# Patient Record
Sex: Female | Born: 1935 | Race: White | Hispanic: No | Smoking: Never smoker
Health system: Southern US, Community
[De-identification: ages and names within clinical notes are randomized; demographics above are authoritative.]

## PROBLEM LIST (undated history)

## (undated) DIAGNOSIS — E079 Disorder of thyroid, unspecified: Secondary | ICD-10-CM

## (undated) DIAGNOSIS — S069XAA Unspecified intracranial injury with loss of consciousness status unknown, initial encounter: Secondary | ICD-10-CM

## (undated) DIAGNOSIS — R55 Syncope and collapse: Secondary | ICD-10-CM

## (undated) DIAGNOSIS — R569 Unspecified convulsions: Secondary | ICD-10-CM

## (undated) DIAGNOSIS — W19XXXA Unspecified fall, initial encounter: Secondary | ICD-10-CM

## (undated) DIAGNOSIS — F0390 Unspecified dementia without behavioral disturbance: Secondary | ICD-10-CM

## (undated) DIAGNOSIS — S42309A Unspecified fracture of shaft of humerus, unspecified arm, initial encounter for closed fracture: Secondary | ICD-10-CM

## (undated) DIAGNOSIS — N289 Disorder of kidney and ureter, unspecified: Secondary | ICD-10-CM

## (undated) DIAGNOSIS — B029 Zoster without complications: Secondary | ICD-10-CM

## (undated) DIAGNOSIS — E039 Hypothyroidism, unspecified: Secondary | ICD-10-CM

## (undated) DIAGNOSIS — I10 Essential (primary) hypertension: Secondary | ICD-10-CM

## (undated) DIAGNOSIS — Y92129 Unspecified place in nursing home as the place of occurrence of the external cause: Secondary | ICD-10-CM

## (undated) DIAGNOSIS — S069X9A Unspecified intracranial injury with loss of consciousness of unspecified duration, initial encounter: Secondary | ICD-10-CM

## (undated) HISTORY — PX: CATARACT EXTRACTION: SUR2

## (undated) HISTORY — PX: ABDOMINAL HYSTERECTOMY: SHX81

---

## 2007-07-20 ENCOUNTER — Ambulatory Visit: Payer: Self-pay | Admitting: Gastroenterology

## 2011-10-20 ENCOUNTER — Ambulatory Visit: Payer: Self-pay | Admitting: Ophthalmology

## 2011-10-20 LAB — POTASSIUM: Potassium: 3.9 mmol/L (ref 3.5–5.1)

## 2011-11-16 ENCOUNTER — Ambulatory Visit: Payer: Self-pay | Admitting: Ophthalmology

## 2012-08-24 ENCOUNTER — Emergency Department: Payer: Self-pay | Admitting: Internal Medicine

## 2014-05-20 ENCOUNTER — Inpatient Hospital Stay (HOSPITAL_COMMUNITY): Payer: Medicare Other

## 2014-05-20 ENCOUNTER — Inpatient Hospital Stay (HOSPITAL_COMMUNITY)
Admission: EM | Admit: 2014-05-20 | Discharge: 2014-05-25 | DRG: 083 | Disposition: A | Discharge status: Skilled Nursing Facility | Payer: Medicare Other | Attending: Surgery | Admitting: Surgery

## 2014-05-20 ENCOUNTER — Encounter (HOSPITAL_COMMUNITY): Payer: Self-pay | Admitting: Emergency Medicine

## 2014-05-20 ENCOUNTER — Emergency Department (HOSPITAL_COMMUNITY): Payer: Medicare Other

## 2014-05-20 DIAGNOSIS — E039 Hypothyroidism, unspecified: Secondary | ICD-10-CM | POA: Diagnosis present

## 2014-05-20 DIAGNOSIS — S02109A Fracture of base of skull, unspecified side, initial encounter for closed fracture: Secondary | ICD-10-CM | POA: Diagnosis present

## 2014-05-20 DIAGNOSIS — W19XXXA Unspecified fall, initial encounter: Secondary | ICD-10-CM | POA: Diagnosis present

## 2014-05-20 DIAGNOSIS — S066X9A Traumatic subarachnoid hemorrhage with loss of consciousness of unspecified duration, initial encounter: Secondary | ICD-10-CM | POA: Diagnosis present

## 2014-05-20 DIAGNOSIS — S0511XA Contusion of eyeball and orbital tissues, right eye, initial encounter: Secondary | ICD-10-CM | POA: Diagnosis not present

## 2014-05-20 DIAGNOSIS — S0292XA Unspecified fracture of facial bones, initial encounter for closed fracture: Secondary | ICD-10-CM

## 2014-05-20 DIAGNOSIS — S0003XA Contusion of scalp, initial encounter: Secondary | ICD-10-CM | POA: Diagnosis not present

## 2014-05-20 DIAGNOSIS — S0181XA Laceration without foreign body of other part of head, initial encounter: Secondary | ICD-10-CM | POA: Diagnosis not present

## 2014-05-20 DIAGNOSIS — R40241 Glasgow coma scale score 13-15: Secondary | ICD-10-CM | POA: Diagnosis present

## 2014-05-20 DIAGNOSIS — S069X9A Unspecified intracranial injury with loss of consciousness of unspecified duration, initial encounter: Secondary | ICD-10-CM | POA: Diagnosis present

## 2014-05-20 DIAGNOSIS — S065X1A Traumatic subdural hemorrhage with loss of consciousness of 30 minutes or less, initial encounter: Secondary | ICD-10-CM

## 2014-05-20 DIAGNOSIS — S0101XA Laceration without foreign body of scalp, initial encounter: Secondary | ICD-10-CM | POA: Diagnosis not present

## 2014-05-20 DIAGNOSIS — S069X4D Unspecified intracranial injury with loss of consciousness of 6 hours to 24 hours, subsequent encounter: Secondary | ICD-10-CM | POA: Diagnosis not present

## 2014-05-20 DIAGNOSIS — I10 Essential (primary) hypertension: Secondary | ICD-10-CM | POA: Diagnosis present

## 2014-05-20 DIAGNOSIS — S0210XA Unspecified fracture of base of skull, initial encounter for closed fracture: Secondary | ICD-10-CM | POA: Diagnosis present

## 2014-05-20 DIAGNOSIS — R4701 Aphasia: Secondary | ICD-10-CM | POA: Diagnosis present

## 2014-05-20 DIAGNOSIS — S069XAA Unspecified intracranial injury with loss of consciousness status unknown, initial encounter: Secondary | ICD-10-CM | POA: Diagnosis present

## 2014-05-20 HISTORY — DX: Disorder of thyroid, unspecified: E07.9

## 2014-05-20 HISTORY — DX: Zoster without complications: B02.9

## 2014-05-20 HISTORY — DX: Essential (primary) hypertension: I10

## 2014-05-20 LAB — COMPREHENSIVE METABOLIC PANEL
ALBUMIN: 3.6 g/dL (ref 3.5–5.2)
ALT: 28 U/L (ref 0–35)
ANION GAP: 5 (ref 5–15)
AST: 46 U/L — AB (ref 0–37)
Alkaline Phosphatase: 46 U/L (ref 39–117)
BUN: 18 mg/dL (ref 6–23)
CALCIUM: 9.1 mg/dL (ref 8.4–10.5)
CO2: 30 mmol/L (ref 19–32)
Chloride: 102 mmol/L (ref 96–112)
Creatinine, Ser: 1.25 mg/dL — ABNORMAL HIGH (ref 0.50–1.10)
GFR calc Af Amer: 43 mL/min — ABNORMAL LOW (ref >90)
GFR, EST NON AFRICAN AMERICAN: 37 mL/min — AB (ref >90)
Glucose, Bld: 120 mg/dL — ABNORMAL HIGH (ref 70–99)
Potassium: 4 mmol/L (ref 3.5–5.1)
SODIUM: 137 mmol/L (ref 135–145)
Total Bilirubin: 0.3 mg/dL (ref 0.3–1.2)
Total Protein: 6.3 g/dL (ref 6.0–8.3)

## 2014-05-20 LAB — TYPE AND SCREEN
ABO/RH(D): O POS
ANTIBODY SCREEN: NEGATIVE
Unit division: 0
Unit division: 0

## 2014-05-20 LAB — CBC
HCT: 40 % (ref 36.0–46.0)
Hemoglobin: 13 g/dL (ref 12.0–15.0)
MCH: 29.7 pg (ref 26.0–34.0)
MCHC: 32.5 g/dL (ref 30.0–36.0)
MCV: 91.5 fL (ref 78.0–100.0)
PLATELETS: 264 10*3/uL (ref 150–400)
RBC: 4.37 MIL/uL (ref 3.87–5.11)
RDW: 14 % (ref 11.5–15.5)
WBC: 8.5 10*3/uL (ref 4.0–10.5)

## 2014-05-20 LAB — I-STAT CHEM 8, ED
BUN: 21 mg/dL (ref 6–23)
CALCIUM ION: 1.17 mmol/L (ref 1.13–1.30)
Chloride: 100 mmol/L (ref 96–112)
Creatinine, Ser: 1.2 mg/dL — ABNORMAL HIGH (ref 0.50–1.10)
GLUCOSE: 119 mg/dL — AB (ref 70–99)
HCT: 43 % (ref 36.0–46.0)
Hemoglobin: 14.6 g/dL (ref 12.0–15.0)
Potassium: 4 mmol/L (ref 3.5–5.1)
SODIUM: 139 mmol/L (ref 135–145)
TCO2: 24 mmol/L (ref 0–100)

## 2014-05-20 LAB — MRSA PCR SCREENING: MRSA BY PCR: NEGATIVE

## 2014-05-20 LAB — ABO/RH: ABO/RH(D): O POS

## 2014-05-20 LAB — I-STAT CG4 LACTIC ACID, ED: Lactic Acid, Venous: 1.52 mmol/L (ref 0.5–2.0)

## 2014-05-20 LAB — PROTIME-INR
INR: 1.06 (ref 0.00–1.49)
Prothrombin Time: 13.9 seconds (ref 11.6–15.2)

## 2014-05-20 LAB — ETHANOL

## 2014-05-20 LAB — CDS SEROLOGY

## 2014-05-20 MED ORDER — ONDANSETRON HCL 4 MG/2ML IJ SOLN
4.0000 mg | Freq: Once | INTRAMUSCULAR | Status: AC
  Administered 2014-05-20: 4 mg via INTRAVENOUS

## 2014-05-20 MED ORDER — MORPHINE SULFATE 2 MG/ML IJ SOLN: 2.0000 mg | INTRAMUSCULAR | Status: DC | PRN

## 2014-05-20 MED ORDER — FLUORESCEIN SODIUM 1 MG OP STRP: 1.0000 | ORAL_STRIP | Freq: Once | OPHTHALMIC | Status: DC

## 2014-05-20 MED ORDER — ONDANSETRON HCL 4 MG/2ML IJ SOLN
4.0000 mg | Freq: Four times a day (QID) | INTRAMUSCULAR | Status: DC | PRN
  Administered 2014-05-20: 4 mg via INTRAVENOUS
  Filled 2014-05-20: qty 2

## 2014-05-20 MED ORDER — TETANUS-DIPHTH-ACELL PERTUSSIS 5-2.5-18.5 LF-MCG/0.5 IM SUSP
0.5000 mL | Freq: Once | INTRAMUSCULAR | Status: AC
  Administered 2014-05-20: 0.5 mL via INTRAMUSCULAR
  Filled 2014-05-20: qty 0.5

## 2014-05-20 MED ORDER — PANTOPRAZOLE SODIUM 40 MG PO TBEC
40.0000 mg | DELAYED_RELEASE_TABLET | Freq: Every day | ORAL | Status: DC
  Administered 2014-05-24 – 2014-05-25 (×2): 40 mg via ORAL
  Filled 2014-05-20 (×2): qty 1

## 2014-05-20 MED ORDER — PANTOPRAZOLE SODIUM 40 MG IV SOLR
40.0000 mg | Freq: Every day | INTRAVENOUS | Status: DC
  Administered 2014-05-20 – 2014-05-23 (×4): 40 mg via INTRAVENOUS
  Filled 2014-05-20 (×6): qty 40

## 2014-05-20 MED ORDER — TETRACAINE HCL 0.5 % OP SOLN
2.0000 [drp] | Freq: Once | OPHTHALMIC | Status: AC
  Administered 2014-05-20: 2 [drp] via OPHTHALMIC
  Filled 2014-05-20: qty 2

## 2014-05-20 MED ORDER — MORPHINE SULFATE 4 MG/ML IJ SOLN: 4.0000 mg | INTRAMUSCULAR | Status: DC | PRN

## 2014-05-20 MED ORDER — POTASSIUM CHLORIDE IN NACL 20-0.9 MEQ/L-% IV SOLN
INTRAVENOUS | Status: DC
  Administered 2014-05-20 – 2014-05-23 (×5): via INTRAVENOUS
  Filled 2014-05-20 (×9): qty 1000

## 2014-05-20 MED ORDER — ONDANSETRON HCL 4 MG PO TABS: 4.0000 mg | ORAL_TABLET | Freq: Four times a day (QID) | ORAL | Status: DC | PRN

## 2014-05-20 NOTE — H&P (Addendum)
Tracy Reeves is an 81 y.o. female.   Chief Complaint: fall  HPI: Tracy Reeves was found down at the bottom of 3 steps in her car port. She was unresponsive when found. EMS was called and they transported her as a level one trauma. On arrival, she is arousable and answers questions. She is unable to contribute much to her past medical or past surgical history.  No past medical history on file.  No past surgical history on file.  No family history on file. Social History:  has no tobacco, alcohol, and drug history on file.  Allergies: Allergies not on file   (Not in a hospital admission)  Results for orders placed or performed during the hospital encounter of 05/20/14 (from the past 48 hour(s))  Type and screen     Status: None (Preliminary result)   Collection Time: 05/20/14  3:35 PM  Result Value Ref Range   ABO/RH(D) PENDING    Antibody Screen PENDING    Sample Expiration 05/23/2014    Unit Number [id number]    Blood Component Type RED CELLS,LR    Unit division 00    Status of Unit ISSUED    Unit tag comment VERBAL ORDERS PER DR WOFFORD    Transfusion Status OK TO TRANSFUSE    Crossmatch Result PENDING    Unit Number [id number]    Blood Component Type RED CELLS,LR    Unit division 00    Status of Unit ISSUED    Unit tag comment VERBAL ORDERS PER DR [id number]    Transfusion Status OK TO TRANSFUSE    Crossmatch Result PENDING   Prepare fresh frozen plasma     Status: None (Preliminary result)   Collection Time: 05/20/14  3:35 PM  Result Value Ref Range   Unit Number [id number]    Blood Component Type THW PLS APHR    Unit division 00    Status of Unit ISSUED    Transfusion Status OK TO TRANSFUSE    Unit Number [id number]    Blood Component Type THAWED PLASMA    Unit division 00    Status of Unit ISSUED    Transfusion Status OK TO TRANSFUSE   CBC     Status: None   Collection Time: 05/20/14  4:01 PM  Result Value Ref Range   WBC 8.5 4.0 - 10.5 K/uL   RBC 4.37  3.87 - 5.11 MIL/uL   Hemoglobin 13.0 12.0 - 15.0 g/dL   HCT 40.0 36.0 - 46.0 %   MCV 91.5 78.0 - 100.0 fL   MCH 29.7 26.0 - 34.0 pg   MCHC 32.5 30.0 - 36.0 g/dL   RDW 14.0 11.5 - 15.5 %   Platelets 264 150 - 400 K/uL  I-stat chem 8, ed     Status: Abnormal   Collection Time: 05/20/14  4:11 PM  Result Value Ref Range   Sodium 139 135 - 145 mmol/L   Potassium 4.0 3.5 - 5.1 mmol/L   Chloride 100 96 - 112 mmol/L   BUN 21 6 - 23 mg/dL   Creatinine, Ser 1.20 (H) 0.50 - 1.10 mg/dL   Glucose, Bld 119 (H) 70 - 99 mg/dL   Calcium, Ion 1.17 1.13 - 1.30 mmol/L   TCO2 24 0 - 100 mmol/L   Hemoglobin 14.6 12.0 - 15.0 g/dL   HCT 43.0 36.0 - 46.0 %  I-Stat CG4 Lactic Acid, ED     Status: None   Collection Time: 05/20/14  4:12 PM  Result Value Ref  Range   Lactic Acid, Venous 1.52 0.5 - 2.0 mmol/L   Dg Pelvis Portable  05/20/2014   CLINICAL DATA:  Level 1 trauma  EXAM: PORTABLE PELVIS 1-2 VIEWS  COMPARISON:  None.  FINDINGS: Degenerative changes in the hips bilaterally with joint space narrowing and spurring. Degenerative spurring scratch head degenerative changes in the visualized lower lumbar spine. No fracture, subluxation or dislocation.  IMPRESSION: No acute bony abnormality.   Electronically Signed   By: Tracy Reeves M.D.   On: 05/20/2014 16:44   Dg Chest Portable 1 View  05/20/2014   CLINICAL DATA:  Level 1 trauma.  EXAM: PORTABLE CHEST - 1 VIEW  COMPARISON:  None.  FINDINGS: Elevation of the right hemidiaphragm. Low lung volumes. Heart is borderline in size. No confluent airspace opacities. No effusions. No acute bony abnormality.  IMPRESSION: Elevated right hemidiaphragm.  No acute findings.   Electronically Signed   By: Tracy Reeves M.D.   On: 05/20/2014 16:44    Review of Systems  Unable to perform ROS: mental status change    Blood pressure 151/61, pulse 76, temperature 98.7 F (37.1 C), temperature source Oral, resp. rate 18, height 5\' 2"  (1.575 m), weight 145 lb (65.772 kg), SpO2 100  %. Physical Exam  Constitutional: She appears well-developed and well-nourished.  HENT:  Head: Head is with contusion and with laceration.    Right Ear: Tympanic membrane, external ear and ear canal normal. No hemotympanum.  Left Ear: Tympanic membrane, external ear and ear canal normal. No hemotympanum.  Nose: No sinus tenderness or nasal deformity.  Mouth/Throat: Uvula is midline and oropharynx is clear and moist.  Large right scalp hematoma with abrasion, 1 cm right forehead laceration, significant right periorbital ecchymosis with some proptosis  Eyes: EOM are normal. Pupils are equal, round, and reactive to light. Right eye exhibits no discharge. Left eye exhibits no discharge.  Right periorbital ecchymosis as above, right proptosis, vision seems grossly intact in both eyes but she does not answer questions well, small right subconjunctival hemorrhage  Neck: No tracheal deviation present.  No posterior midline tenderness, no pain on active range of motion  Cardiovascular: Normal rate, regular rhythm and normal heart sounds.   Respiratory: Effort normal and breath sounds normal. No stridor. No respiratory distress. She has no wheezes. She has no rales.  GI: Soft. Bowel sounds are normal. She exhibits no distension. There is no tenderness. There is no rebound and no guarding.  Musculoskeletal: Normal range of motion. She exhibits no edema or tenderness.  Neurological: She displays no atrophy and no tremor. She exhibits normal muscle tone. She displays no seizure activity. GCS eye subscore is 3. GCS verbal subscore is 4. GCS motor subscore is 6.  Opens eyes to voice, speech is confused at times, follows commands and moves all 4 extremities   Skin: Skin is warm.     Assessment/Plan Fall TBI/right frontal ICC/L parietal ICC/L temporal ICC at middle cranial fossa - Dr. Kathyrn Reeves to see in consultation R max sinus and skull base FXs - check maxillofacial CT, Dr. Migdalia Reeves to see in AM Skull  base FX with associated hematoma at R orbital apex - opthalmology consult R forehead lac and scalp hematoma - lac closed with LiquiBand  Admit to ICU. Will try to get PMHx from family. Critical care 87min Tracy Reeves E 05/20/2014, 4:58 PM

## 2014-05-20 NOTE — Progress Notes (Signed)
Patient ID: Tracy Reeves, female   DOB: February 20, 1923, 81 y.o.   MRN: [id number] B eye pressures checked by EDP resident: R15 L14. I D/W Dr. Eddie North from optho and she said nothing needs to be done at this time. [name], MD, MPH, FACS Trauma: [id number] General Surgery: [phone]

## 2014-05-20 NOTE — ED Provider Notes (Signed)
CSN: [id number]     Arrival date & time 05/20/14  1553 History   First MD Initiated Contact with Patient 05/20/14 [phone]     Chief Complaint  Patient presents with  . Fall    level 1 trauma     (Consider location/radiation/quality/duration/timing/severity/associated sxs/prior Treatment) HPI Comments: 81 yo F with unknown PMHx who presents as a Level I trauma code following fall. Per EMS, the patient was found down at the bottom of 3 stairs in her carport. Per EMS, patient was minimally responsive at that time but breathing spontaneously, with GCS 4-6 en route. There was moderate amount of blood loss at the scene. On arrival, patient able to answer her name but unable to provide further history.  Patient is a 81 y.o. female presenting with trauma. The history is provided by the EMS personnel.  Trauma Mechanism of injury: fall Injury location: head/neck Injury location detail: head and scalp Incident location: home   Fall:      Fall occurred: From 3 steps.      Impact surface: hard floor      Point of impact: head  EMS/PTA data:      Bystander interventions: none      Ambulatory at scene: no      Blood loss: moderate      Responsiveness: unresponsive      Loss of consciousness: yes  Current symptoms:      Associated symptoms:            Reports loss of consciousness.    Past Medical History  Diagnosis Date  . Hypertension   . Shingles    History reviewed. No pertinent past surgical history. No family history on file. History  Substance Use Topics  . Smoking status: Never Smoker   . Smokeless tobacco: Not on file  . Alcohol Use: Not on file   OB History    No data available     Review of Systems  Unable to perform ROS: Mental status change  Neurological: Positive for loss of consciousness.      Allergies  Review of patient's allergies indicates no known allergies.  Home Medications   Prior to Admission medications   Medication Sig Start Date End Date  Taking? Authorizing Provider  amLODipine (NORVASC) 5 MG tablet Take 5 mg by mouth daily.   Yes Historical Provider, MD  levothyroxine (SYNTHROID, LEVOTHROID) 25 MCG tablet Take 25 mcg by mouth daily before breakfast.   Yes Historical Provider, MD  LORazepam (ATIVAN) 0.5 MG tablet Take 0.5 mg by mouth 3 (three) times daily as needed for anxiety.   Yes Historical Provider, MD  losartan (COZAAR) 50 MG tablet Take 50 mg by mouth daily.   Yes Historical Provider, MD  nystatin-triamcinolone (MYCOLOG II) cream Apply 1 application topically 3 (three) times daily as needed (vaginal burning).   Yes Historical Provider, MD  PARoxetine (PAXIL) 10 MG tablet Take 10 mg by mouth at bedtime.   Yes Historical Provider, MD  traMADol (ULTRAM) 50 MG tablet Take 50 mg by mouth every 6 (six) hours as needed (pain).   Yes Historical Provider, MD   BP 137/60 mmHg  Pulse 90  Temp(Src) 99.3 F (37.4 C) (Axillary)  Resp 15  Ht 5' (1.524 m)  Wt 119 lb 0.8 oz (54 kg)  BMI 23.25 kg/m2  SpO2 100% Physical Exam  Constitutional: She appears well-developed and well-nourished. Cervical collar and backboard in place.  HENT:  Small laceration noted to the right temporal scalp with  associated bogginess. No active bleeding. No occipital bogginess or step-offs. Midface stable. Bilateral periorbital ecchymoses noted. No periauricular ecchymoses. Dried blood in bilateral nares with no nasal septal hematoma. No hemotympanum bilaterally. No apparent dental trauma. No proptosis. Small amount of blood noted in lateral lid margin. No active bleeding. PERRL.   Eyes: Conjunctivae are normal. Pupils are equal, round, and reactive to light.  Neck: No tracheal deviation present.  Hard cervical collar in place  Cardiovascular: Normal rate and normal heart sounds.  Exam reveals no friction rub.   No murmur heard. Pulmonary/Chest: Effort normal and breath sounds normal. No respiratory distress. She has no wheezes. She has no rales.  No chest  wall tenderness to palpation. No bruising or ecchymosis.  Abdominal: Soft. She exhibits no distension. There is no tenderness.  Musculoskeletal: She exhibits no edema.  Pelvis stable to AP and lateral compression. No apparent trauma or deformity to the upper or lower extremities bilaterally  Neurological: She is alert. She displays no tremor. She exhibits normal muscle tone. She displays no seizure activity. GCS eye subscore is 3. GCS verbal subscore is 4. GCS motor subscore is 6.  Skin: Skin is warm. No rash noted.    ED Course  Procedures (including critical care time) Labs Review Labs Reviewed  COMPREHENSIVE METABOLIC PANEL - Abnormal; Notable for the following:    Glucose, Bld 120 (*)    Creatinine, Ser 1.25 (*)    AST 46 (*)    GFR calc non Af Amer 37 (*)    GFR calc Af Amer 43 (*)    All other components within normal limits  I-STAT CHEM 8, ED - Abnormal; Notable for the following:    Creatinine, Ser 1.20 (*)    Glucose, Bld 119 (*)    All other components within normal limits  MRSA PCR SCREENING  CDS SEROLOGY  CBC  ETHANOL  PROTIME-INR  CBC  PROTIME-INR  BASIC METABOLIC PANEL  I-STAT CG4 LACTIC ACID, ED  TYPE AND SCREEN  PREPARE FRESH FROZEN PLASMA  ABO/RH    Imaging Review Ct Head Wo Contrast  05/20/2014   CLINICAL DATA:  Fall down 3 steps in her garage, landed head 1st on concrete floor, found unresponsive, right head laceration with bruising to right eye  EXAM: CT HEAD WITHOUT CONTRAST  CT CERVICAL SPINE WITHOUT CONTRAST  TECHNIQUE: Multidetector CT imaging of the head and cervical spine was performed following the standard protocol without intravenous contrast. Multiplanar CT image reconstructions of the cervical spine were also generated.  COMPARISON:  None.  FINDINGS: CT HEAD FINDINGS  Extracranial hematoma overlying the right frontal bone (series 3/image 22).  Underlying parenchymal contusion/subarachnoid hemorrhage in the right frontal lobe (series 3/image 20)  with associated parenchymal contusion in the left parietal lobe (series 3/ image 17).  Additional subarachnoid hemorrhage in the left frontal lobe (series 3/ image 19).  Additional layering hemorrhage/parenchymal contusion in the left upper lobe/ left middle cranial fossa (series 3/images 8-9).  Layering fluid/ hemorrhage in the right maxillary sinus (series 3/image 3). Suspected associated nondisplaced fracture involving the anterior wall of the right maxillary sinus (series 4/image 4).  Additional hemorrhage in the right orbital apex (series 3/ image 6) with associated proptosis of the right globe. Associated fracture involving the zygomatic arch. Fracture involving the right frontal bone (series 4/image 41) and extending through the greater wing of the sphenoid (series 4/ images 10-14).  Involvement of the bilateral sphenoid sinuses with associated hemorrhage.  No mass lesion, mass effect,  or midline shift.  No CT evidence of acute infarction.  Basilar cisterns are patent.  Small vessel ischemic changes.  Intracranial atherosclerosis.  Cerebral volume is age appropriate.  No ventriculomegaly.  No evidence of intraventricular hemorrhage.  CT CERVICAL SPINE FINDINGS  Normal cervical lordosis.  No evidence of fracture or dislocation. Vertebral body heights are maintained. Dens appears intact.  No prevertebral soft tissue swelling.  4 mm anterolisthesis of C4 on C5.  3 mm anterolisthesis of C5 on C6.  Moderate multilevel degenerative changes.  IMPRESSION: Hematoma in the right orbital apex with associated proptosis.  Fractures involving the right zygomatic arch, right frontal bone, and extending through the greater wing of the sphenoid. Associated fractures involving the right maxillary sinus and bilateral sphenoid sinuses.  Extracranial hematoma overlying the right frontal bone.  Underlying parenchymal contusion in the right frontal lobe, left parietal lobe, and left temporal lobe/middle cranial fossa. Additional  subarachnoid hemorrhage in the left frontal lobe.  No evidence of acute traumatic injury to the cervical spine.  Critical Value/emergent results were discussed in person at the time of interpretation on 05/20/2014 at 4:35 pm to Dr. [name], who verbally acknowledged these results.   Electronically Signed   By: Julian Hy M.D.   On: 05/20/2014 17:05   Ct Cervical Spine Wo Contrast  05/20/2014   CLINICAL DATA:  Fall down 3 steps in her garage, landed head 1st on concrete floor, found unresponsive, right head laceration with bruising to right eye  EXAM: CT HEAD WITHOUT CONTRAST  CT CERVICAL SPINE WITHOUT CONTRAST  TECHNIQUE: Multidetector CT imaging of the head and cervical spine was performed following the standard protocol without intravenous contrast. Multiplanar CT image reconstructions of the cervical spine were also generated.  COMPARISON:  None.  FINDINGS: CT HEAD FINDINGS  Extracranial hematoma overlying the right frontal bone (series 3/image 22).  Underlying parenchymal contusion/subarachnoid hemorrhage in the right frontal lobe (series 3/image 20) with associated parenchymal contusion in the left parietal lobe (series 3/ image 17).  Additional subarachnoid hemorrhage in the left frontal lobe (series 3/ image 19).  Additional layering hemorrhage/parenchymal contusion in the left upper lobe/ left middle cranial fossa (series 3/images 8-9).  Layering fluid/ hemorrhage in the right maxillary sinus (series 3/image 3). Suspected associated nondisplaced fracture involving the anterior wall of the right maxillary sinus (series 4/image 4).  Additional hemorrhage in the right orbital apex (series 3/ image 6) with associated proptosis of the right globe. Associated fracture involving the zygomatic arch. Fracture involving the right frontal bone (series 4/image 41) and extending through the greater wing of the sphenoid (series 4/ images 10-14).  Involvement of the bilateral sphenoid sinuses with  associated hemorrhage.  No mass lesion, mass effect, or midline shift.  No CT evidence of acute infarction.  Basilar cisterns are patent.  Small vessel ischemic changes.  Intracranial atherosclerosis.  Cerebral volume is age appropriate.  No ventriculomegaly.  No evidence of intraventricular hemorrhage.  CT CERVICAL SPINE FINDINGS  Normal cervical lordosis.  No evidence of fracture or dislocation. Vertebral body heights are maintained. Dens appears intact.  No prevertebral soft tissue swelling.  4 mm anterolisthesis of C4 on C5.  3 mm anterolisthesis of C5 on C6.  Moderate multilevel degenerative changes.  IMPRESSION: Hematoma in the right orbital apex with associated proptosis.  Fractures involving the right zygomatic arch, right frontal bone, and extending through the greater wing of the sphenoid. Associated fractures involving the right maxillary sinus and bilateral sphenoid sinuses.  Extracranial hematoma  overlying the right frontal bone.  Underlying parenchymal contusion in the right frontal lobe, left parietal lobe, and left temporal lobe/middle cranial fossa. Additional subarachnoid hemorrhage in the left frontal lobe.  No evidence of acute traumatic injury to the cervical spine.  Critical Value/emergent results were discussed in person at the time of interpretation on 05/20/2014 at 4:35 pm to Dr. [name], who verbally acknowledged these results.   Electronically Signed   By: Julian Hy M.D.   On: 05/20/2014 17:05   Dg Pelvis Portable  05/20/2014   CLINICAL DATA:  Level 1 trauma  EXAM: PORTABLE PELVIS 1-2 VIEWS  COMPARISON:  None.  FINDINGS: Degenerative changes in the hips bilaterally with joint space narrowing and spurring. Degenerative spurring scratch head degenerative changes in the visualized lower lumbar spine. No fracture, subluxation or dislocation.  IMPRESSION: No acute bony abnormality.   Electronically Signed   By: Rolm Baptise M.D.   On: 05/20/2014 16:44   Dg Chest Portable 1  View  05/20/2014   CLINICAL DATA:  Level 1 trauma.  EXAM: PORTABLE CHEST - 1 VIEW  COMPARISON:  None.  FINDINGS: Elevation of the right hemidiaphragm. Low lung volumes. Heart is borderline in size. No confluent airspace opacities. No effusions. No acute bony abnormality.  IMPRESSION: Elevated right hemidiaphragm.  No acute findings.   Electronically Signed   By: Rolm Baptise M.D.   On: 05/20/2014 16:44   Ct Maxillofacial Wo Cm  05/20/2014   CLINICAL DATA:  Found down and unresponsive. Maxillofacial fractures noted on the CT of the head and cervical spine. Further evaluation requested. Initial encounter.  EXAM: CT MAXILLOFACIAL WITHOUT CONTRAST  TECHNIQUE: Multidetector CT imaging of the maxillofacial structures was performed. Multiplanar CT image reconstructions were also generated. A small metallic BB was placed on the right temple in order to reliably differentiate right from left.  COMPARISON:  None.  FINDINGS: As noted on recent CT of the head and cervical spine, there is a minimally displaced tetrapod fracture involving the right zygomaticomaxillary complex. The superior buttress fracture extends across the suture, while there is a mildly displaced fracture through the right orbital floor and superior aspect of the lateral wall of the right maxillary sinus. The fracture extends across the anterior wall of the right maxillary sinus. There is a minimally displaced fracture extending across the right zygomatic arch, and as described on prior CT, there is extension of a fracture line across the base of the skull, primarily on the right, though also extending to the left, passing through the sphenoid sinus on both sides. This does not appear to involve the more posterior vascular foramina.  There is right-sided proptosis. Underlying intraorbital hematoma and air is seen tracking along the posterior aspect of the orbit, measuring perhaps 1.9 x 0.5 x 0.8 cm, with poor characterization of the adjacent inferior  rectus muscle. The right optic globe appears grossly intact. The left orbit is unremarkable in appearance.  Blood is noted filling the right maxillary sinus and sphenoid sinus, and partially filling the nasal passages. The remaining paranasal sinuses and mastoid air cells are well aerated.  The mandible appears intact. The nasal bone is unremarkable in appearance. The visualized dentition demonstrates no acute abnormality.  Intraparenchymal hemorrhage at the left temporal lobe is better defined than on the prior study, and appears to have increased mildly in size, measuring 1.9 x 1.7 cm. Mild left-sided subdural blood is suggested along the tentorium cerebelli. Parenchymal contusion is again noted along the left parietal lobe.  Mild soft tissue swelling is noted about the right orbit, and overlying the right maxilla, with minimal soft tissue air overlying the right maxilla. The parapharyngeal fat planes are preserved. The nasopharynx, oropharynx and hypopharynx are unremarkable in appearance. The visualized portions of the valleculae and piriform sinuses are grossly unremarkable.  The parotid and visualized portions of the submandibular glands are within normal limits. No cervical lymphadenopathy is seen.  IMPRESSION: 1. Minimally displaced tetrapod fracture of the right zygomaticomaxillary complex, described in further detail above, with blood filling the right maxillary sinus and sphenoid sinus, and partially filling the nasal passages. 2. Fracture line extending across the base of the skull, more prominent on the right, though also extending to the left, passing through the sphenoid sinus on both sides. 3. Right-sided proptosis noted. This reflects underlying intraorbital hematoma and air tracking along the posterior aspect of the orbit, measuring approximately 1.9 x 0.5 x 0.8 cm, with poor characterization of the adjacent posterior aspect of the inferior rectus muscle, though the anterior aspect of the muscle  appears intact. 4. Intraparenchymal hemorrhage at the left temporal lobe is better defined, and appears to have increased mildly in size, measuring 1.9 x 1.7 cm. 5. Mild left-sided subdural blood noted along the tentorium cerebelli. 6. Parenchymal contusion again noted along the left parietal lobe. 7. Mild soft tissue swelling about the right orbit, and overlying the right maxilla, with minimal associated soft tissue air overlying the right maxilla.  These results were called by telephone at the time of interpretation on 05/20/2014 at 5:49 pm to Dr. Roxanne Gates, who verbally acknowledged these results.   Electronically Signed   By: Garald Balding M.D.   On: 05/20/2014 17:50     EKG Interpretation   Date/Time:  Sunday May 20 2014 16:00:12 EST Ventricular Rate:  74 PR Interval:  140 QRS Duration: 96 QT Interval:  387 QTC Calculation: 429 R Axis:   -10 Text Interpretation:  Sinus rhythm Borderline T wave abnormalities No old  tracing to compare Confirmed by [facility]  MD, TREY ([id number]) on 05/20/2014  5:51:11 PM      MDM   81 year old female with unknown past medical history who presents after unwitnessed fall and loss of consciousness from height of 3 feet. See HPI above. On arrival, primary survey intact - airway intact and protected, breath sounds equal BL, PIV x 2 established, normal manual BP, symmetric pulses, and GCS 13. Secondary as above, remarkable for multiple sites of trauma to the face and head with no apparent other trauma. Trauma Surgery at bedside on arrival and throughout evaluation. Will send for stat CT Head, C-Spine. Portable CXR unremarkable with no PTX.  CT C-Spine without acute fx or abnormality. CT Head with severe TBI with SAH, IPH, multiple facial fx,traumatic right orbital hematoma. Lac closed at bedside by Trauma. Ophtho, NSG consulted. IOP checked given retrobulbar hematoma with IOP 15 OD, 14 OS (averaged over 3 measurements). Trauma at bedside while checking IOP. Globe  soft and appears intact. Will admit to Trauma Surgery. Pt remains GCS 13-14, protecting airway, with no indication for intubation at this time. BP controlled. Labs reviewed as above. INR normal, platelets WNL, no known anticoagulant use.  Clinical Impression: 1. Fall, initial encounter   2. Facial fracture due to fall   3. TBI (traumatic brain injury)   4. Subdural hematoma, post-traumatic, with loss of consciousness of 30 minutes or less, initial encounter   5. Traumatic orbital hematoma, right, initial encounter   6. Multiple  facial fractures, closed, initial encounter   7. Scalp laceration, initial encounter   8. Scalp hematoma, initial encounter   9. Traumatic subarachnoid hemorrhage with loss of consciousness, unspecified duration, initial encounter     Disposition: Admit  Condition: Stable  Pt seen in conjunction with Dr. Cleone Slim, MD 05/21/14 0109  Houston Siren III, MD 05/21/14 [phone]

## 2014-05-20 NOTE — ED Notes (Addendum)
Pt returned from CT. Pt vomited coming back to room. Pt cleaned and changed. 4mg  zofran given IV per verbal order by ER resident.

## 2014-05-20 NOTE — ED Notes (Signed)
Pt transported to CT ?

## 2014-05-20 NOTE — ED Notes (Signed)
Family at bedside. sts pt was dx with Shingles 5 weeks ago.

## 2014-05-20 NOTE — Progress Notes (Signed)
Spoke with Dr. Grandville Silos about pt vomiting. No new orders given. Will continue to monitor.

## 2014-05-20 NOTE — ED Notes (Signed)
Chaplin given family contact info.

## 2014-05-20 NOTE — ED Provider Notes (Addendum)
I saw and evaluated the patient, reviewed the resident's note and I agree with the findings and plan.   EKG Interpretation   Date/Time:  Sunday May 20 2014 16:00:12 EST Ventricular Rate:  74 PR Interval:  140 QRS Duration: 96 QT Interval:  387 QTC Calculation: 429 R Axis:   -10 Text Interpretation:  Sinus rhythm Borderline T wave abnormalities No old  tracing to compare Confirmed by [facility]  MD, TREY ([id number]) on 05/20/2014  5:51:11 PM        CRITICAL CARE Performed by: Arbie Cookey   Total critical care time: 35  Critical care time was exclusive of separately billable procedures and treating other patients.  Critical care was necessary to treat or prevent imminent or life-threatening deterioration.  Critical care was time spent personally by me on the following activities: development of treatment plan with patient and/or surrogate as well as nursing, discussions with consultants, evaluation of patient's response to treatment, examination of patient, obtaining history from patient or surrogate, ordering and performing treatments and interventions, ordering and review of laboratory studies, ordering and review of radiographic studies, pulse oximetry and re-evaluation of patient's condition.   81 yo female presenting as level I trauma secondary to unwitnessed fall with decreased GCS.  On exam, ABCs intact, GCS 13 (3,4,6), lungs CTAB, heart sounds normal with RRR, abd soft an nontender, head with contusion and laceration to right parietal region.  Plan CT head/C Spine.    CT showed intracranial hemorrhage, facial and skull fractures.  Trauma evaluated from initial arrival.  NSU and Ophtho also consulted.  Admitted.    Clinical Impression: 1. Facial fracture due to fall   2. Facial fracture due to fall   3. TBI (traumatic brain injury)   Intracranial hemorrhage Retrobulbar hematoma    Houston Siren III, MD 05/20/14 [place] III,  MD 05/21/14 [phone]

## 2014-05-20 NOTE — Consult Note (Signed)
CC:  Chief Complaint  Patient presents with  . Fall    level 1 trauma    HPI: Tracy Reeves is a 81 y.o. female seen in the ICU after suffering a fall at home. History is obtained from the EMR as the patient is confused, and unable to provide meaningful history. She apparently was found down at her home at the bottom of 3 steps. She was unresponsive upon EMS arrival, but was awake and answering some questions in the ED.  PMH: Past Medical History  Diagnosis Date  . Hypertension   . Shingles     PSH: History reviewed. No pertinent past surgical history.  SH: History  Substance Use Topics  . Smoking status: Not on file  . Smokeless tobacco: Not on file  . Alcohol Use: Not on file    MEDS: Prior to Admission medications   Medication Sig Start Date End Date Taking? Authorizing Provider  amLODipine (NORVASC) 5 MG tablet Take 5 mg by mouth daily.   Yes Historical Provider, MD  levothyroxine (SYNTHROID, LEVOTHROID) 25 MCG tablet Take 25 mcg by mouth daily before breakfast.   Yes Historical Provider, MD  LORazepam (ATIVAN) 0.5 MG tablet Take 0.5 mg by mouth 3 (three) times daily as needed for anxiety.   Yes Historical Provider, MD  losartan (COZAAR) 50 MG tablet Take 50 mg by mouth daily.   Yes Historical Provider, MD  nystatin-triamcinolone (MYCOLOG II) cream Apply 1 application topically 3 (three) times daily as needed (vaginal burning).   Yes Historical Provider, MD  PARoxetine (PAXIL) 10 MG tablet Take 10 mg by mouth at bedtime.   Yes Historical Provider, MD  traMADol (ULTRAM) 50 MG tablet Take 50 mg by mouth every 6 (six) hours as needed (pain).   Yes Historical Provider, MD    ALLERGY: No Known Allergies  ROS:  Review of Systems  Unable to perform ROS: mental status change    NEUROLOGIC EXAM: Awake, alert, not oriented to hospital nor year GCS [id number] CN grossly intact Motor exam: Upper Extremities Deltoid Bicep Tricep Grip  Right 5/5 5/5 5/5 5/5  Left 5/5 5/5 5/5  5/5   Lower Extremity IP Quad PF DF EHL  Right 5/5 5/5 5/5 5/5 5/5  Left 5/5 5/5 5/5 5/5 5/5   Sensation grossly intact to LT  IMGAING: CTH demonstrates left inferior temporal contusions, and small amount of bilateral convexity SAH. No HCP. There is a non-displaced right temporal bone fracture.  CT Cspine does not demonstrate any acute fractures / subluxations  IMPRESSION: - 81 y.o. female s/p fall with left temporal contusions  PLAN: - Repeat CTH in the am - Cont observation in ICU

## 2014-05-20 NOTE — ED Notes (Signed)
Pt returned to CT. 

## 2014-05-20 NOTE — Progress Notes (Signed)
Pt on 3L [place]. Denies SOB. SPO2 97%. RT will continue to monitor.

## 2014-05-21 ENCOUNTER — Inpatient Hospital Stay (HOSPITAL_COMMUNITY): Payer: Medicare Other

## 2014-05-21 ENCOUNTER — Encounter (HOSPITAL_COMMUNITY): Payer: Self-pay | Admitting: Physician Assistant

## 2014-05-21 LAB — CBC
HEMATOCRIT: 39.9 % (ref 36.0–46.0)
HEMOGLOBIN: 13.1 g/dL (ref 12.0–15.0)
MCH: 30.2 pg (ref 26.0–34.0)
MCHC: 32.8 g/dL (ref 30.0–36.0)
MCV: 91.9 fL (ref 78.0–100.0)
Platelets: 212 10*3/uL (ref 150–400)
RBC: 4.34 MIL/uL (ref 3.87–5.11)
RDW: 13.9 % (ref 11.5–15.5)
WBC: 14.2 10*3/uL — ABNORMAL HIGH (ref 4.0–10.5)

## 2014-05-21 LAB — BASIC METABOLIC PANEL
ANION GAP: 12 (ref 5–15)
BUN: 13 mg/dL (ref 6–23)
CHLORIDE: 101 mmol/L (ref 96–112)
CO2: 21 mmol/L (ref 19–32)
CREATININE: 0.98 mg/dL (ref 0.50–1.10)
Calcium: 8.5 mg/dL (ref 8.4–10.5)
GFR, EST AFRICAN AMERICAN: 58 mL/min — AB (ref >90)
GFR, EST NON AFRICAN AMERICAN: 50 mL/min — AB (ref >90)
Glucose, Bld: 205 mg/dL — ABNORMAL HIGH (ref 70–99)
Potassium: 4.1 mmol/L (ref 3.5–5.1)
Sodium: 134 mmol/L — ABNORMAL LOW (ref 135–145)

## 2014-05-21 LAB — PREPARE FRESH FROZEN PLASMA
Unit division: 0
Unit division: 0

## 2014-05-21 LAB — PROTIME-INR
INR: 1.1 (ref 0.00–1.49)
Prothrombin Time: 14.4 seconds (ref 11.6–15.2)

## 2014-05-21 MED ORDER — CETYLPYRIDINIUM CHLORIDE 0.05 % MT LIQD
7.0000 mL | Freq: Two times a day (BID) | OROMUCOSAL | Status: DC
  Administered 2014-05-21 (×2): 7 mL via OROMUCOSAL

## 2014-05-21 NOTE — Progress Notes (Signed)
Speech Language Pathology  Patient Details Name: Tracy Reeves MRN: [id number] DOB: June 07, 1922 Today's Date: 05/21/2014 Time:  -       Received order for swallow cognitive assessment. Will initiate 3/1   Houston Siren M.Ed [employer] [phone]

## 2014-05-21 NOTE — Progress Notes (Signed)
Trauma Service Note  Subjective: Patient doing okay.  Agitated and not oriented.  Will not follow commands.  Objective: Vital signs in last 24 hours: Temp:  [98.7 F (37.1 C)-100.4 F (38 C)] 100.4 F (38 C) (02/29 0350) Pulse Rate:  [75-92] 89 (02/29 0800) Resp:  [14-30] 17 (02/29 0800) BP: (122-156)/(50-80) 139/58 mmHg (02/29 0800) SpO2:  [91 %-100 %] 99 % (02/29 0800) Weight:  [54 kg (119 lb 0.8 oz)-65.772 kg (145 lb)] 54 kg (119 lb 0.8 oz) (02/28 2000)    Intake/Output from previous day: 02/28 0701 - 02/29 0700 In: 732.5 [I.V.:732.5] Out: 1700 [Urine:1700] Intake/Output this shift: Total I/O In: 75 [I.V.:75] Out: -   General: No aucte distress but agitated and disoriented.  Lungs: Clear  Abd: Benign and soft  Extremities: No clinical signs or symptoms of DVT  Neuro: Intact  Lab Results: CBC   Recent Labs  05/20/14 1601 05/20/14 1611 05/21/14 0244  WBC 8.5  --  14.2*  HGB 13.0 14.6 13.1  HCT 40.0 43.0 39.9  PLT 264  --  212   BMET  Recent Labs  05/20/14 1601 05/20/14 1611 05/21/14 0244  NA 137 139 134*  K 4.0 4.0 4.1  CL 102 100 101  CO2 30  --  21  GLUCOSE 120* 119* 205*  BUN 18 21 13   CREATININE 1.25* 1.20* 0.98  CALCIUM 9.1  --  8.5   PT/INR  Recent Labs  05/20/14 1601 05/21/14 0244  LABPROT 13.9 14.4  INR 1.06 1.10   ABG No results for input(s): PHART, HCO3 in the last 72 hours.  Invalid input(s): PCO2, PO2  Studies/Results: Ct Head Without Contrast  05/21/2014   CLINICAL DATA:  Followup traumatic brain injury.  EXAM: CT HEAD WITHOUT CONTRAST  TECHNIQUE: Contiguous axial images were obtained from the base of the skull through the vertex without intravenous contrast.  COMPARISON:  Head CT from 1 day prior  FINDINGS: Skull and Sinuses:Right parietal calvarial fracture shows no interval displacement/depression. A central skullbase fracture, traversing the right pterion and sphenoid sinuses is unchanged. No visible pneumocephalus.  Right zygomaticomaxillary complex fracture, right orbital hemorrhage, and right proptosis are stable from previous.  Brain: As expected, blooming of bilateral temporal lobe hemorrhagic contusions, with increased hemorrhage and surrounding edema. The contusion is more extensive on the left, with the largest hemorrhagic area measuring up to 2.5 cm diameter. There is no significant increase in mass effect. No herniation.  Scattered small volume subarachnoid hemorrhage is similar to previous, best visualized around the cerebral convexities. There is no hydrocephalus.  Thin subdural hematoma along the left tentorium and in the left occipital region is stable. The left occipital clot measures up to 8 mm in thickness.  No evidence of herniation, hydrocephalus, or infarct. There is brain atrophy and chronic small vessel disease, mild for age.  IMPRESSION: 1. Left greater than right temporal lobe contusions with progressive hemorrhage and edema. No midline shift or herniation. 2. Similar small volume subarachnoid and subdural hemorrhage. 3. Calvarial, skullbase, and facial fractures as previously described.   Electronically Signed   By: Monte Fantasia M.D.   On: 05/21/2014 06:11   Ct Head Wo Contrast  05/20/2014   CLINICAL DATA:  Fall down 3 steps in her garage, landed head 1st on concrete floor, found unresponsive, right head laceration with bruising to right eye  EXAM: CT HEAD WITHOUT CONTRAST  CT CERVICAL SPINE WITHOUT CONTRAST  TECHNIQUE: Multidetector CT imaging of the head and cervical spine was  performed following the standard protocol without intravenous contrast. Multiplanar CT image reconstructions of the cervical spine were also generated.  COMPARISON:  None.  FINDINGS: CT HEAD FINDINGS  Extracranial hematoma overlying the right frontal bone (series 3/image 22).  Underlying parenchymal contusion/subarachnoid hemorrhage in the right frontal lobe (series 3/image 20) with associated parenchymal contusion in the  left parietal lobe (series 3/ image 17).  Additional subarachnoid hemorrhage in the left frontal lobe (series 3/ image 19).  Additional layering hemorrhage/parenchymal contusion in the left upper lobe/ left middle cranial fossa (series 3/images 8-9).  Layering fluid/ hemorrhage in the right maxillary sinus (series 3/image 3). Suspected associated nondisplaced fracture involving the anterior wall of the right maxillary sinus (series 4/image 4).  Additional hemorrhage in the right orbital apex (series 3/ image 6) with associated proptosis of the right globe. Associated fracture involving the zygomatic arch. Fracture involving the right frontal bone (series 4/image 41) and extending through the greater wing of the sphenoid (series 4/ images 10-14).  Involvement of the bilateral sphenoid sinuses with associated hemorrhage.  No mass lesion, mass effect, or midline shift.  No CT evidence of acute infarction.  Basilar cisterns are patent.  Small vessel ischemic changes.  Intracranial atherosclerosis.  Cerebral volume is age appropriate.  No ventriculomegaly.  No evidence of intraventricular hemorrhage.  CT CERVICAL SPINE FINDINGS  Normal cervical lordosis.  No evidence of fracture or dislocation. Vertebral body heights are maintained. Dens appears intact.  No prevertebral soft tissue swelling.  4 mm anterolisthesis of C4 on C5.  3 mm anterolisthesis of C5 on C6.  Moderate multilevel degenerative changes.  IMPRESSION: Hematoma in the right orbital apex with associated proptosis.  Fractures involving the right zygomatic arch, right frontal bone, and extending through the greater wing of the sphenoid. Associated fractures involving the right maxillary sinus and bilateral sphenoid sinuses.  Extracranial hematoma overlying the right frontal bone.  Underlying parenchymal contusion in the right frontal lobe, left parietal lobe, and left temporal lobe/middle cranial fossa. Additional subarachnoid hemorrhage in the left frontal  lobe.  No evidence of acute traumatic injury to the cervical spine.  Critical Value/emergent results were discussed in person at the time of interpretation on 05/20/2014 at 4:35 pm to Dr. [name], who verbally acknowledged these results.   Electronically Signed   By: Julian Hy M.D.   On: 05/20/2014 17:05   Ct Cervical Spine Wo Contrast  05/20/2014   CLINICAL DATA:  Fall down 3 steps in her garage, landed head 1st on concrete floor, found unresponsive, right head laceration with bruising to right eye  EXAM: CT HEAD WITHOUT CONTRAST  CT CERVICAL SPINE WITHOUT CONTRAST  TECHNIQUE: Multidetector CT imaging of the head and cervical spine was performed following the standard protocol without intravenous contrast. Multiplanar CT image reconstructions of the cervical spine were also generated.  COMPARISON:  None.  FINDINGS: CT HEAD FINDINGS  Extracranial hematoma overlying the right frontal bone (series 3/image 22).  Underlying parenchymal contusion/subarachnoid hemorrhage in the right frontal lobe (series 3/image 20) with associated parenchymal contusion in the left parietal lobe (series 3/ image 17).  Additional subarachnoid hemorrhage in the left frontal lobe (series 3/ image 19).  Additional layering hemorrhage/parenchymal contusion in the left upper lobe/ left middle cranial fossa (series 3/images 8-9).  Layering fluid/ hemorrhage in the right maxillary sinus (series 3/image 3). Suspected associated nondisplaced fracture involving the anterior wall of the right maxillary sinus (series 4/image 4).  Additional hemorrhage in the right orbital apex (series 3/  image 6) with associated proptosis of the right globe. Associated fracture involving the zygomatic arch. Fracture involving the right frontal bone (series 4/image 41) and extending through the greater wing of the sphenoid (series 4/ images 10-14).  Involvement of the bilateral sphenoid sinuses with associated hemorrhage.  No mass lesion, mass effect,  or midline shift.  No CT evidence of acute infarction.  Basilar cisterns are patent.  Small vessel ischemic changes.  Intracranial atherosclerosis.  Cerebral volume is age appropriate.  No ventriculomegaly.  No evidence of intraventricular hemorrhage.  CT CERVICAL SPINE FINDINGS  Normal cervical lordosis.  No evidence of fracture or dislocation. Vertebral body heights are maintained. Dens appears intact.  No prevertebral soft tissue swelling.  4 mm anterolisthesis of C4 on C5.  3 mm anterolisthesis of C5 on C6.  Moderate multilevel degenerative changes.  IMPRESSION: Hematoma in the right orbital apex with associated proptosis.  Fractures involving the right zygomatic arch, right frontal bone, and extending through the greater wing of the sphenoid. Associated fractures involving the right maxillary sinus and bilateral sphenoid sinuses.  Extracranial hematoma overlying the right frontal bone.  Underlying parenchymal contusion in the right frontal lobe, left parietal lobe, and left temporal lobe/middle cranial fossa. Additional subarachnoid hemorrhage in the left frontal lobe.  No evidence of acute traumatic injury to the cervical spine.  Critical Value/emergent results were discussed in person at the time of interpretation on 05/20/2014 at 4:35 pm to Dr. [name], who verbally acknowledged these results.   Electronically Signed   By: Julian Hy M.D.   On: 05/20/2014 17:05   Dg Pelvis Portable  05/20/2014   CLINICAL DATA:  Level 1 trauma  EXAM: PORTABLE PELVIS 1-2 VIEWS  COMPARISON:  None.  FINDINGS: Degenerative changes in the hips bilaterally with joint space narrowing and spurring. Degenerative spurring scratch head degenerative changes in the visualized lower lumbar spine. No fracture, subluxation or dislocation.  IMPRESSION: No acute bony abnormality.   Electronically Signed   By: Rolm Baptise M.D.   On: 05/20/2014 16:44   Dg Chest Portable 1 View  05/20/2014   CLINICAL DATA:  Level 1 trauma.   EXAM: PORTABLE CHEST - 1 VIEW  COMPARISON:  None.  FINDINGS: Elevation of the right hemidiaphragm. Low lung volumes. Heart is borderline in size. No confluent airspace opacities. No effusions. No acute bony abnormality.  IMPRESSION: Elevated right hemidiaphragm.  No acute findings.   Electronically Signed   By: Rolm Baptise M.D.   On: 05/20/2014 16:44   Ct Maxillofacial Wo Cm  05/20/2014   CLINICAL DATA:  Found down and unresponsive. Maxillofacial fractures noted on the CT of the head and cervical spine. Further evaluation requested. Initial encounter.  EXAM: CT MAXILLOFACIAL WITHOUT CONTRAST  TECHNIQUE: Multidetector CT imaging of the maxillofacial structures was performed. Multiplanar CT image reconstructions were also generated. A small metallic BB was placed on the right temple in order to reliably differentiate right from left.  COMPARISON:  None.  FINDINGS: As noted on recent CT of the head and cervical spine, there is a minimally displaced tetrapod fracture involving the right zygomaticomaxillary complex. The superior buttress fracture extends across the suture, while there is a mildly displaced fracture through the right orbital floor and superior aspect of the lateral wall of the right maxillary sinus. The fracture extends across the anterior wall of the right maxillary sinus. There is a minimally displaced fracture extending across the right zygomatic arch, and as described on prior CT, there is extension of  a fracture line across the base of the skull, primarily on the right, though also extending to the left, passing through the sphenoid sinus on both sides. This does not appear to involve the more posterior vascular foramina.  There is right-sided proptosis. Underlying intraorbital hematoma and air is seen tracking along the posterior aspect of the orbit, measuring perhaps 1.9 x 0.5 x 0.8 cm, with poor characterization of the adjacent inferior rectus muscle. The right optic globe appears grossly  intact. The left orbit is unremarkable in appearance.  Blood is noted filling the right maxillary sinus and sphenoid sinus, and partially filling the nasal passages. The remaining paranasal sinuses and mastoid air cells are well aerated.  The mandible appears intact. The nasal bone is unremarkable in appearance. The visualized dentition demonstrates no acute abnormality.  Intraparenchymal hemorrhage at the left temporal lobe is better defined than on the prior study, and appears to have increased mildly in size, measuring 1.9 x 1.7 cm. Mild left-sided subdural blood is suggested along the tentorium cerebelli. Parenchymal contusion is again noted along the left parietal lobe.  Mild soft tissue swelling is noted about the right orbit, and overlying the right maxilla, with minimal soft tissue air overlying the right maxilla. The parapharyngeal fat planes are preserved. The nasopharynx, oropharynx and hypopharynx are unremarkable in appearance. The visualized portions of the valleculae and piriform sinuses are grossly unremarkable.  The parotid and visualized portions of the submandibular glands are within normal limits. No cervical lymphadenopathy is seen.  IMPRESSION: 1. Minimally displaced tetrapod fracture of the right zygomaticomaxillary complex, described in further detail above, with blood filling the right maxillary sinus and sphenoid sinus, and partially filling the nasal passages. 2. Fracture line extending across the base of the skull, more prominent on the right, though also extending to the left, passing through the sphenoid sinus on both sides. 3. Right-sided proptosis noted. This reflects underlying intraorbital hematoma and air tracking along the posterior aspect of the orbit, measuring approximately 1.9 x 0.5 x 0.8 cm, with poor characterization of the adjacent posterior aspect of the inferior rectus muscle, though the anterior aspect of the muscle appears intact. 4. Intraparenchymal hemorrhage at the  left temporal lobe is better defined, and appears to have increased mildly in size, measuring 1.9 x 1.7 cm. 5. Mild left-sided subdural blood noted along the tentorium cerebelli. 6. Parenchymal contusion again noted along the left parietal lobe. 7. Mild soft tissue swelling about the right orbit, and overlying the right maxilla, with minimal associated soft tissue air overlying the right maxilla.  These results were called by telephone at the time of interpretation on 05/20/2014 at 5:49 pm to Dr. Roxanne Gates, who verbally acknowledged these results.   Electronically Signed   By: Garald Balding M.D.   On: 05/20/2014 17:50    Anti-infectives: Anti-infectives    None      Assessment/Plan: s/p  TBI team evaluation.  Disposition right now looking likel SNF or Rehab  LOS: 1 day   Kathryne Eriksson. Dahlia Bailiff, MD, FACS [phone] Trauma Surgeon 05/21/2014

## 2014-05-21 NOTE — Consult Note (Signed)
Reason for Consult: Facial fractures Referring Physician: Dr. [name]  Tracy Reeves is an 81 y.o. female.  HPI: Tracy Reeves is an 81 yo female who was found unresponsive at the bottom of her steps in her carport. She was brought in as a Level one trauma and found to have TBI, multiple facial fractures and basilar skull fractures. We are asked to assess her facial fractures.   Her daughter is present during the exam and reports that the patient was living independently and still driving.  She has a right peri-orbital contusion with edema and ecchymosis. She is opening her eyes, but unable to speak at this time. She is unable to follow commands.  Her CT scan showed:  Minimally displaced tetrapod fracture of the right zygomaticomaxillary complex, with blood filling the right maxillary sinus and sphenoid sinus, and blood partially filling the nasal passages. Basilar skull fracture line extending across the base of the skull, more prominent on the right, though also extending to the left, passing through the sphenoid sinus on both sides. Past Medical History  Diagnosis Date  . Hypertension   . Shingles     History reviewed. No pertinent past surgical history.  History reviewed. No pertinent family history.  Social History:  reports that she has never smoked. She does not have any smokeless tobacco history on file. Her alcohol and drug histories are not on file.  Allergies: No Known Allergies  Medications: I have reviewed the patient's current medications.  Results for orders placed or performed during the hospital encounter of 05/20/14 (from the past 48 hour(s))  Prepare fresh frozen plasma     Status: None   Collection Time: 05/20/14  3:35 PM  Result Value Ref Range   Unit Number [id number]    Blood Component Type THW PLS APHR    Unit division 00    Status of Unit REL FROM [facility]    Transfusion Status OK TO TRANSFUSE    Unit Number [id number]    Blood Component  Type THAWED PLASMA    Unit division 00    Status of Unit REL FROM [facility]    Transfusion Status OK TO TRANSFUSE   CDS serology     Status: None   Collection Time: 05/20/14  4:01 PM  Result Value Ref Range   CDS serology specimen      SPECIMEN WILL BE HELD FOR 14 DAYS IF TESTING IS REQUIRED  Comprehensive metabolic panel     Status: Abnormal   Collection Time: 05/20/14  4:01 PM  Result Value Ref Range   Sodium 137 135 - 145 mmol/L   Potassium 4.0 3.5 - 5.1 mmol/L   Chloride 102 96 - 112 mmol/L   CO2 30 19 - 32 mmol/L   Glucose, Bld 120 (H) 70 - 99 mg/dL   BUN 18 6 - 23 mg/dL   Creatinine, Ser 1.25 (H) 0.50 - 1.10 mg/dL   Calcium 9.1 8.4 - 10.5 mg/dL   Total Protein 6.3 6.0 - 8.3 g/dL   Albumin 3.6 3.5 - 5.2 g/dL   AST 46 (H) 0 - 37 U/L   ALT 28 0 - 35 U/L   Alkaline Phosphatase 46 39 - 117 U/L   Total Bilirubin 0.3 0.3 - 1.2 mg/dL   GFR calc non Af Amer 37 (L) >90 mL/min   GFR calc Af Amer 43 (L) >90 mL/min    Comment: (NOTE) The eGFR has been calculated using the CKD EPI equation. This calculation has not been validated  in all clinical situations. eGFR's persistently <90 mL/min signify possible Chronic Kidney Disease.    Anion gap 5 5 - 15  CBC     Status: None   Collection Time: 05/20/14  4:01 PM  Result Value Ref Range   WBC 8.5 4.0 - 10.5 K/uL   RBC 4.37 3.87 - 5.11 MIL/uL   Hemoglobin 13.0 12.0 - 15.0 g/dL   HCT 40.0 36.0 - 46.0 %   MCV 91.5 78.0 - 100.0 fL   MCH 29.7 26.0 - 34.0 pg   MCHC 32.5 30.0 - 36.0 g/dL   RDW 14.0 11.5 - 15.5 %   Platelets 264 150 - 400 K/uL  Ethanol     Status: None   Collection Time: 05/20/14  4:01 PM  Result Value Ref Range   Alcohol, Ethyl (B) <5 0 - 9 mg/dL    Comment:        LOWEST DETECTABLE LIMIT FOR SERUM ALCOHOL IS 11 mg/dL FOR MEDICAL PURPOSES ONLY   Protime-INR     Status: None   Collection Time: 05/20/14  4:01 PM  Result Value Ref Range   Prothrombin Time 13.9 11.6 - 15.2 seconds   INR 1.06 0.00 - 1.49  Type and  screen     Status: None   Collection Time: 05/20/14  4:04 PM  Result Value Ref Range   ABO/RH(D) O POS    Antibody Screen NEG    Sample Expiration 05/23/2014    Unit Number [id number]    Blood Component Type RED CELLS,LR    Unit division 00    Status of Unit REL FROM [facility]    Unit tag comment VERBAL ORDERS PER DR WOFFORD    Transfusion Status OK TO TRANSFUSE    Crossmatch Result NOT NEEDED    Unit Number [id number]    Blood Component Type RED CELLS,LR    Unit division 00    Status of Unit REL FROM [facility]    Unit tag comment VERBAL ORDERS PER DR WOFFORD    Transfusion Status OK TO TRANSFUSE    Crossmatch Result NOT NEEDED   ABO/Rh     Status: None   Collection Time: 05/20/14  4:04 PM  Result Value Ref Range   ABO/RH(D) O POS   I-stat chem 8, ed     Status: Abnormal   Collection Time: 05/20/14  4:11 PM  Result Value Ref Range   Sodium 139 135 - 145 mmol/L   Potassium 4.0 3.5 - 5.1 mmol/L   Chloride 100 96 - 112 mmol/L   BUN 21 6 - 23 mg/dL   Creatinine, Ser 1.20 (H) 0.50 - 1.10 mg/dL   Glucose, Bld 119 (H) 70 - 99 mg/dL   Calcium, Ion 1.17 1.13 - 1.30 mmol/L   TCO2 24 0 - 100 mmol/L   Hemoglobin 14.6 12.0 - 15.0 g/dL   HCT 43.0 36.0 - 46.0 %  I-Stat CG4 Lactic Acid, ED     Status: None   Collection Time: 05/20/14  4:12 PM  Result Value Ref Range   Lactic Acid, Venous 1.52 0.5 - 2.0 mmol/L  MRSA PCR Screening     Status: None   Collection Time: 05/20/14  7:02 PM  Result Value Ref Range   MRSA by PCR NEGATIVE NEGATIVE    Comment:        The GeneXpert MRSA Assay (FDA approved for NASAL specimens only), is one component of a comprehensive MRSA colonization surveillance program. It is not intended to diagnose MRSA infection  nor to guide or monitor treatment for MRSA infections.   CBC     Status: Abnormal   Collection Time: 05/21/14  2:44 AM  Result Value Ref Range   WBC 14.2 (H) 4.0 - 10.5 K/uL   RBC 4.34 3.87 - 5.11 MIL/uL   Hemoglobin 13.1 12.0 - 15.0 g/dL    HCT 39.9 36.0 - 46.0 %   MCV 91.9 78.0 - 100.0 fL   MCH 30.2 26.0 - 34.0 pg   MCHC 32.8 30.0 - 36.0 g/dL   RDW 13.9 11.5 - 15.5 %   Platelets 212 150 - 400 K/uL  Protime-INR     Status: None   Collection Time: 05/21/14  2:44 AM  Result Value Ref Range   Prothrombin Time 14.4 11.6 - 15.2 seconds   INR 1.10 0.00 - 5.27  Basic metabolic panel     Status: Abnormal   Collection Time: 05/21/14  2:44 AM  Result Value Ref Range   Sodium 134 (L) 135 - 145 mmol/L   Potassium 4.1 3.5 - 5.1 mmol/L   Chloride 101 96 - 112 mmol/L   CO2 21 19 - 32 mmol/L   Glucose, Bld 205 (H) 70 - 99 mg/dL   BUN 13 6 - 23 mg/dL   Creatinine, Ser 0.98 0.50 - 1.10 mg/dL   Calcium 8.5 8.4 - 10.5 mg/dL   GFR calc non Af Amer 50 (L) >90 mL/min   GFR calc Af Amer 58 (L) >90 mL/min    Comment: (NOTE) The eGFR has been calculated using the CKD EPI equation. This calculation has not been validated in all clinical situations. eGFR's persistently <90 mL/min signify possible Chronic Kidney Disease.    Anion gap 12 5 - 15    Ct Head Without Contrast  05/21/2014   CLINICAL DATA:  Followup traumatic brain injury.  EXAM: CT HEAD WITHOUT CONTRAST  TECHNIQUE: Contiguous axial images were obtained from the base of the skull through the vertex without intravenous contrast.  COMPARISON:  Head CT from 1 day prior  FINDINGS: Skull and Sinuses:Right parietal calvarial fracture shows no interval displacement/depression. A central skullbase fracture, traversing the right pterion and sphenoid sinuses is unchanged. No visible pneumocephalus. Right zygomaticomaxillary complex fracture, right orbital hemorrhage, and right proptosis are stable from previous.  Brain: As expected, blooming of bilateral temporal lobe hemorrhagic contusions, with increased hemorrhage and surrounding edema. The contusion is more extensive on the left, with the largest hemorrhagic area measuring up to 2.5 cm diameter. There is no significant increase in mass  effect. No herniation.  Scattered small volume subarachnoid hemorrhage is similar to previous, best visualized around the cerebral convexities. There is no hydrocephalus.  Thin subdural hematoma along the left tentorium and in the left occipital region is stable. The left occipital clot measures up to 8 mm in thickness.  No evidence of herniation, hydrocephalus, or infarct. There is brain atrophy and chronic small vessel disease, mild for age.  IMPRESSION: 1. Left greater than right temporal lobe contusions with progressive hemorrhage and edema. No midline shift or herniation. 2. Similar small volume subarachnoid and subdural hemorrhage. 3. Calvarial, skullbase, and facial fractures as previously described.   Electronically Signed   By: Monte Fantasia M.D.   On: 05/21/2014 06:11   Ct Head Wo Contrast  05/20/2014   CLINICAL DATA:  Fall down 3 steps in her garage, landed head 1st on concrete floor, found unresponsive, right head laceration with bruising to right eye  EXAM: CT HEAD WITHOUT CONTRAST  CT CERVICAL SPINE WITHOUT CONTRAST  TECHNIQUE: Multidetector CT imaging of the head and cervical spine was performed following the standard protocol without intravenous contrast. Multiplanar CT image reconstructions of the cervical spine were also generated.  COMPARISON:  None.  FINDINGS: CT HEAD FINDINGS  Extracranial hematoma overlying the right frontal bone (series 3/image 22).  Underlying parenchymal contusion/subarachnoid hemorrhage in the right frontal lobe (series 3/image 20) with associated parenchymal contusion in the left parietal lobe (series 3/ image 17).  Additional subarachnoid hemorrhage in the left frontal lobe (series 3/ image 19).  Additional layering hemorrhage/parenchymal contusion in the left upper lobe/ left middle cranial fossa (series 3/images 8-9).  Layering fluid/ hemorrhage in the right maxillary sinus (series 3/image 3). Suspected associated nondisplaced fracture involving the anterior wall  of the right maxillary sinus (series 4/image 4).  Additional hemorrhage in the right orbital apex (series 3/ image 6) with associated proptosis of the right globe. Associated fracture involving the zygomatic arch. Fracture involving the right frontal bone (series 4/image 41) and extending through the greater wing of the sphenoid (series 4/ images 10-14).  Involvement of the bilateral sphenoid sinuses with associated hemorrhage.  No mass lesion, mass effect, or midline shift.  No CT evidence of acute infarction.  Basilar cisterns are patent.  Small vessel ischemic changes.  Intracranial atherosclerosis.  Cerebral volume is age appropriate.  No ventriculomegaly.  No evidence of intraventricular hemorrhage.  CT CERVICAL SPINE FINDINGS  Normal cervical lordosis.  No evidence of fracture or dislocation. Vertebral body heights are maintained. Dens appears intact.  No prevertebral soft tissue swelling.  4 mm anterolisthesis of C4 on C5.  3 mm anterolisthesis of C5 on C6.  Moderate multilevel degenerative changes.  IMPRESSION: Hematoma in the right orbital apex with associated proptosis.  Fractures involving the right zygomatic arch, right frontal bone, and extending through the greater wing of the sphenoid. Associated fractures involving the right maxillary sinus and bilateral sphenoid sinuses.  Extracranial hematoma overlying the right frontal bone.  Underlying parenchymal contusion in the right frontal lobe, left parietal lobe, and left temporal lobe/middle cranial fossa. Additional subarachnoid hemorrhage in the left frontal lobe.  No evidence of acute traumatic injury to the cervical spine.  Critical Value/emergent results were discussed in person at the time of interpretation on 05/20/2014 at 4:35 pm to Dr. [name], who verbally acknowledged these results.   Electronically Signed   By: Julian Hy M.D.   On: 05/20/2014 17:05   Ct Cervical Spine Wo Contrast  05/20/2014   CLINICAL DATA:  Fall down 3 steps  in her garage, landed head 1st on concrete floor, found unresponsive, right head laceration with bruising to right eye  EXAM: CT HEAD WITHOUT CONTRAST  CT CERVICAL SPINE WITHOUT CONTRAST  TECHNIQUE: Multidetector CT imaging of the head and cervical spine was performed following the standard protocol without intravenous contrast. Multiplanar CT image reconstructions of the cervical spine were also generated.  COMPARISON:  None.  FINDINGS: CT HEAD FINDINGS  Extracranial hematoma overlying the right frontal bone (series 3/image 22).  Underlying parenchymal contusion/subarachnoid hemorrhage in the right frontal lobe (series 3/image 20) with associated parenchymal contusion in the left parietal lobe (series 3/ image 17).  Additional subarachnoid hemorrhage in the left frontal lobe (series 3/ image 19).  Additional layering hemorrhage/parenchymal contusion in the left upper lobe/ left middle cranial fossa (series 3/images 8-9).  Layering fluid/ hemorrhage in the right maxillary sinus (series 3/image 3). Suspected associated nondisplaced fracture involving the anterior wall of  the right maxillary sinus (series 4/image 4).  Additional hemorrhage in the right orbital apex (series 3/ image 6) with associated proptosis of the right globe. Associated fracture involving the zygomatic arch. Fracture involving the right frontal bone (series 4/image 41) and extending through the greater wing of the sphenoid (series 4/ images 10-14).  Involvement of the bilateral sphenoid sinuses with associated hemorrhage.  No mass lesion, mass effect, or midline shift.  No CT evidence of acute infarction.  Basilar cisterns are patent.  Small vessel ischemic changes.  Intracranial atherosclerosis.  Cerebral volume is age appropriate.  No ventriculomegaly.  No evidence of intraventricular hemorrhage.  CT CERVICAL SPINE FINDINGS  Normal cervical lordosis.  No evidence of fracture or dislocation. Vertebral body heights are maintained. Dens appears  intact.  No prevertebral soft tissue swelling.  4 mm anterolisthesis of C4 on C5.  3 mm anterolisthesis of C5 on C6.  Moderate multilevel degenerative changes.  IMPRESSION: Hematoma in the right orbital apex with associated proptosis.  Fractures involving the right zygomatic arch, right frontal bone, and extending through the greater wing of the sphenoid. Associated fractures involving the right maxillary sinus and bilateral sphenoid sinuses.  Extracranial hematoma overlying the right frontal bone.  Underlying parenchymal contusion in the right frontal lobe, left parietal lobe, and left temporal lobe/middle cranial fossa. Additional subarachnoid hemorrhage in the left frontal lobe.  No evidence of acute traumatic injury to the cervical spine.  Critical Value/emergent results were discussed in person at the time of interpretation on 05/20/2014 at 4:35 pm to Dr. [name], who verbally acknowledged these results.   Electronically Signed   By: Julian Hy M.D.   On: 05/20/2014 17:05   Dg Pelvis Portable  05/20/2014   CLINICAL DATA:  Level 1 trauma  EXAM: PORTABLE PELVIS 1-2 VIEWS  COMPARISON:  None.  FINDINGS: Degenerative changes in the hips bilaterally with joint space narrowing and spurring. Degenerative spurring scratch head degenerative changes in the visualized lower lumbar spine. No fracture, subluxation or dislocation.  IMPRESSION: No acute bony abnormality.   Electronically Signed   By: Rolm Baptise M.D.   On: 05/20/2014 16:44   Dg Chest Portable 1 View  05/20/2014   CLINICAL DATA:  Level 1 trauma.  EXAM: PORTABLE CHEST - 1 VIEW  COMPARISON:  None.  FINDINGS: Elevation of the right hemidiaphragm. Low lung volumes. Heart is borderline in size. No confluent airspace opacities. No effusions. No acute bony abnormality.  IMPRESSION: Elevated right hemidiaphragm.  No acute findings.   Electronically Signed   By: Rolm Baptise M.D.   On: 05/20/2014 16:44   Ct Maxillofacial Wo Cm  05/20/2014    CLINICAL DATA:  Found down and unresponsive. Maxillofacial fractures noted on the CT of the head and cervical spine. Further evaluation requested. Initial encounter.  EXAM: CT MAXILLOFACIAL WITHOUT CONTRAST  TECHNIQUE: Multidetector CT imaging of the maxillofacial structures was performed. Multiplanar CT image reconstructions were also generated. A small metallic BB was placed on the right temple in order to reliably differentiate right from left.  COMPARISON:  None.  FINDINGS: As noted on recent CT of the head and cervical spine, there is a minimally displaced tetrapod fracture involving the right zygomaticomaxillary complex. The superior buttress fracture extends across the suture, while there is a mildly displaced fracture through the right orbital floor and superior aspect of the lateral wall of the right maxillary sinus. The fracture extends across the anterior wall of the right maxillary sinus. There is a minimally displaced  fracture extending across the right zygomatic arch, and as described on prior CT, there is extension of a fracture line across the base of the skull, primarily on the right, though also extending to the left, passing through the sphenoid sinus on both sides. This does not appear to involve the more posterior vascular foramina.  There is right-sided proptosis. Underlying intraorbital hematoma and air is seen tracking along the posterior aspect of the orbit, measuring perhaps 1.9 x 0.5 x 0.8 cm, with poor characterization of the adjacent inferior rectus muscle. The right optic globe appears grossly intact. The left orbit is unremarkable in appearance.  Blood is noted filling the right maxillary sinus and sphenoid sinus, and partially filling the nasal passages. The remaining paranasal sinuses and mastoid air cells are well aerated.  The mandible appears intact. The nasal bone is unremarkable in appearance. The visualized dentition demonstrates no acute abnormality.  Intraparenchymal  hemorrhage at the left temporal lobe is better defined than on the prior study, and appears to have increased mildly in size, measuring 1.9 x 1.7 cm. Mild left-sided subdural blood is suggested along the tentorium cerebelli. Parenchymal contusion is again noted along the left parietal lobe.  Mild soft tissue swelling is noted about the right orbit, and overlying the right maxilla, with minimal soft tissue air overlying the right maxilla. The parapharyngeal fat planes are preserved. The nasopharynx, oropharynx and hypopharynx are unremarkable in appearance. The visualized portions of the valleculae and piriform sinuses are grossly unremarkable.  The parotid and visualized portions of the submandibular glands are within normal limits. No cervical lymphadenopathy is seen.  IMPRESSION: 1. Minimally displaced tetrapod fracture of the right zygomaticomaxillary complex, described in further detail above, with blood filling the right maxillary sinus and sphenoid sinus, and partially filling the nasal passages. 2. Fracture line extending across the base of the skull, more prominent on the right, though also extending to the left, passing through the sphenoid sinus on both sides. 3. Right-sided proptosis noted. This reflects underlying intraorbital hematoma and air tracking along the posterior aspect of the orbit, measuring approximately 1.9 x 0.5 x 0.8 cm, with poor characterization of the adjacent posterior aspect of the inferior rectus muscle, though the anterior aspect of the muscle appears intact. 4. Intraparenchymal hemorrhage at the left temporal lobe is better defined, and appears to have increased mildly in size, measuring 1.9 x 1.7 cm. 5. Mild left-sided subdural blood noted along the tentorium cerebelli. 6. Parenchymal contusion again noted along the left parietal lobe. 7. Mild soft tissue swelling about the right orbit, and overlying the right maxilla, with minimal associated soft tissue air overlying the right  maxilla.  These results were called by telephone at the time of interpretation on 05/20/2014 at 5:49 pm to Dr. Roxanne Gates, who verbally acknowledged these results.   Electronically Signed   By: Garald Balding M.D.   On: 05/20/2014 17:50    Review of Systems  Unable to perform ROS: patient nonverbal   Blood pressure 139/58, pulse 89, temperature 99.7 F (37.6 C), temperature source Oral, resp. rate 17, height 5' (1.524 m), weight 54 kg (119 lb 0.8 oz), SpO2 99 %. Physical Exam  Constitutional:  Elderly thin female lying in bed occasionally moans and opens eyes briefly. Grimacing with attempts to open right eye.   HENT:  Scalp and right facial/peri-orbital contusions  Eyes:  Difficult to assess EOM's as patient unable to follow commands. She does seem conjugate when eyes are briefly open. Right subconjunctival hemorrhage  noted  Cardiovascular: Normal rate and regular rhythm.   Respiratory: Effort normal. No respiratory distress.  GI: Soft.  Musculoskeletal:  Contusions over upper extremities. She does appear to be moving upper extremities, but unable to follow commands so exam difficult  Neurological:  Somnolent. Arouses briefly to tactile stimulation   Skin: Skin is warm and dry.    Assessment/Plan: Fall TBI/right frontal ICC/L parietal ICC/L temporal ICC  Multiple facial fractures and skull base FXs - Fractures are minimally displaced. Dr. Migdalia Dk to review CT scans further. We will follow along with you as patient progresses.  Skull base FX with associated hematoma at R orbital apex - opthalmology consulted R forehead lac and scalp hematoma  Larwence Tu,PA-C Plastic Surgery [phone]

## 2014-05-21 NOTE — Progress Notes (Signed)
UR completed.  Charese Abundis, RN BSN MHA CCM Trauma/Neuro ICU Case Manager [phone]  

## 2014-05-21 NOTE — Progress Notes (Signed)
Pt seen and examined. No issues overnight.  EXAM: Temp:  [98.7 F (37.1 C)-100.4 F (38 C)] 99.7 F (37.6 C) (02/29 0800) Pulse Rate:  [75-92] 89 (02/29 0800) Resp:  [14-30] 17 (02/29 0800) BP: (122-156)/(50-80) 139/58 mmHg (02/29 0800) SpO2:  [91 %-100 %] 99 % (02/29 0800) Weight:  [54 kg (119 lb 0.8 oz)-65.772 kg (145 lb)] 54 kg (119 lb 0.8 oz) (02/28 2000) Intake/Output      02/28 0701 - 02/29 0700 02/29 0701 - 03/01 0700   I.V. (mL/kg) 732.5 (13.6) 75 (1.4)   Total Intake(mL/kg) 732.5 (13.6) 75 (1.4)   Urine (mL/kg/hr) 1700    Total Output 1700     Net -967.5 +75         Awake, alert, disoriented Speech is not clearly intelligible Moves all extremities well  LABS: Lab Results  Component Value Date   CREATININE 0.98 05/21/2014   BUN 13 05/21/2014   NA 134* 05/21/2014   K 4.1 05/21/2014   CL 101 05/21/2014   CO2 21 05/21/2014   Lab Results  Component Value Date   WBC 14.2* 05/21/2014   HGB 13.1 05/21/2014   HCT 39.9 05/21/2014   MCV 91.9 05/21/2014   PLT 212 05/21/2014    IMAGING: Repeat CTH demonstrates blossoming of right temporal contusions without significant mass effect. No HCP  IMPRESSION: - 81 y.o. female s/p fall with left temporal contusions, neurologically stable  PLAN: - Cont observation

## 2014-05-22 DIAGNOSIS — S069X4D Unspecified intracranial injury with loss of consciousness of 6 hours to 24 hours, subsequent encounter: Secondary | ICD-10-CM

## 2014-05-22 LAB — BLOOD PRODUCT ORDER (VERBAL) VERIFICATION

## 2014-05-22 MED ORDER — CETYLPYRIDINIUM CHLORIDE 0.05 % MT LIQD
7.0000 mL | Freq: Two times a day (BID) | OROMUCOSAL | Status: DC
  Administered 2014-05-22 – 2014-05-23 (×4): 7 mL via OROMUCOSAL

## 2014-05-22 MED ORDER — CHLORHEXIDINE GLUCONATE 0.12 % MT SOLN
15.0000 mL | Freq: Two times a day (BID) | OROMUCOSAL | Status: DC
  Administered 2014-05-22 – 2014-05-23 (×4): 15 mL via OROMUCOSAL
  Filled 2014-05-22 (×4): qty 15

## 2014-05-22 NOTE — Progress Notes (Signed)
Pt seen and examined. No issues overnight. Pt remains unable to provide interval history.  EXAM: Temp:  [98 F (36.7 C)-98.9 F (37.2 C)] 98.7 F (37.1 C) (03/01 1600) Pulse Rate:  [70-93] 79 (03/01 1500) Resp:  [13-27] 18 (03/01 1500) BP: (117-160)/(52-94) 160/68 mmHg (03/01 1500) SpO2:  [94 %-100 %] 100 % (03/01 1500) Intake/Output      02/29 0701 - 03/01 0700 03/01 0701 - 03/02 0700   I.V. (mL/kg) 1725 (31.9) 600 (11.1)   Other 625    Total Intake(mL/kg) 2350 (43.5) 600 (11.1)   Urine (mL/kg/hr) 1025 (0.8) 625 (1.2)   Total Output 1025 625   Net +1325 -25         Awake, alert Able to say words but not intelligible CN grossly intact Good strength  LABS: Lab Results  Component Value Date   CREATININE 0.98 05/21/2014   BUN 13 05/21/2014   NA 134* 05/21/2014   K 4.1 05/21/2014   CL 101 05/21/2014   CO2 21 05/21/2014   Lab Results  Component Value Date   WBC 14.2* 05/21/2014   HGB 13.1 05/21/2014   HCT 39.9 05/21/2014   MCV 91.9 05/21/2014   PLT 212 05/21/2014    IMPRESSION: - 81 y.o. female s/p fall with left temporal contusion and aphasia, remains essentially stable  PLAN: - Cont current mgmt. Will likely need placement once medically stable.

## 2014-05-22 NOTE — Evaluation (Signed)
Speech Language Pathology Evaluation Patient Details Name: Tracy Reeves MRN: [id number] DOB: March 31, 1922 Today's Date: 05/22/2014 Time: 4801-6553 SLP Time Calculation (min) (ACUTE ONLY): 9 min  Problem List:  Patient Active Problem List   Diagnosis Date Noted  . TBI (traumatic brain injury) 05/20/2014   Past Medical History:  Past Medical History  Diagnosis Date  . Hypertension   . Shingles    Past Surgical History: History reviewed. No pertinent past surgical history. HPI:  81 year old female with unknown past medical history HTN, shingles presents after unwitnessed fall and loss of consciousness from height of 3 feet. CT Underlying parenchymal contusion in the right frontal lobe, left parietal lobe, and left temporal lobe/middle cranial fossa. Additional subarachnoid hemorrhage in the left frontal lobePt remains GCS 13-14. Hematoma in the right orbital apex with associated proptosis. Fractures involving the right zygomatic arch, right frontal bone, and extending through the greater wing of the sphenoid. Associated fractures involving the right maxillary sinus and bilateral sphenoid sinuses. Extracranial hematoma overlying the right frontal bone.    Assessment / Plan / Recommendation Clinical Impression  Pt exhibits significant cognitive impairments with decreased sustained attention, intellectual and emergent awareness, poor orientation to self/time/situation. Pt restless pulling at bedsheets, repeating same questions after 45 second delay. Daughter stated pt lived alone prior to admission, driving and independent. Daughter noted that her mom had started to perseverate on topic such as "taxes." SLP suspects pt may have had min-mild underlying cognitive deficits (?). Pt would benefit from continued ST for cognitive intervention.     SLP Assessment  Patient needs continued Speech Lanaguage Pathology Services    Follow Up Recommendations  Inpatient Rehab    Frequency and Duration min  2x/week  2 weeks   Pertinent Vitals/Pain Pain Assessment: Faces Faces Pain Scale: Hurts even more Pain Location:  (unknown) Pain Descriptors / Indicators: Grimacing Pain Intervention(s): Limited activity within patient's tolerance;Monitored during session;Repositioned;Relaxation   SLP Goals  Potential to Achieve Goals (ACUTE ONLY):  (fair-good) Potential Considerations (ACUTE ONLY): Co-morbidities  SLP Evaluation Prior Functioning  Cognitive/Linguistic Baseline:  (dtr reports functional, SLP suspects min intermittent defici)  Lives With: Alone Vocation: Retired   Associate Professor  Overall Cognitive Status: Impaired/Different from baseline Arousal/Alertness: Awake/alert Orientation Level: Disoriented to place;Disoriented to time;Disoriented to situation;Disoriented to person Attention: Sustained Sustained Attention: Impaired Sustained Attention Impairment: Verbal basic;Functional basic Memory: Impaired Memory Impairment: Storage deficit;Retrieval deficit;Decreased recall of new information;Decreased short term memory;Prospective memory Awareness: Impaired Awareness Impairment: Intellectual impairment;Emergent impairment;Anticipatory impairment Problem Solving: Impaired Problem Solving Impairment: Verbal basic;Functional basic Behaviors: Restless;Impulsive Safety/Judgment: Impaired [place] [employer] Scales of Cognitive Functioning: Confused/inappropriate/non-agitated    Comprehension  Auditory Comprehension Overall Auditory Comprehension: Impaired Yes/No Questions: Impaired Basic Biographical Questions: 0-25% accurate Basic Immediate Environment Questions: 0-24% accurate Commands: Impaired One Step Basic Commands: 0-24% accurate Conversation: Simple Interfering Components: Attention Visual Recognition/Discrimination Discrimination: Not tested Reading Comprehension Reading Status:  (TBA)    Expression Expression Primary Mode of Expression: Verbal Verbal Expression Overall  Verbal Expression: Appears within functional limits for tasks assessed Initiation: No impairment Level of Generative/Spontaneous Verbalization: Sentence Repetition:  (NT) Naming: Not tested Pragmatics: Impairment Impairments: Interpretation of nonverbal communication;Eye contact;Topic maintenance;Topic appropriateness Interfering Components: Attention Written Expression Dominant Hand:  (unknown) Written Expression:  (TBA)   Oral / Motor Oral Motor/Sensory Function Overall Oral Motor/Sensory Function:  (no asymmetry or sig weakness observed) Motor Speech Overall Motor Speech: Appears within functional limits for tasks assessed Respiration: Within functional limits Phonation: Normal Resonance: Within functional limits Articulation: Within functional limitis  Intelligibility: Intelligible Motor Planning: Witnin functional limits   GO     Houston Siren 05/22/2014, 1:18 PM   Orbie Pyo Colvin Caroli.Ed [employer] [phone]

## 2014-05-22 NOTE — Evaluation (Addendum)
Physical Therapy Evaluation Patient Details Name: Tracy Reeves MRN: [id number] DOB: 1922-06-15 Today's Date: 05/22/2014   History of Present Illness  81 year old female with unknown past medical history HTN, shingles presents after unwitnessed fall and loss of consciousness , TBI/right frontal ICC/L parietal ICC/L temporal ICC at middle cranial fossa, R max sinus and skull base FXs,Skull base FX with associated hematoma at R orbital apex, R forehead lac and scalp hematoma.    Clinical Impression  Patient demonstrates deficits in functional mobility as indicated below. Will need continued skilled PT to address deficits and maximize function. Will see as indicated and progress as tolerated. OF NOTE; Patient currently presenting with extensive cognitive deficits impacting ability to follow commands.  Behaviors varying at this time from RLA IV emerging level V (no agitation noted).    Follow Up Recommendations CIR    Equipment Recommendations  Other (comment) (tbd)    Recommendations for Other Services Rehab consult     Precautions / Restrictions Precautions Precautions: Fall Restrictions Weight Bearing Restrictions: No      Mobility  Bed Mobility Overal bed mobility: Needs Assistance;+2 for physical assistance Bed Mobility: Supine to Sit     Supine to sit: Total assist;+2 for physical assistance     General bed mobility comments: Max assist with use of chuck pad, patient at times fighting against movement secondary to cognitive defictis  Transfers Overall transfer level: Needs assistance Equipment used: 2 person hand held assist Transfers: Sit to/from Stand Sit to Stand: Mod assist         General transfer comment: Patient able to power up to standing, moderate assist for stability, patient with multi-directional sway  Ambulation/Gait Ambulation/Gait assistance: Mod assist;+2 physical assistance;+2 safety/equipment Ambulation Distance (Feet): 50 Feet Assistive  device: 2 person hand held assist Gait Pattern/deviations: Step-through pattern;Decreased stride length;Drifts right/left;Narrow base of support Gait velocity: decreased Gait velocity interpretation: Below normal speed for age/gender General Gait Details: Patient with signifcant instability during ambulation, Patient with multi-directions sway and required max cues to open eyes and attend to task.   Stairs            Wheelchair Mobility    Modified Rankin (Stroke Patients Only) Modified Rankin (Stroke Patients Only) Pre-Morbid Rankin Score: No symptoms Modified Rankin: Moderately severe disability     Balance Overall balance assessment: Needs assistance;History of Falls Sitting-balance support: Feet supported Sitting balance-Leahy Scale: Poor Sitting balance - Comments: able to self support breifly, but required min to moderate assist the majority of the time sitting EOB. Postural control:  (anterior lean) Standing balance support: Bilateral upper extremity supported;During functional activity Standing balance-Leahy Scale: Zero Standing balance comment: require +2 assist to maintain balance                             Pertinent Vitals/Pain Pain Assessment: Faces Faces Pain Scale: Hurts even more Pain Location:  (unknown) Pain Descriptors / Indicators: Grimacing Pain Intervention(s): Limited activity within patient's tolerance;Monitored during session;Repositioned;Relaxation    Home Living Family/patient expects to be discharged to:: Inpatient rehab Living Arrangements: Alone               Additional Comments: no family available to provide information    Prior Function Level of Independence: Independent         Comments: prior to admission patient was independent, per SLP, patient was at [employer] earlier in the day      Hand Dominance   Dominant Hand:  (  unknown)    Extremity/Trunk Assessment   Upper Extremity Assessment: Difficult to  assess due to impaired cognition (appears symmetrical, and demonstrates use of BUE)           Lower Extremity Assessment: RLE deficits/detail;Difficult to assess due to impaired cognition RLE Deficits / Details: Active use BLEs, RLE bruising and swelling around knee    Cervical / Trunk Assessment: Kyphotic  Communication   Communication: Expressive difficulties  Cognition Arousal/Alertness: Lethargic Behavior During Therapy: Impulsive Overall Cognitive Status: Impaired/Different from baseline Area of Impairment: Orientation;Attention;Memory;Following commands;Safety/judgement;Awareness;Problem solving;[place] level Orientation Level: Disoriented to;Place;Time;Situation Current Attention Level: Focused Memory: Decreased short-term memory Following Commands: Follows one step commands inconsistently Safety/Judgement: Decreased awareness of safety;Decreased awareness of deficits Awareness:  (pre-intellectual) Problem Solving: Slow processing;Decreased initiation;Difficulty sequencing;Requires verbal cues;Requires tactile cues General Comments: patient was able to stand on command, but could not follow other commands given, at times responding to questions but not appropriately.    General Comments General comments (skin integrity, edema, etc.): Pt limited by cognition and MS at this point    Exercises        Assessment/Plan    PT Assessment Patient needs continued PT services  PT Diagnosis Difficulty walking;Abnormality of gait;Acute pain;Altered mental status   PT Problem List Decreased strength;Decreased range of motion;Decreased activity tolerance;Decreased balance;Decreased mobility;Decreased coordination;Decreased cognition;Decreased safety awareness;Decreased knowledge of precautions;Pain  PT Treatment Interventions DME instruction;Gait training;Functional mobility training;Therapeutic activities;Therapeutic exercise;Balance training;Cognitive remediation;Patient/family  education   PT Goals (Current goals can be found in the Care Plan section) Acute Rehab PT Goals Patient Stated Goal: none stated. PT Goal Formulation: Patient unable to participate in goal setting Time For Goal Achievement: 06/05/14 Potential to Achieve Goals: Fair    Frequency Min 3X/week   Barriers to discharge        Co-evaluation               End of Session Equipment Utilized During Treatment: Gait belt Activity Tolerance: Patient tolerated treatment well Patient left: in chair;with call bell/phone within reach;with chair alarm set;with nursing/sitter in room Nurse Communication: Mobility status         Time: 1155-1220 PT Time Calculation (min) (ACUTE ONLY): 25 min   Charges:   PT Evaluation $Initial PT Evaluation Tier I: 1 Procedure     PT G CodesDuncan Dull 2014/06/15, 12:53 PM Alben Deeds, [place] DPT  [phone]

## 2014-05-22 NOTE — Consult Note (Signed)
Physical Medicine and Rehabilitation Consult Reason for Consult: TBI after a fall Referring Physician: Trauma services   HPI: Tracy Reeves is a 81 y.o. right handed female admitted 05/20/2014 after being found down at the bottom of 3 stairs in her car port. She was minimally responsive at the scene but breathing spontaneously with noted scalp laceration. By report patient lives alone and was independent prior to admission. CT of the head showed hematoma right occipital orbital apex with fractures involving the right zygomatic arch, right frontal lobe and extending to the greater wing of the sphenoid. There is extracranial hematoma overlying the right frontal bone as well as underlying parenchymal contusion in the right frontal lobe left parietal lobe and left temporal lobe middle cranial fossa. CT cervical spine negative. Neurosurgery Dr. Kathyrn Sheriff consulted and advised conservative care and monitor with repeat cranial CT scan 05/21/2014 that remains stable. Ongoing bouts of confusion and agitation as well as restlessness. Patient is currently nothing by mouth. Physical and occupational therapy evaluations completed 05/22/2014 with recommendations of physical medicine rehabilitation consult.  Patient can be cued to open eyes, minimal verbal output, Review of Systems  Unable to perform ROS: mental acuity   Past Medical History  Diagnosis Date  . Hypertension   . Shingles    History reviewed. No pertinent past surgical history. History reviewed. No pertinent family history. Social History:  reports that she has never smoked. She does not have any smokeless tobacco history on file. Her alcohol and drug histories are not on file. Allergies: No Known Allergies Medications Prior to Admission  Medication Sig Dispense Refill  . amLODipine (NORVASC) 5 MG tablet Take 5 mg by mouth daily.    Marland Kitchen levothyroxine (SYNTHROID, LEVOTHROID) 25 MCG tablet Take 25 mcg by mouth daily before breakfast.      . LORazepam (ATIVAN) 0.5 MG tablet Take 0.5 mg by mouth 3 (three) times daily as needed for anxiety.    Marland Kitchen losartan (COZAAR) 50 MG tablet Take 50 mg by mouth daily.    Marland Kitchen nystatin-triamcinolone (MYCOLOG II) cream Apply 1 application topically 3 (three) times daily as needed (vaginal burning).    Marland Kitchen PARoxetine (PAXIL) 10 MG tablet Take 10 mg by mouth at bedtime.    . traMADol (ULTRAM) 50 MG tablet Take 50 mg by mouth every 6 (six) hours as needed (pain).      Home: Home Living Family/patient expects to be discharged to:: Inpatient rehab Living Arrangements: Alone Additional Comments: no family available to provide information  Lives With: Alone  Functional History: Prior Function Level of Independence: Independent Comments: prior to admission patient was independent, per SLP, patient was at [employer] earlier in the day  Functional Status:  Mobility: Bed Mobility Overal bed mobility: Needs Assistance, +2 for physical assistance Bed Mobility: Supine to Sit Supine to sit: Total assist, +2 for physical assistance General bed mobility comments: Max assist with use of chuck pad, patient at times fighting against movement secondary to cognitive defictis Transfers Overall transfer level: Needs assistance Equipment used: 2 person hand held assist Transfers: Sit to/from Stand Sit to Stand: Mod assist General transfer comment: Patient able to power up to standing, moderate assist for stability, patient with multi-directional sway Ambulation/Gait Ambulation/Gait assistance: Mod assist, +2 physical assistance, +2 safety/equipment Ambulation Distance (Feet): 50 Feet Assistive device: 2 person hand held assist Gait Pattern/deviations: Step-through pattern, Decreased stride length, Drifts right/left, Narrow base of support Gait velocity: decreased Gait velocity interpretation: Below normal speed for age/gender General  Gait Details: Patient with signifcant instability during ambulation, Patient  with multi-directions sway and required max cues to open eyes and attend to task.     ADL: ADL Overall ADL's : Needs assistance/impaired Eating/Feeding: NPO Grooming: Wash/dry face, Oral care, Maximal assistance, Sitting Grooming Details (indicate cue type and reason): Pt unsure what to do with toothbrush until it was put into pt's mouth.  Once in her mouth she was able to brush but not thouroughly. Upper Body Bathing: Total assistance, Sitting Upper Body Bathing Details (indicate cue type and reason): pt unable to follow commands to bathe at this time. Pt can start the task but has not initiation to follow through and stops tasks after 2-5 seconds. Lower Body Bathing: Total assistance, Sit to/from stand Lower Body Bathing Details (indicate cue type and reason): Pt unable to hold attn span long enough to bathe self and not following commands. Upper Body Dressing : Total assistance Lower Body Dressing: Total assistance, +2 for physical assistance, +2 for safety/equipment Toilet Transfer: Moderate assistance, Regular Toilet Toilet Transfer Details (indicate cue type and reason): Pt could walk to bathroom but has no concept of safety or awareness of her deficits. Toileting- Clothing Manipulation and Hygiene: Total assistance, +2 for physical assistance, Sit to/from stand Toileting - Clothing Manipulation Details (indicate cue type and reason): Pt unsafe on her feet and needs one person to assist her to stand and another to manage her clothing. Functional mobility during ADLs: Maximal assistance, +2 for physical assistance General ADL Comments: pt limited by cognitive function at this point.  Pt seems to be moving all 4 extremities well but cannot follow commands or problem solve through any adls at this point.  Cognition: Cognition Overall Cognitive Status: Impaired/Different from baseline Arousal/Alertness: Awake/alert Orientation Level: Disoriented to place, Disoriented to time, Disoriented  to situation, Disoriented to person Attention: Sustained Sustained Attention: Impaired Sustained Attention Impairment: Verbal basic, Functional basic Memory: Impaired Memory Impairment: Storage deficit, Retrieval deficit, Decreased recall of new information, Decreased short term memory, Prospective memory Awareness: Impaired Awareness Impairment: Intellectual impairment, Emergent impairment, Anticipatory impairment Problem Solving: Impaired Problem Solving Impairment: Verbal basic, Functional basic Behaviors: Restless, Impulsive Safety/Judgment: Impaired Rancho Los Amigos Scales of Cognitive Functioning: Confused/inappropriate/non-agitated Cognition Arousal/Alertness: Lethargic Behavior During Therapy: Impulsive Overall Cognitive Status: Impaired/Different from baseline Area of Impairment: Orientation, Attention, Memory, Following commands, Safety/judgement, Awareness, Problem solving, Rancho level Orientation Level: Disoriented to, Place, Time, Situation Current Attention Level: Focused Memory: Decreased short-term memory Following Commands: Follows one step commands inconsistently Safety/Judgement: Decreased awareness of safety, Decreased awareness of deficits Awareness:  (pre-intellectual) Problem Solving: Slow processing, Decreased initiation, Difficulty sequencing, Requires verbal cues, Requires tactile cues General Comments: patient was able to stand on command, but could not follow other commands given, at times responding to questions but not appropriately.  Blood pressure 133/58, pulse 73, temperature 98.8 F (37.1 C), temperature source Oral, resp. rate 15, height 5' (1.524 m), weight 54 kg (119 lb 0.8 oz), SpO2 100 %. Physical Exam  HENT:  Multiple bruising to the face as well as around the right eye and laceration of the forehead  Eyes:  Pupils sluggish to light  Neck: Normal range of motion. Neck supple. No thyromegaly present.  Cardiovascular: Normal rate and regular  rhythm.   Respiratory: Effort normal and breath sounds normal. No respiratory distress.  GI: Soft. Bowel sounds are normal. She exhibits no distension.  Neurological:  Patient is arousable she would state her name but would easily fall back asleep during exam.  Very inconsistent to follow commands  Skin: Skin is warm and dry.  Motor strength is 4/5 in the right deltoid, biceps, triceps, grip, 5/5 in left deltoid, biceps, triceps, grip Motion strength is 4 minus bilateral hip flexors knee extensors ankle dorsiflexors Sensation able to sense pinch in the right upper and right lower extremity. Difficulty with following two-step commands, is able to follow 1 step command with cueing mainly visual cueing.  Results for orders placed or performed during the hospital encounter of 05/20/14 (from the past 24 hour(s))  Provider-confirm verbal Blood Bank order - Type & Screen, RBC, FFP; 2 Units; Order taken: 05/20/2014; 3:40 PM; Level 1 Trauma, Emergency Release     Status: None   Collection Time: 05/22/14 11:00 AM  Result Value Ref Range   Blood product order confirm MD AUTHORIZATION REQUESTED    Ct Head Without Contrast  05/21/2014   CLINICAL DATA:  Followup traumatic brain injury.  EXAM: CT HEAD WITHOUT CONTRAST  TECHNIQUE: Contiguous axial images were obtained from the base of the skull through the vertex without intravenous contrast.  COMPARISON:  Head CT from 1 day prior  FINDINGS: Skull and Sinuses:Right parietal calvarial fracture shows no interval displacement/depression. A central skullbase fracture, traversing the right pterion and sphenoid sinuses is unchanged. No visible pneumocephalus. Right zygomaticomaxillary complex fracture, right orbital hemorrhage, and right proptosis are stable from previous.  Brain: As expected, blooming of bilateral temporal lobe hemorrhagic contusions, with increased hemorrhage and surrounding edema. The contusion is more extensive on the left, with the largest  hemorrhagic area measuring up to 2.5 cm diameter. There is no significant increase in mass effect. No herniation.  Scattered small volume subarachnoid hemorrhage is similar to previous, best visualized around the cerebral convexities. There is no hydrocephalus.  Thin subdural hematoma along the left tentorium and in the left occipital region is stable. The left occipital clot measures up to 8 mm in thickness.  No evidence of herniation, hydrocephalus, or infarct. There is brain atrophy and chronic small vessel disease, mild for age.  IMPRESSION: 1. Left greater than right temporal lobe contusions with progressive hemorrhage and edema. No midline shift or herniation. 2. Similar small volume subarachnoid and subdural hemorrhage. 3. Calvarial, skullbase, and facial fractures as previously described.   Electronically Signed   By: Monte Fantasia M.D.   On: 05/21/2014 06:11   Ct Head Wo Contrast  05/20/2014   CLINICAL DATA:  Fall down 3 steps in her garage, landed head 1st on concrete floor, found unresponsive, right head laceration with bruising to right eye  EXAM: CT HEAD WITHOUT CONTRAST  CT CERVICAL SPINE WITHOUT CONTRAST  TECHNIQUE: Multidetector CT imaging of the head and cervical spine was performed following the standard protocol without intravenous contrast. Multiplanar CT image reconstructions of the cervical spine were also generated.  COMPARISON:  None.  FINDINGS: CT HEAD FINDINGS  Extracranial hematoma overlying the right frontal bone (series 3/image 22).  Underlying parenchymal contusion/subarachnoid hemorrhage in the right frontal lobe (series 3/image 20) with associated parenchymal contusion in the left parietal lobe (series 3/ image 17).  Additional subarachnoid hemorrhage in the left frontal lobe (series 3/ image 19).  Additional layering hemorrhage/parenchymal contusion in the left upper lobe/ left middle cranial fossa (series 3/images 8-9).  Layering fluid/ hemorrhage in the right maxillary sinus  (series 3/image 3). Suspected associated nondisplaced fracture involving the anterior wall of the right maxillary sinus (series 4/image 4).  Additional hemorrhage in the right orbital apex (series 3/ image 6) with  associated proptosis of the right globe. Associated fracture involving the zygomatic arch. Fracture involving the right frontal bone (series 4/image 41) and extending through the greater wing of the sphenoid (series 4/ images 10-14).  Involvement of the bilateral sphenoid sinuses with associated hemorrhage.  No mass lesion, mass effect, or midline shift.  No CT evidence of acute infarction.  Basilar cisterns are patent.  Small vessel ischemic changes.  Intracranial atherosclerosis.  Cerebral volume is age appropriate.  No ventriculomegaly.  No evidence of intraventricular hemorrhage.  CT CERVICAL SPINE FINDINGS  Normal cervical lordosis.  No evidence of fracture or dislocation. Vertebral body heights are maintained. Dens appears intact.  No prevertebral soft tissue swelling.  4 mm anterolisthesis of C4 on C5.  3 mm anterolisthesis of C5 on C6.  Moderate multilevel degenerative changes.  IMPRESSION: Hematoma in the right orbital apex with associated proptosis.  Fractures involving the right zygomatic arch, right frontal bone, and extending through the greater wing of the sphenoid. Associated fractures involving the right maxillary sinus and bilateral sphenoid sinuses.  Extracranial hematoma overlying the right frontal bone.  Underlying parenchymal contusion in the right frontal lobe, left parietal lobe, and left temporal lobe/middle cranial fossa. Additional subarachnoid hemorrhage in the left frontal lobe.  No evidence of acute traumatic injury to the cervical spine.  Critical Value/emergent results were discussed in person at the time of interpretation on 05/20/2014 at 4:35 pm to Dr. [name], who verbally acknowledged these results.   Electronically Signed   By: Julian Hy M.D.   On:  05/20/2014 17:05   Ct Cervical Spine Wo Contrast  05/20/2014   CLINICAL DATA:  Fall down 3 steps in her garage, landed head 1st on concrete floor, found unresponsive, right head laceration with bruising to right eye  EXAM: CT HEAD WITHOUT CONTRAST  CT CERVICAL SPINE WITHOUT CONTRAST  TECHNIQUE: Multidetector CT imaging of the head and cervical spine was performed following the standard protocol without intravenous contrast. Multiplanar CT image reconstructions of the cervical spine were also generated.  COMPARISON:  None.  FINDINGS: CT HEAD FINDINGS  Extracranial hematoma overlying the right frontal bone (series 3/image 22).  Underlying parenchymal contusion/subarachnoid hemorrhage in the right frontal lobe (series 3/image 20) with associated parenchymal contusion in the left parietal lobe (series 3/ image 17).  Additional subarachnoid hemorrhage in the left frontal lobe (series 3/ image 19).  Additional layering hemorrhage/parenchymal contusion in the left upper lobe/ left middle cranial fossa (series 3/images 8-9).  Layering fluid/ hemorrhage in the right maxillary sinus (series 3/image 3). Suspected associated nondisplaced fracture involving the anterior wall of the right maxillary sinus (series 4/image 4).  Additional hemorrhage in the right orbital apex (series 3/ image 6) with associated proptosis of the right globe. Associated fracture involving the zygomatic arch. Fracture involving the right frontal bone (series 4/image 41) and extending through the greater wing of the sphenoid (series 4/ images 10-14).  Involvement of the bilateral sphenoid sinuses with associated hemorrhage.  No mass lesion, mass effect, or midline shift.  No CT evidence of acute infarction.  Basilar cisterns are patent.  Small vessel ischemic changes.  Intracranial atherosclerosis.  Cerebral volume is age appropriate.  No ventriculomegaly.  No evidence of intraventricular hemorrhage.  CT CERVICAL SPINE FINDINGS  Normal cervical  lordosis.  No evidence of fracture or dislocation. Vertebral body heights are maintained. Dens appears intact.  No prevertebral soft tissue swelling.  4 mm anterolisthesis of C4 on C5.  3 mm anterolisthesis of C5 on  C6.  Moderate multilevel degenerative changes.  IMPRESSION: Hematoma in the right orbital apex with associated proptosis.  Fractures involving the right zygomatic arch, right frontal bone, and extending through the greater wing of the sphenoid. Associated fractures involving the right maxillary sinus and bilateral sphenoid sinuses.  Extracranial hematoma overlying the right frontal bone.  Underlying parenchymal contusion in the right frontal lobe, left parietal lobe, and left temporal lobe/middle cranial fossa. Additional subarachnoid hemorrhage in the left frontal lobe.  No evidence of acute traumatic injury to the cervical spine.  Critical Value/emergent results were discussed in person at the time of interpretation on 05/20/2014 at 4:35 pm to Dr. [name], who verbally acknowledged these results.   Electronically Signed   By: Julian Hy M.D.   On: 05/20/2014 17:05   Dg Pelvis Portable  05/20/2014   CLINICAL DATA:  Level 1 trauma  EXAM: PORTABLE PELVIS 1-2 VIEWS  COMPARISON:  None.  FINDINGS: Degenerative changes in the hips bilaterally with joint space narrowing and spurring. Degenerative spurring scratch head degenerative changes in the visualized lower lumbar spine. No fracture, subluxation or dislocation.  IMPRESSION: No acute bony abnormality.   Electronically Signed   By: Rolm Baptise M.D.   On: 05/20/2014 16:44   Dg Chest Portable 1 View  05/20/2014   CLINICAL DATA:  Level 1 trauma.  EXAM: PORTABLE CHEST - 1 VIEW  COMPARISON:  None.  FINDINGS: Elevation of the right hemidiaphragm. Low lung volumes. Heart is borderline in size. No confluent airspace opacities. No effusions. No acute bony abnormality.  IMPRESSION: Elevated right hemidiaphragm.  No acute findings.   Electronically  Signed   By: Rolm Baptise M.D.   On: 05/20/2014 16:44   Ct Maxillofacial Wo Cm  05/20/2014   CLINICAL DATA:  Found down and unresponsive. Maxillofacial fractures noted on the CT of the head and cervical spine. Further evaluation requested. Initial encounter.  EXAM: CT MAXILLOFACIAL WITHOUT CONTRAST  TECHNIQUE: Multidetector CT imaging of the maxillofacial structures was performed. Multiplanar CT image reconstructions were also generated. A small metallic BB was placed on the right temple in order to reliably differentiate right from left.  COMPARISON:  None.  FINDINGS: As noted on recent CT of the head and cervical spine, there is a minimally displaced tetrapod fracture involving the right zygomaticomaxillary complex. The superior buttress fracture extends across the suture, while there is a mildly displaced fracture through the right orbital floor and superior aspect of the lateral wall of the right maxillary sinus. The fracture extends across the anterior wall of the right maxillary sinus. There is a minimally displaced fracture extending across the right zygomatic arch, and as described on prior CT, there is extension of a fracture line across the base of the skull, primarily on the right, though also extending to the left, passing through the sphenoid sinus on both sides. This does not appear to involve the more posterior vascular foramina.  There is right-sided proptosis. Underlying intraorbital hematoma and air is seen tracking along the posterior aspect of the orbit, measuring perhaps 1.9 x 0.5 x 0.8 cm, with poor characterization of the adjacent inferior rectus muscle. The right optic globe appears grossly intact. The left orbit is unremarkable in appearance.  Blood is noted filling the right maxillary sinus and sphenoid sinus, and partially filling the nasal passages. The remaining paranasal sinuses and mastoid air cells are well aerated.  The mandible appears intact. The nasal bone is unremarkable in  appearance. The visualized dentition demonstrates no acute  abnormality.  Intraparenchymal hemorrhage at the left temporal lobe is better defined than on the prior study, and appears to have increased mildly in size, measuring 1.9 x 1.7 cm. Mild left-sided subdural blood is suggested along the tentorium cerebelli. Parenchymal contusion is again noted along the left parietal lobe.  Mild soft tissue swelling is noted about the right orbit, and overlying the right maxilla, with minimal soft tissue air overlying the right maxilla. The parapharyngeal fat planes are preserved. The nasopharynx, oropharynx and hypopharynx are unremarkable in appearance. The visualized portions of the valleculae and piriform sinuses are grossly unremarkable.  The parotid and visualized portions of the submandibular glands are within normal limits. No cervical lymphadenopathy is seen.  IMPRESSION: 1. Minimally displaced tetrapod fracture of the right zygomaticomaxillary complex, described in further detail above, with blood filling the right maxillary sinus and sphenoid sinus, and partially filling the nasal passages. 2. Fracture line extending across the base of the skull, more prominent on the right, though also extending to the left, passing through the sphenoid sinus on both sides. 3. Right-sided proptosis noted. This reflects underlying intraorbital hematoma and air tracking along the posterior aspect of the orbit, measuring approximately 1.9 x 0.5 x 0.8 cm, with poor characterization of the adjacent posterior aspect of the inferior rectus muscle, though the anterior aspect of the muscle appears intact. 4. Intraparenchymal hemorrhage at the left temporal lobe is better defined, and appears to have increased mildly in size, measuring 1.9 x 1.7 cm. 5. Mild left-sided subdural blood noted along the tentorium cerebelli. 6. Parenchymal contusion again noted along the left parietal lobe. 7. Mild soft tissue swelling about the right orbit, and  overlying the right maxilla, with minimal associated soft tissue air overlying the right maxilla.  These results were called by telephone at the time of interpretation on 05/20/2014 at 5:49 pm to Dr. Roxanne Gates, who verbally acknowledged these results.   Electronically Signed   By: Garald Balding M.D.   On: 05/20/2014 17:50    Assessment/Plan: Diagnosis: Fall resulting in parenchymal contusions right frontal, left parietal and left temporal. 1. Does the need for close, 24 hr/day medical supervision in concert with the patient's rehab needs make it unreasonable for this patient to be served in a less intensive setting? Yes 2. Co-Morbidities requiring supervision/potential complications: Receptive aphasia, mild right hemiparesis, apraxia 3. Due to bladder management, bowel management, safety, skin/wound care, disease management, medication administration, pain management and patient education, does the patient require 24 hr/day rehab nursing? Yes 4. Does the patient require coordinated care of a physician, rehab nurse, PT (1-2 hrs/day, 5 days/week), OT (1-2 hrs/day, 5 days/week) and SLP (0.5-1 hrs/day, 5 days/week) to address physical and functional deficits in the context of the above medical diagnosis(es)? Yes Addressing deficits in the following areas: balance, endurance, locomotion, strength, transferring, bowel/bladder control, bathing, dressing, feeding, grooming, toileting, cognition, speech, language and swallowing 5. Can the patient actively participate in an intensive therapy program of at least 3 hrs of therapy per day at least 5 days per week? Potentially 6. The potential for patient to make measurable gains while on inpatient rehab is fair 7. Anticipated functional outcomes upon discharge from inpatient rehab are supervision  with PT, supervision with OT, supervision with SLP. 8. Estimated rehab length of stay to reach the above functional goals is: 12-16 days 9. Does the patient have  adequate social supports and living environment to accommodate these discharge functional goals? Potentially 10. Anticipated D/C setting: Home 11. Anticipated  post D/C treatments: HH therapy 12. Overall Rehab/Functional Prognosis: fair  RECOMMENDATIONS: This patient's condition is appropriate for continued rehabilitative care in the following setting: CIR once able to tolerate 3 hours per day Patient has agreed to participate in recommended program. N/A Note that insurance prior authorization may be required for reimbursement for recommended care.  Comment: Poor endurance, difficulty with following commands    05/22/2014

## 2014-05-22 NOTE — Progress Notes (Signed)
Inpatient Rehabilitation  We received a screen request for possible IP Rehab admission.  Rehab consult has already been ordered.  Once completed by rehab MD, the admissions coordinator will follow up with patient.  Please call if questions.  [place] Admissions Coordinator Cell [phone] Office [phone]

## 2014-05-22 NOTE — Evaluation (Addendum)
Clinical/Bedside Swallow Evaluation Patient Details  Name: Tracy Reeves MRN: [id number] Date of Birth: Jun 04, 1922  Today's Date: 05/22/2014 Time: SLP Start Time (ACUTE ONLY): 6283 SLP Stop Time (ACUTE ONLY): 0927 SLP Time Calculation (min) (ACUTE ONLY): 21 min  Past Medical History:  Past Medical History  Diagnosis Date  . Hypertension   . Shingles    Past Surgical History: History reviewed. No pertinent past surgical history. HPI:  81 year old female with unknown past medical history HTN, shingles presents after unwitnessed fall and loss of consciousness from height of 3 feet. CT  Underlying parenchymal contusion in the right frontal lobe, left parietal lobe, and left temporal lobe/middle cranial fossa. Additional subarachnoid hemorrhage in the left frontal lobePt remains GCS 13-14. Hematoma in the right orbital apex with associated proptosis. Fractures involving the right zygomatic arch, right frontal bone, and extending through the greater wing of the sphenoid. Associated fractures involving the right maxillary sinus and bilateral sphenoid sinuses. Extracranial hematoma overlying the right frontal bone.   Assessment / Plan / Recommendation Clinical Impression  Pt is cooperative, however exhibiting significant confusion, pulling at leads/sheets, poor working memory and unable to follow commands for oral-motor assessment/cough or volitional swallow. Oral impairments included decreased awareness, delayed transit and expectorated puree and thin at end of study. Presently not safe for regular po's due to level of confusion from TBI. Reccommend continue NPO, oral care and ice chips intermittently. Will follow up and likely be able to initiate po's next date.      Aspiration Risk   (moderate)    Diet Recommendation Ice chips PRN after oral care        Other  Recommendations     Follow Up Recommendations  Inpatient Rehab    Frequency and Duration min 2x/week  2 weeks   Pertinent  Vitals/Pain none         Swallow Study            Oral/Motor/Sensory Function Overall Oral Motor/Sensory Function:  (no asymmetry or sig weakness observed)   Ice Chips Ice chips: Impaired Presentation: Spoon Oral Phase Impairments: Reduced lingual movement/coordination;Impaired anterior to posterior transit;Poor awareness of bolus Oral Phase Functional Implications: Prolonged oral transit Pharyngeal Phase Impairments: Suspected delayed Swallow   Thin Liquid Thin Liquid: Impaired Presentation: Cup;Straw Oral Phase Impairments: Reduced lingual movement/coordination;Reduced labial seal Oral Phase Functional Implications: Right anterior spillage;Prolonged oral transit Pharyngeal  Phase Impairments: Suspected delayed Swallow;Decreased hyoid-laryngeal movement    Nectar Thick Nectar Thick Liquid: Not tested   Honey Thick Honey Thick Liquid: Not tested   Puree Puree: Impaired Presentation: Spoon Oral Phase Impairments: Reduced labial seal Oral Phase Functional Implications: Prolonged oral transit;Oral holding (expectorated bolus x 1) Pharyngeal Phase Impairments: Suspected delayed Swallow;Decreased hyoid-laryngeal movement   Solid   GO    Solid: Not tested       Tracy Reeves 05/22/2014,10:06 AM  Tracy Reeves.Ed [employer] [phone]

## 2014-05-22 NOTE — Progress Notes (Signed)
  Subjective: Does not offer complaint  Objective: Vital signs in last 24 hours: Temp:  [98 F (36.7 C)-100.2 F (37.9 C)] 98.9 F (37.2 C) (03/01 0348) Pulse Rate:  [72-94] 72 (03/01 0600) Resp:  [14-27] 15 (03/01 0600) BP: (117-145)/(52-94) 134/52 mmHg (03/01 0600) SpO2:  [94 %-100 %] 100 % (03/01 0600)    Intake/Output from previous day: 02/29 0701 - 03/01 0700 In: 2350 [I.V.:1725] Out: 1025 [Urine:1025] Intake/Output this shift:    General appearance: no distress Head: R periorbital and facial ecchymoses Resp: clear to auscultation bilaterally Cardio: regular rate and rhythm GI: soft, NT, ND Neuro: PERL 43mm, F/C but has developed expressive aphasia, occasionally answers yuh, mostly repeats an unintelligible word  Lab Results:   Recent Labs  05/20/14 1601 05/20/14 1611 05/21/14 0244  WBC 8.5  --  14.2*  HGB 13.0 14.6 13.1  HCT 40.0 43.0 39.9  PLT 264  --  212   BMET  Recent Labs  05/20/14 1601 05/20/14 1611 05/21/14 0244  NA 137 139 134*  K 4.0 4.0 4.1  CL 102 100 101  CO2 30  --  21  GLUCOSE 120* 119* 205*  BUN 18 21 13   CREATININE 1.25* 1.20* 0.98  CALCIUM 9.1  --  8.5   PT/INR  Recent Labs  05/20/14 1601 05/21/14 0244  LABPROT 13.9 14.4  INR 1.06 1.10    Anti-infectives: Anti-infectives    None      Assessment/Plan: Fall TBI/right frontal ICC/L parietal ICC/L temporal ICC at middle cranial fossa - per Dr. Kathyrn Sheriff, F/U [facility] yesterday showed some increase in size of ICCs, now has significant expressive aphasia - likely due to L temporal ICC with edema. Watch Na. TBI team evals P R max sinus and skull base FXs - Dr. Migdalia Dk is evaluating Skull base FX with associated hematoma at R orbital apex - eye pressures were OK so nothing needed per opthalmology R forehead lac and scalp hematoma FEN - watch Na, will see what ST says about PO VTE - PAS Dispo - ICU  LOS: 2 days    Shaniqwa Horsman E 05/22/2014

## 2014-05-22 NOTE — Evaluation (Signed)
Occupational Therapy Evaluation Patient Details Name: Tracy Reeves MRN: [id number] DOB: Feb 25, 1923 Today's Date: 05/22/2014    History of Present Illness 81 year old female with unknown past medical history HTN, shingles presents after unwitnessed fall and loss of consciousness , TBI/right frontal ICC/L parietal ICC/L temporal ICC at middle cranial fossa, R max sinus and skull base FXs,Skull base FX with associated hematoma at R orbital apex, R forehead lac and scalp hematoma.     Clinical Impression   Pt admitted with the above diagnosis and the deficits listed below. Pt would benefit from cont OT to increase cognitive functioning so pt can eventually return home with 24/7 S from family.  Pt may need CIR in between due to the severity of her cognitive/planning deficits. Family is supportive.     Follow Up Recommendations  CIR;Supervision/Assistance - 24 hour    Equipment Recommendations  Tub/shower bench    Recommendations for Other Services Rehab consult     Precautions / Restrictions Precautions Precautions: Fall Restrictions Weight Bearing Restrictions: No      Mobility Bed Mobility Overal bed mobility: Needs Assistance;+2 for physical assistance Bed Mobility: Supine to Sit     Supine to sit: Total assist;+2 for physical assistance     General bed mobility comments: Max assist with use of chuck pad, patient at times fighting against movement secondary to cognitive defictis  Transfers Overall transfer level: Needs assistance Equipment used: 2 person hand held assist Transfers: Sit to/from Stand Sit to Stand: Mod assist         General transfer comment: Patient able to power up to standing, moderate assist for stability, patient with multi-directional sway    Balance Overall balance assessment: Needs assistance;History of Falls Sitting-balance support: Feet supported Sitting balance-Leahy Scale: Poor Sitting balance - Comments: able to self support  breifly, but required min to moderate assist the majority of the time sitting EOB. Postural control:  (anterior lean) Standing balance support: Bilateral upper extremity supported;During functional activity Standing balance-Leahy Scale: Zero Standing balance comment: require +2 assist to maintain balance                            ADL Overall ADL's : Needs assistance/impaired Eating/Feeding: NPO   Grooming: Wash/dry face;Oral care;Maximal assistance;Sitting Grooming Details (indicate cue type and reason): Pt unsure what to do with toothbrush until it was put into pt's mouth.  Once in her mouth she was able to brush but not thouroughly. Upper Body Bathing: Total assistance;Sitting Upper Body Bathing Details (indicate cue type and reason): pt unable to follow commands to bathe at this time. Pt can start the task but has not initiation to follow through and stops tasks after 2-5 seconds. Lower Body Bathing: Total assistance;Sit to/from stand Lower Body Bathing Details (indicate cue type and reason): Pt unable to hold attn span long enough to bathe self and not following commands. Upper Body Dressing : Total assistance   Lower Body Dressing: Total assistance;+2 for physical assistance;+2 for safety/equipment   Toilet Transfer: Moderate assistance;Regular Glass blower/designer Details (indicate cue type and reason): Pt could walk to bathroom but has no concept of safety or awareness of her deficits. Toileting- Clothing Manipulation and Hygiene: Total assistance;+2 for physical assistance;Sit to/from stand Toileting - Clothing Manipulation Details (indicate cue type and reason): Pt unsafe on her feet and needs one person to assist her to stand and another to manage her clothing.     Functional mobility during ADLs: Maximal  assistance;+2 for physical assistance General ADL Comments: pt limited by cognitive function at this point.  Pt seems to be moving all 4 extremities well but  cannot follow commands or problem solve through any adls at this point.     Vision Vision Assessment?: Vision impaired- to be further tested in functional context Additional Comments: Pt with a hematoma above R eye from fall and inside whites of eye as well.   Perception Perception Perception Tested?: No   Praxis Praxis Praxis tested?: Not tested (needs to be tested) Praxis-Other Comments: pt not cognitively intact enough to test but feel she may have some issues with apraxia.    Pertinent Vitals/Pain Pain Assessment: Faces Faces Pain Scale: Hurts even more Pain Location:  (unknown) Pain Descriptors / Indicators: Grimacing Pain Intervention(s): Limited activity within patient's tolerance;Monitored during session;Repositioned;Relaxation     Hand Dominance  (unknown)   Extremity/Trunk Assessment Upper Extremity Assessment Upper Extremity Assessment: Difficult to assess due to impaired cognition (appears symmetrical, and demonstrates use of BUE)   Lower Extremity Assessment Lower Extremity Assessment: RLE deficits/detail;Difficult to assess due to impaired cognition RLE Deficits / Details: Active use BLEs, RLE bruising and swelling around knee RLE: Unable to fully assess due to pain   Cervical / Trunk Assessment Cervical / Trunk Assessment: Kyphotic   Communication Communication Communication: Expressive difficulties   Cognition Arousal/Alertness: Lethargic Behavior During Therapy: Impulsive Overall Cognitive Status: Impaired/Different from baseline Area of Impairment: Orientation;Attention;Memory;Following commands;Safety/judgement;Awareness;Problem solving;[place] level Orientation Level: Disoriented to;Place;Time;Situation Current Attention Level: Focused Memory: Decreased short-term memory Following Commands: Follows one step commands inconsistently Safety/Judgement: Decreased awareness of safety;Decreased awareness of deficits Awareness:  (pre-intellectual) Problem  Solving: Slow processing;Decreased initiation;Difficulty sequencing;Requires verbal cues;Requires tactile cues General Comments: patient was able to stand on command, but could not follow other commands given, at times responding to questions but not appropriately.   General Comments       Exercises       Shoulder Instructions      Home Living Family/patient expects to be discharged to:: Inpatient rehab Living Arrangements: Alone                               Additional Comments: no family available to provide information      Prior Functioning/Environment Level of Independence: Independent        Comments: prior to admission patient was independent, per SLP, patient was at [employer] earlier in the day     OT Diagnosis: Generalized weakness;Cognitive deficits;Acute pain;Apraxia   OT Problem List: Decreased strength;Decreased activity tolerance;Impaired balance (sitting and/or standing);Impaired vision/perception;Decreased cognition;Decreased safety awareness;Decreased knowledge of use of DME or AE;Pain   OT Treatment/Interventions: Self-care/ADL training;DME and/or AE instruction;Therapeutic activities;Cognitive remediation/compensation    OT Goals(Current goals can be found in the care plan section) Acute Rehab OT Goals Patient Stated Goal: none stated. OT Goal Formulation: Patient unable to participate in goal setting Time For Goal Achievement: 06/05/14 Potential to Achieve Goals: Good ADL Goals Pt Will Perform Eating: with min assist;sitting Pt Will Perform Grooming: with min assist;standing Pt Will Perform Upper Body Bathing: with min assist;sitting Pt Will Transfer to Toilet: with min assist;ambulating;regular height toilet Additional ADL Goal #1: Pt will answer 3/3 orientation questions correctly with no assist to increased environmental awareness.  OT Frequency: Min 2X/week   Barriers to D/C: Decreased caregiver support  Pt lives alone        Co-evaluation  End of Session Equipment Utilized During Treatment: Gait belt Nurse Communication: Mobility status  Activity Tolerance: Patient limited by lethargy Patient left: in chair;with call bell/phone within reach;with chair alarm set   Time: 1155-1220 OT Time Calculation (min): 25 min Charges:  OT General Charges $OT Visit: 1 Procedure OT Evaluation $Initial OT Evaluation Tier I: 1 Procedure G-Codes:    Glenford Peers 05-27-14, 12:46 PM  [phone]

## 2014-05-23 DIAGNOSIS — S02109A Fracture of base of skull, unspecified side, initial encounter for closed fracture: Secondary | ICD-10-CM | POA: Diagnosis present

## 2014-05-23 DIAGNOSIS — W19XXXA Unspecified fall, initial encounter: Secondary | ICD-10-CM | POA: Diagnosis present

## 2014-05-23 LAB — CBC
HEMATOCRIT: 41 % (ref 36.0–46.0)
Hemoglobin: 13.8 g/dL (ref 12.0–15.0)
MCH: 30.3 pg (ref 26.0–34.0)
MCHC: 33.7 g/dL (ref 30.0–36.0)
MCV: 89.9 fL (ref 78.0–100.0)
PLATELETS: 204 10*3/uL (ref 150–400)
RBC: 4.56 MIL/uL (ref 3.87–5.11)
RDW: 13.5 % (ref 11.5–15.5)
WBC: 11.3 10*3/uL — AB (ref 4.0–10.5)

## 2014-05-23 LAB — BASIC METABOLIC PANEL
Anion gap: 10 (ref 5–15)
BUN: 14 mg/dL (ref 6–23)
CO2: 24 mmol/L (ref 19–32)
CREATININE: 0.69 mg/dL (ref 0.50–1.10)
Calcium: 8.6 mg/dL (ref 8.4–10.5)
Chloride: 102 mmol/L (ref 96–112)
GFR calc Af Amer: 87 mL/min — ABNORMAL LOW (ref >90)
GFR calc non Af Amer: 75 mL/min — ABNORMAL LOW (ref >90)
Glucose, Bld: 99 mg/dL (ref 70–99)
Potassium: 2.9 mmol/L — ABNORMAL LOW (ref 3.5–5.1)
Sodium: 136 mmol/L (ref 135–145)

## 2014-05-23 NOTE — Progress Notes (Signed)
Trauma Service Note  Subjective: Patient is more awake today.  Will answer some questions.  No distress  Objective: Vital signs in last 24 hours: Temp:  [97.9 F (36.6 C)-98.9 F (37.2 C)] 98.1 F (36.7 C) (03/02 0800) Pulse Rate:  [66-84] 75 (03/02 0900) Resp:  [13-29] 15 (03/02 0900) BP: (133-165)/(55-75) 165/73 mmHg (03/02 0800) SpO2:  [93 %-100 %] 100 % (03/02 0900)    Intake/Output from previous day: 03/01 0701 - 03/02 0700 In: 1725 [I.V.:1725] Out: 1650 [Urine:1650] Intake/Output this shift: Total I/O In: 150 [I.V.:150] Out: 150 [Urine:150]  General: No acute distress.  Lungs: Clear  Abd: Benign  Extremities: No changes  Neuro: More alert, will answer questions.    Lab Results: CBC   Recent Labs  05/21/14 0244 05/23/14 0243  WBC 14.2* 11.3*  HGB 13.1 13.8  HCT 39.9 41.0  PLT 212 204   BMET  Recent Labs  05/21/14 0244 05/23/14 0243  NA 134* 136  K 4.1 2.9*  CL 101 102  CO2 21 24  GLUCOSE 205* 99  BUN 13 14  CREATININE 0.98 0.69  CALCIUM 8.5 8.6   PT/INR  Recent Labs  05/20/14 1601 05/21/14 0244  LABPROT 13.9 14.4  INR 1.06 1.10   ABG No results for input(s): PHART, HCO3 in the last 72 hours.  Invalid input(s): PCO2, PO2  Studies/Results: No results found.  Anti-infectives: Anti-infectives    None      Assessment/Plan: s/p  Advance diet will advance diet if she is able to pass swallowing evaluation.  Transfer from the ICU to 4N  LOS: 3 days   Kathryne Eriksson. Dahlia Bailiff, MD, FACS [phone] Trauma Surgeon 05/23/2014

## 2014-05-23 NOTE — Progress Notes (Signed)
Pt pulling at lines and tubes. Pt pulled out foley catheter. Will continue to assess urine output.

## 2014-05-23 NOTE — Progress Notes (Signed)
Speech Language Pathology Treatment: Dysphagia  Patient Details Name: Tracy Reeves MRN: [id number] DOB: March 11, 1923 Today's Date: 05/23/2014 Time: 1497-0263 SLP Time Calculation (min) (ACUTE ONLY): 14 min  Assessment / Plan / Recommendation Clinical Impression  Patient with improving alertness and attention positively impacting swallowing abilities. Patient able to sustain attention to self feeding task for 3-4 minutes independently. Consumption of clinician provided diagnostic po trials revealed a functional oropharyngeal swallow and oral manipulation of bolus. Trials of soft solids limited due to patient refusal. Will initiate a po diet given performance above and f/u briefly for tolerance and ability to advance solids.    HPI HPI: 81 year old female with unknown past medical history HTN, shingles presents after unwitnessed fall and loss of consciousness from height of 3 feet. CT Underlying parenchymal contusion in the right frontal lobe, left parietal lobe, and left temporal lobe/middle cranial fossa. Additional subarachnoid hemorrhage in the left frontal lobePt remains GCS 13-14. Hematoma in the right orbital apex with associated proptosis. Fractures involving the right zygomatic arch, right frontal bone, and extending through the greater wing of the sphenoid. Associated fractures involving the right maxillary sinus and bilateral sphenoid sinuses. Extracranial hematoma overlying the right frontal bone.    Pertinent Vitals Pain Assessment: No/denies pain  SLP Plan  Goals updated    Recommendations Diet recommendations: Dysphagia 3 (mechanical soft);Thin liquid Liquids provided via: Cup;Straw Medication Administration: Whole meds with liquid Supervision: Patient able to self feed;Full supervision/cueing for compensatory strategies Compensations: Slow rate;Small sips/bites Postural Changes and/or Swallow Maneuvers: Seated upright 90 degrees              General recommendations: Rehab  consult Oral Care Recommendations: Oral care BID Follow up Recommendations: Inpatient Rehab Plan: Goals updated    [place], [place] [phone]   Tracy Reeves 05/23/2014, 10:54 AM

## 2014-05-23 NOTE — Progress Notes (Signed)
Pt seen and examined. No issues overnight.  EXAM: Temp:  [97.9 F (36.6 C)-98.7 F (37.1 C)] 98.7 F (37.1 C) (03/02 1530) Pulse Rate:  [66-90] 71 (03/02 1900) Resp:  [13-24] 16 (03/02 1900) BP: (116-168)/(55-102) 139/63 mmHg (03/02 1900) SpO2:  [89 %-100 %] 95 % (03/02 1900) Intake/Output      03/02 0701 - 03/03 0700   P.O. 200   I.V. (mL/kg) 900 (16.7)   Total Intake(mL/kg) 1100 (20.4)   Urine (mL/kg/hr) 650 (0.9)   Stool 0 (0)   Total Output 650   Net +450       Urine Occurrence 4 x   Stool Occurrence 1 x    Awake, alert, oriented to person CN grossly intact Moves all extremities well  LABS: Lab Results  Component Value Date   CREATININE 0.69 05/23/2014   BUN 14 05/23/2014   NA 136 05/23/2014   K 2.9* 05/23/2014   CL 102 05/23/2014   CO2 24 05/23/2014   Lab Results  Component Value Date   WBC 11.3* 05/23/2014   HGB 13.8 05/23/2014   HCT 41.0 05/23/2014   MCV 89.9 05/23/2014   PLT 204 05/23/2014    IMPRESSION: - 81 y.o. female s/p fall with left temporal contusion, speech appears somewhat improved today  PLAN: - Cont observation per trauma. - Can f/u with me in the outpatient setting 4-6 weeks after discharge.

## 2014-05-23 NOTE — Clinical Social Work Note (Signed)
Patient still disoriented. CSW went to patient's room to assess. No family at bedside. Calls made and messages left. CSW will wait for daughter's return call.    Liz Beach MSW, [place], [place], [phone]

## 2014-05-23 NOTE — Progress Notes (Signed)
Inpatient Rehabilitation  I visited pt. At the bedside and left informational booklets for family members.  I phoned her daughter and discussed the post acute rehab alternatives, including IP Rehab and SNF. Geni Bers, daughter, has a paraplegic husband she cares for.  She does not believe she would be able to take her mom in to care for her following rehab and stated that if insurance would not cover both IP Rehab and SNF, she would prefer SNF.  I have attempted to reach out to [facility] to confirm their policy but have not received a return call. It is highly doubtful that [facility] will cover both IP Rehab and SNF and would therefore be in the best interest of the patient to go to SNF for her rehab.  My co-worker Karene Fry  [phone]) will follow up tomorrow.   I discussed with Liz Beach, [place] Admissions Coordinator Cell [phone] Office [phone]

## 2014-05-24 MED ORDER — POTASSIUM CHLORIDE IN NACL 20-0.9 MEQ/L-% IV SOLN
INTRAVENOUS | Status: DC
  Administered 2014-05-24: 21:00:00 via INTRAVENOUS
  Filled 2014-05-24 (×2): qty 1000

## 2014-05-24 MED ORDER — LOSARTAN POTASSIUM 50 MG PO TABS
50.0000 mg | ORAL_TABLET | Freq: Every day | ORAL | Status: DC
  Administered 2014-05-25: 50 mg via ORAL
  Filled 2014-05-24: qty 1

## 2014-05-24 MED ORDER — PAROXETINE HCL 20 MG PO TABS
10.0000 mg | ORAL_TABLET | Freq: Every day | ORAL | Status: DC
  Administered 2014-05-24: 10 mg via ORAL
  Filled 2014-05-24: qty 0.5

## 2014-05-24 MED ORDER — LEVOTHYROXINE SODIUM 25 MCG PO TABS
25.0000 ug | ORAL_TABLET | Freq: Every day | ORAL | Status: DC
  Administered 2014-05-25: 25 ug via ORAL
  Filled 2014-05-24: qty 1

## 2014-05-24 MED ORDER — MORPHINE SULFATE 2 MG/ML IJ SOLN: 1.0000 mg | INTRAMUSCULAR | Status: DC | PRN

## 2014-05-24 MED ORDER — AMLODIPINE BESYLATE 5 MG PO TABS
5.0000 mg | ORAL_TABLET | Freq: Every day | ORAL | Status: DC
  Administered 2014-05-25: 5 mg via ORAL
  Filled 2014-05-24: qty 1

## 2014-05-24 MED ORDER — TRAMADOL HCL 50 MG PO TABS
50.0000 mg | ORAL_TABLET | Freq: Four times a day (QID) | ORAL | Status: DC | PRN
  Administered 2014-05-24: 50 mg via ORAL
  Filled 2014-05-24: qty 1

## 2014-05-24 MED ORDER — TRAMADOL HCL 50 MG PO TABS: 50.0000 mg | ORAL_TABLET | Freq: Four times a day (QID) | ORAL | Status: DC | PRN

## 2014-05-24 MED ORDER — ACETAMINOPHEN 325 MG PO TABS: 650.0000 mg | ORAL_TABLET | Freq: Four times a day (QID) | ORAL | Status: DC | PRN

## 2014-05-24 NOTE — Progress Notes (Signed)
Patient ID: Tracy Reeves, female   DOB: 10-09-1922, 81 y.o.   MRN: [id number]    Subjective: Offers no complaint, still has some expressive aphasia  Objective: Vital signs in last 24 hours: Temp:  [98.1 F (36.7 C)-98.7 F (37.1 C)] 98.1 F (36.7 C) (03/03 0750) Pulse Rate:  [58-90] 58 (03/03 0500) Resp:  [12-24] 15 (03/03 0500) BP: (116-173)/(57-102) 173/57 mmHg (03/03 0500) SpO2:  [89 %-100 %] 98 % (03/03 0500)    Intake/Output from previous day: 03/02 0701 - 03/03 0700 In: 1775 [P.O.:200; I.V.:1575] Out: 650 [Urine:650] Intake/Output this shift:    General appearance: cooperative Head: R periorbital ecchymoses evolving Resp: clear to auscultation bilaterally Cardio: regular rate and rhythm GI: soft, NT, ND Extremities: calves soft, +DP B Neuro: answers some questions, expressive aphasia persists - when asked to name a pen she called it a TV, word finding difficulty  Lab Results: CBC   Recent Labs  05/23/14 0243  WBC 11.3*  HGB 13.8  HCT 41.0  PLT 204   BMET  Recent Labs  05/23/14 0243  NA 136  K 2.9*  CL 102  CO2 24  GLUCOSE 99  BUN 14  CREATININE 0.69  CALCIUM 8.6   PT/INR No results for input(s): LABPROT, INR in the last 72 hours. ABG No results for input(s): PHART, HCO3 in the last 72 hours.  Invalid input(s): PCO2, PO2  Studies/Results: No results found.  Anti-infectives: Anti-infectives    None      Assessment/Plan: Fall TBI/right frontal ICC/L parietal ICC/L temporal ICC at middle cranial fossa - TBI team therapies, expressive aphasia improved somewhat R max sinus and skull base FXs - per Dr. Migdalia Dk Skull base FX with associated hematoma at R orbital apex - eye pressures were OK so nothing needed per opthalmology R forehead lac and scalp hematoma FEN - check lytes in AM, D3 diet, KVO IVF VTE - PAS Dispo - 4N, CIR evaluating   LOS: 4 days    [name], MD, MPH, FACS Trauma: [phone] General Surgery:  [phone]  05/24/2014

## 2014-05-24 NOTE — Progress Notes (Signed)
Occupational Therapy Treatment Patient Details Name: Tracy Reeves MRN: [id number] DOB: 1922-10-22 Today's Date: 05/24/2014    History of present illness 81 year old female with unknown past medical history HTN, shingles presents after unwitnessed fall and loss of consciousness , TBI/right frontal ICC/L parietal ICC/L temporal ICC at middle cranial fossa, R max sinus and skull base FXs,Skull base FX with associated hematoma at R orbital apex, R forehead lac and scalp hematoma.     OT comments  This 81 yo female admitted with above presents to acute OT making progress since eval; however still has much room for improvement and will greatly benefit from continued OT at SNF. We will continue to follow acutely.  Follow Up Recommendations  SNF    Equipment Recommendations   (TBD at next venue)       Precautions / Restrictions Precautions Precautions: Fall Restrictions Weight Bearing Restrictions: No       Mobility Bed Mobility Overal bed mobility: Needs Assistance Bed Mobility: Sit to Supine     Supine to sit: Mod assist;+2 for safety/equipment Sit to supine: Supervision   General bed mobility comments: needed assist with initiation, but otherwise used trunk and upper extremities to get to EOB  Transfers Overall transfer level: Needs assistance Equipment used: 1 person hand held assist Transfers: Sit to/from Stand Sit to Stand: Mod assist            Balance Overall balance assessment: Needs assistance   Standing balance support: Bilateral upper extremity supported;During functional activity Standing balance-Leahy Scale: Poor                     ADL Overall ADL's : Needs assistance/impaired     Grooming: Wash/dry face (min A for task, Mod A for balance in standing)                   Toilet Transfer: Moderate assistance;Ambulation;Regular Toilet;Grab bars (one person HHA)   Toileting- Clothing Manipulation and Hygiene: Moderate assistance (with min  A sit<>stand)                          Cognition   Behavior During Therapy: Impulsive Overall Cognitive Status: Impaired/Different from baseline Area of Impairment: Orientation Orientation Level: Disoriented to;Place;Situation Current Attention Level: Sustained Memory: Decreased short-term memory  Following Commands: Follows one step commands with increased time (and direction at times) Safety/Judgement: Decreased awareness of safety;Decreased awareness of deficits   Problem Solving: Difficulty sequencing;Requires verbal cues;Requires tactile cues General Comments: "No, I think you are wrong"--when I told her we were at [facility] hospital --when I asked her where we were then, she could not tell me                 Pertinent Vitals/ Pain       Pain Assessment: No/denies pain Faces Pain Scale: No hurt Pain Location: unsure Pain Descriptors / Indicators: Grimacing Pain Intervention(s): Limited activity within patient's tolerance;Monitored during session         Frequency Min 2X/week     Progress Toward Goals  OT Goals(current goals can now be found in the care plan section)  Progress towards OT goals: Progressing toward goals  Acute Rehab OT Goals Patient Stated Goal: none stated.  Plan Discharge plan needs to be updated          Activity Tolerance Patient limited by fatigue   Patient Left in bed;with call bell/phone within reach;with bed alarm set  Time: 5038-8828 OT Time Calculation (min): 22 min  Charges: OT General Charges $OT Visit: 1 Procedure OT Treatments $Self Care/Home Management : 8-22 mins  Almon Register [phone] 05/24/2014, 2:27 PM

## 2014-05-24 NOTE — Progress Notes (Signed)
Physical Therapy Treatment Patient Details Name: Tracy Reeves MRN: [id number] DOB: 1922/10/18 Today's Date: 05/24/2014    History of Present Illness 81 year old female with unknown past medical history HTN, shingles presents after unwitnessed fall and loss of consciousness , TBI/right frontal ICC/L parietal ICC/L temporal ICC at middle cranial fossa, R max sinus and skull base FXs,Skull base FX with associated hematoma at R orbital apex, R forehead lac and scalp hematoma.      PT Comments    Progressing slowly, Asking some appropriate question about her situation, still not appearing to understand many explanations of her situation.  Continues to experience some positional dizziness.  Gait/ balance improving.  Follow Up Recommendations  CIR     Equipment Recommendations       Recommendations for Other Services       Precautions / Restrictions Precautions Precautions: Fall    Mobility  Bed Mobility Overal bed mobility: Needs Assistance;+2 for physical assistance Bed Mobility: Supine to Sit     Supine to sit: Mod assist;+2 for safety/equipment     General bed mobility comments: needed assist with initiation, but otherwise used trunk and upper extremities to get to EOB  Transfers Overall transfer level: Needs assistance   Transfers: Sit to/from Stand Sit to Stand: Mod assist         General transfer comment: needed assist to come forward more than assist powering up.  Ambulation/Gait Ambulation/Gait assistance: Min assist;+2 physical assistance;+2 safety/equipment Ambulation Distance (Feet): 150 Feet Assistive device: 2 person hand held assist Gait Pattern/deviations: Step-through pattern Gait velocity: decreased   General Gait Details: mild to moderate instability throughout with scissoring, wandering, some retropulsion and even more unsteadiness backing up.   Stairs            Wheelchair Mobility    Modified Rankin (Stroke Patients Only) Modified  Rankin (Stroke Patients Only) Pre-Morbid Rankin Score: No symptoms Modified Rankin: Moderately severe disability     Balance Overall balance assessment: Needs assistance Sitting-balance support: No upper extremity supported;Single extremity supported Sitting balance-Leahy Scale: Fair Sitting balance - Comments: did not accept challenge, but sat EOB without assist     Standing balance-Leahy Scale: Poor                      Cognition Arousal/Alertness: Lethargic Behavior During Therapy: Impulsive Overall Cognitive Status: Impaired/Different from baseline Area of Impairment: Orientation;Attention;Memory;Following commands;Safety/judgement;Awareness;Problem solving;[place] level Orientation Level: Disoriented to;Place;Time;Situation Current Attention Level: Focused Memory: Decreased short-term memory Following Commands: Follows one step commands with increased time Safety/Judgement: Decreased awareness of safety;Decreased awareness of deficits   Problem Solving: Slow processing;Decreased initiation;Difficulty sequencing General Comments: patient was able to stand on command, but could not follow other commands given, at times responding to questions but not appropriately.    Exercises General Exercises - Lower Extremity Hip Flexion/Marching: AROM;Strengthening;10 reps;Supine (graded resistance--warm up before mobility)    General Comments        Pertinent Vitals/Pain Pain Assessment: Faces Faces Pain Scale: Hurts little more Pain Location: unsure Pain Descriptors / Indicators: Grimacing Pain Intervention(s): Limited activity within patient's tolerance;Monitored during session    Home Living                      Prior Function            PT Goals (current goals can now be found in the care plan section) Acute Rehab PT Goals Patient Stated Goal: none stated. PT Goal Formulation: Patient unable to participate  in goal setting Time For Goal Achievement:  06/05/14 Potential to Achieve Goals: Fair Progress towards PT goals: Progressing toward goals    Frequency  Min 3X/week    PT Plan Current plan remains appropriate    Co-evaluation             End of Session   Activity Tolerance: Patient tolerated treatment well Patient left: in chair;with call bell/phone within reach;with chair alarm set     Time: 1030-1056 PT Time Calculation (min) (ACUTE ONLY): 26 min  Charges:  $Gait Training: 8-22 mins $Therapeutic Activity: 8-22 mins                    G Codes:      Latosha Gaylord, Tessie Fass 05/24/2014, 2:04 PM 05/24/2014  Donnella Sham, PT [phone]  (pager)

## 2014-05-24 NOTE — Progress Notes (Signed)
Speech Language Pathology Treatment: Dysphagia;Cognitive-Linquistic  Patient Details Name: Tracy Reeves MRN: [id number] DOB: 1923/01/13 Today's Date: 05/24/2014 Time: 0600-4599 SLP Time Calculation (min) (ACUTE ONLY): 18 min  Assessment / Plan / Recommendation Clinical Impression  Pt calm, following directions, seen for dysphagia and cognition. Functional mastication and transit with solid texture without residue. Straw sips thin consumed without difficulty (consecutive sips). She sustained attention to speaker and task with min verbal cues (goal met). Pt continues to need mod-max verbal/visual cues for orientation to place, time and situation and max for intellectual awareness. Given verbal stimuli and reminders pt observed using call bell to when toileting completed. Dysnomia and hesitancies present during dialogue, will add appropriate goals. SLP will upgrade diet to regular texture and continue cognitive-linguistic intervention.   HPI HPI: 81 year old female with unknown past medical history HTN, shingles presents after unwitnessed fall and loss of consciousness from height of 3 feet. CT Underlying parenchymal contusion in the right frontal lobe, left parietal lobe, and left temporal lobe/middle cranial fossa. Additional subarachnoid hemorrhage in the left frontal lobePt remains GCS 13-14. Hematoma in the right orbital apex with associated proptosis. Fractures involving the right zygomatic arch, right frontal bone, and extending through the greater wing of the sphenoid. Associated fractures involving the right maxillary sinus and bilateral sphenoid sinuses. Extracranial hematoma overlying the right frontal bone.    Pertinent Vitals Pain Assessment: No/denies pain  SLP Plan  Goals updated    Recommendations Diet recommendations: Regular;Thin liquid Liquids provided via: Cup;Straw Medication Administration: Whole meds with liquid Supervision: Patient able to self feed;Full supervision/cueing  for compensatory strategies Compensations: Slow rate;Small sips/bites Postural Changes and/or Swallow Maneuvers: Seated upright 90 degrees              Oral Care Recommendations: Oral care BID Follow up Recommendations: Inpatient Rehab Plan: Goals updated    GO     Houston Siren 05/24/2014, 9:04 AM  Orbie Pyo Colvin Caroli.Ed [employer] [phone]

## 2014-05-24 NOTE — Clinical Social Work Note (Signed)
Clinical Social Work Department BRIEF PSYCHOSOCIAL ASSESSMENT 05/24/2014  Patient:  MANALI, MCELMURRY     Account Number:  [id number]     Admit date:  05/20/2014  Clinical Social Worker:  Myles Lipps  Date/Time:  05/24/2014 11:30 AM  Referred by:  RN  Date Referred:  05/23/2014 Referred for  SNF Placement   Other Referral:   Interview type:  Family Other interview type:   Spoke with patient daughter over the phone - patient remains confused    PSYCHOSOCIAL DATA Living Status:  ALONE Admitted from facility:   Level of care:   Primary support name:  Geni Bers [phone] Primary support relationship to patient:  CHILD, ADULT Degree of support available:   Patient daughter emotionally available, however physically limited due to being the primary caregiver for her husband.    CURRENT CONCERNS Current Concerns  Post-Acute Placement   Other Concerns:    SOCIAL WORK ASSESSMENT / PLAN Clinical Social Worker spoke with patient daughter over the phone to offer support and discuss patient needs at discharge.  Patient daughter states that patient had been living alone prior to this hospitalization.  Patient had just been out earlier in the day with a friend to [place] and then fell upon returning home.  Patient daughter states that patient is very independent and well kept at baseline and only has memory issues in the evenings.  Patient daughter understanding that patient will most definitely need rehab prior to return home.  CSW explained to patient daughter that patient insurance will only provide coverage to one avenue of rehab which means that SNF placement will likely be the placement option at discharge.  Patient daughter states that patient lives in [place], however patient daughter lives in [place] and would prefer placement in [place].  CSW to initiate SNF search in [facility] and follow up with patient daughter regarding available bed offers.  CSW  remains available for support and to facilitate patient discharge needs once medically stable.   Assessment/plan status:  Psychosocial Support/Ongoing Assessment of Needs Other assessment/ plan:   Information/referral to community resources:   No SBIRT completed due to patient current mental status, however patient daughter does not express concerns regarding any type of use.  Patient daughter agreeable to facility list once bed offers available and requested ALF list for future needs - CSW to provide    PATIENT'S/FAMILY'S RESPONSE TO PLAN OF CARE: Patient alert and oriented to self sitting up in the chair. Patient daughter provides good support to patient when able, however she is the primary caregiver for her husband who is a paraplegic.  Patient daughter understanding of SNF search process and agreeable with patient placement at discharge.  Patient daughter verbalized appreciation for CSW support and concern.

## 2014-05-24 NOTE — Clinical Social Work Placement (Addendum)
Clinical Social Work Department CLINICAL SOCIAL WORK PLACEMENT NOTE 05/24/2014  Patient:  Tracy Reeves, Tracy Reeves  Account Number:  [id number] Admit date:  05/20/2014  Clinical Social Worker:  Barbette Or, LCSW  Date/time:  05/24/2014 11:30 AM  Clinical Social Work is seeking post-discharge placement for this patient at the following level of care:   [place]   (*CSW will update this form in Epic as items are completed)   05/24/2014  Patient/family provided with [place] Department of Clinical Social Work's list of facilities offering this level of care within the geographic area requested by the patient (or if unable, by the patient's family).  05/24/2014  Patient/family informed of their freedom to choose among providers that offer the needed level of care, that participate in Medicare, Medicaid or managed care program needed by the patient, have an available bed and are willing to accept the patient.  05/24/2014  Patient/family informed of [facility]' ownership interest in [facility], as well as of the fact that they are under no obligation to receive care at this facility.  PASARR submitted to EDS on 05/24/2014 PASARR number received on 05/24/2014  FL2 transmitted to all facilities in geographic area requested by pt/family on  05/24/2014 FL2 transmitted to all facilities within larger geographic area on   Patient informed that his/her managed care company has contracts with or will negotiate with  certain facilities, including the following:     Patient/family informed of bed offers received: 05/25/2014  Patient chooses bed at  [facility] Physician recommends and patient chooses bed at    Patient to be transferred to [facility] on  05/25/2014   Patient to be transferred to facility by [facility] Conversion Lucianne Lei Patient and family notified of transfer on 05/25/2014 Name of family member notified:  Geni Bers - spoke with patient daughter over  the phone (packet provided by RN)  The following physician request were entered in Epic:   Additional Comments:

## 2014-05-24 NOTE — Progress Notes (Addendum)
UR completed.  Awaiting SNF.  Sandi Mariscal, RN BSN [place] CCM Trauma/Neuro ICU Case Manager [phone]

## 2014-05-24 NOTE — Progress Notes (Signed)
Rehab admissions - I met with patient this am.  She continues to be confused.  She lived alone PTA.  Daughter does not feel that she can provide care after a potential inpatient rehab stay.  Feel that SNF placement is needed when medically ready for discharge.  Call me for questions.  #[phone]

## 2014-05-25 ENCOUNTER — Encounter (HOSPITAL_COMMUNITY): Payer: Self-pay | Admitting: Orthopedic Surgery

## 2014-05-25 DIAGNOSIS — I10 Essential (primary) hypertension: Secondary | ICD-10-CM | POA: Diagnosis present

## 2014-05-25 DIAGNOSIS — E039 Hypothyroidism, unspecified: Secondary | ICD-10-CM | POA: Diagnosis present

## 2014-05-25 MED ORDER — TRAMADOL HCL 50 MG PO TABS: 50.0000 mg | ORAL_TABLET | Freq: Four times a day (QID) | ORAL | Status: DC | PRN

## 2014-05-25 MED ORDER — LORAZEPAM 0.5 MG PO TABS: 0.5000 mg | ORAL_TABLET | Freq: Three times a day (TID) | ORAL | Status: DC | PRN

## 2014-05-25 NOTE — Progress Notes (Signed)
Patient with d/c order to go home. Per report, patient is to be d/c to SNF? Trauma SW called and left message for further instruction.   Ave Filter, RN

## 2014-05-25 NOTE — Discharge Summary (Signed)
Physician Discharge Summary  Patient ID: Tracy Reeves MRN: [id number] DOB/AGE: June 22, 1922 81 y.o.  Admit date: 05/20/2014 Discharge date: 05/25/2014  Discharge Diagnoses Patient Active Problem List   Diagnosis Date Noted  . Hypertension   . Thyroid disease   . Fall 05/23/2014  . Basilar skull fracture 05/23/2014  . TBI (traumatic brain injury) 05/20/2014    Consultants Dr. Consuella Lose for neurosurgery  Dr. Theodoro Kos for plastic surgery  Dr. Alysia Penna for PM&R   Procedures None   HPI: [name] was found down at the bottom of 3 steps in her car port. She was unresponsive when found. EMS was called and they transported her as a level one trauma. On arrival, she was arousable and answered questions. She was unable to contribute much to her past medical or past surgical history. Her workup included CT scans of her head, face, and cervical spine and showed the above-mentioned injuries. Neurosurgery and plastic surgery were consulted and she was admitted to the trauma service.   Hospital Course: Neurosurgery recommended non-operative treatment with close observation in the ICU and a repeat head CT the next day. This showed extension of her intercerebral contusions but no mass effect and she was stable neurologically. She had some confusion and a fairly dense expressive aphasia. These improved over the course of her hospital stay. Plastic surgery also recommended non-operative treatment of her facial fractures. She was mobilized with the traumatic brain injury therapy team who recommended inpatient rehabilitation. They were consulted but felt skilled nursing facility placement was a better option for the patient. A bed was located and she was discharged there in improved condition.     Medication List    TAKE these medications        amLODipine 5 MG tablet  Commonly known as:  NORVASC  Take 5 mg by mouth daily.     levothyroxine 25 MCG tablet  Commonly known as:   SYNTHROID, LEVOTHROID  Take 25 mcg by mouth daily before breakfast.     LORazepam 0.5 MG tablet  Commonly known as:  ATIVAN  Take 1 tablet (0.5 mg total) by mouth 3 (three) times daily as needed for anxiety.     losartan 50 MG tablet  Commonly known as:  COZAAR  Take 50 mg by mouth daily.     nystatin-triamcinolone cream  Commonly known as:  MYCOLOG II  Apply 1 application topically 3 (three) times daily as needed (vaginal burning).     PARoxetine 10 MG tablet  Commonly known as:  PAXIL  Take 10 mg by mouth at bedtime.     traMADol 50 MG tablet  Commonly known as:  ULTRAM  Take 1 tablet (50 mg total) by mouth every 6 (six) hours as needed (pain).            Follow-up Information    Follow up with [facility], Bradley Ferris, MD.   Specialty:  Neurosurgery   Contact information:   [address] [place] [address] [phone]       Follow up with [facility], DO.   Specialty:  Plastic Surgery   Contact information:   [place] [address] [phone]       Follow up with [facility].   Why:  As needed   Contact information:   [address] [place] [address] [phone]       Signed: Lisette Abu, PA-C Pager: [phone] General Trauma PA Pager:  [phone] 05/25/2014, 8:28 AM

## 2014-05-25 NOTE — Progress Notes (Signed)
Patient ID: Tracy Reeves, female   DOB: 10/26/1922, 81 y.o.   MRN: [id number]   LOS: 5 days   Subjective: C/o dizziness, word finding seems better.   Objective: Vital signs in last 24 hours: Temp:  [98.1 F (36.7 C)-99.1 F (37.3 C)] 98.1 F (36.7 C) (03/04 0559) Pulse Rate:  [69-84] 84 (03/04 0559) Resp:  [15-20] 18 (03/04 0559) BP: (136-167)/(60-80) 148/70 mmHg (03/04 0559) SpO2:  [96 %-100 %] 97 % (03/04 0559) Last BM Date: 05/24/14   Physical Exam General appearance: alert and no distress Resp: clear to auscultation bilaterally Cardio: regular rate and rhythm   Assessment/Plan: Fall TBI/right frontal ICC/L parietal ICC/L temporal ICC at middle cranial fossa - TBI team therapies, expressive aphasia improved somewhat R max sinus and skull base FXs - per Dr. Migdalia Dk Skull base FX with associated hematoma at R orbital apex - eye pressures were OK so nothing needed per opthalmology R forehead lac and scalp hematoma Dispo - To SNF today    Lisette Abu, PA-C Pager: [phone] General Trauma PA Pager: [phone]  05/25/2014

## 2014-05-25 NOTE — Clinical Social Work Note (Signed)
Clinical Social Worker facilitated patient discharge including contacting patient family and facility to confirm patient discharge plans.  Clinical information faxed to facility and family agreeable with plan.  CSW arranged transport via family conversion Lucianne Lei to [facility].  RN to call report prior to discharge.  Clinical Social Worker will sign off for now as social work intervention is no longer needed. Please consult [place] again if new need arises.  Barbette Or, [place]

## 2014-05-25 NOTE — Progress Notes (Signed)
Physical Therapy Treatment Patient Details Name: Tracy Reeves MRN: [id number] DOB: 02-08-1923 Today's Date: 05/25/2014    History of Present Illness 81 year old female with unknown past medical history HTN, shingles presents after unwitnessed fall and loss of consciousness , TBI/right frontal ICC/L parietal ICC/L temporal ICC at middle cranial fossa, R max sinus and skull base FXs,Skull base FX with associated hematoma at R orbital apex, R forehead lac and scalp hematoma.      PT Comments    Pt self-limits activity today and trying to walk straight into her bed.  Pt needs cues for safety, impulsivity, and attending to task.  Pt only oriented to self, and does not retain orientation info once told to her.  Pt perseverates on rubbing hands under the water during hand hygiene, but refused to use soap.  At this time pt presents as a Rancho V confused and non-agitated.  Noted in Rehab Admission RN note that pt not a candidate for CIR and will need SNF for further rehab.  Will continue to follow.    Follow Up Recommendations  SNF     Equipment Recommendations  None recommended by PT    Recommendations for Other Services       Precautions / Restrictions Precautions Precautions: Fall Restrictions Weight Bearing Restrictions: No    Mobility  Bed Mobility Overal bed mobility: Needs Assistance Bed Mobility: Supine to Sit;Sit to Supine     Supine to sit: Min assist Sit to supine: Supervision   General bed mobility comments: pt needs minimal A bringing trunk up to sitting.    Transfers Overall transfer level: Needs assistance Equipment used: Rolling walker (2 wheeled) Transfers: Sit to/from Stand Sit to Stand: Min assist         General transfer comment: cues for safe technique as pt impulsive.    Ambulation/Gait Ambulation/Gait assistance: Min assist Ambulation Distance (Feet): 20 Feet Assistive device: Rolling walker (2 wheeled) Gait Pattern/deviations: Step-through  pattern;Decreased stride length;Trunk flexed     General Gait Details: pt with poor awareness of safety and often running herself and RW into furniture and door frame.  pt needs frequent A for management of RW.     Stairs            Wheelchair Mobility    Modified Rankin (Stroke Patients Only)       Balance Overall balance assessment: Needs assistance Sitting-balance support: No upper extremity supported;Feet supported Sitting balance-Leahy Scale: Fair     Standing balance support: Bilateral upper extremity supported;During functional activity Standing balance-Leahy Scale: Poor Standing balance comment: During hand hygiene pt needed to lean on sink for support.  cues for performing hand hygiene and pt perseverates on just holding her hands under the water without any soap.                      Cognition Arousal/Alertness: Awake/alert Behavior During Therapy: Impulsive Overall Cognitive Status: Impaired/Different from baseline Area of Impairment: Orientation;Attention;Memory;Following commands;Safety/judgement;Awareness;Problem solving;Rancho level Orientation Level: Disoriented to;Place;Time;Situation Current Attention Level: Sustained Memory: Decreased short-term memory;Decreased recall of precautions Following Commands: Follows one step commands with increased time;Follows one step commands inconsistently Safety/Judgement: Decreased awareness of safety;Decreased awareness of deficits Awareness: Intellectual Problem Solving: Slow processing;Decreased initiation;Difficulty sequencing;Requires verbal cues;Requires tactile cues General Comments: pt follows some directions, but often needs additional cueing.  pt only oriented to self and no carry over once oriented.      Exercises      General Comments  Pertinent Vitals/Pain Pain Assessment: No/denies pain    Home Living                      Prior Function            PT Goals (current  goals can now be found in the care plan section) Acute Rehab PT Goals Patient Stated Goal: none stated. PT Goal Formulation: Patient unable to participate in goal setting Time For Goal Achievement: 06/05/14 Potential to Achieve Goals: Fair Progress towards PT goals: Progressing toward goals    Frequency  Min 3X/week    PT Plan Discharge plan needs to be updated    Co-evaluation             End of Session Equipment Utilized During Treatment: Gait belt Activity Tolerance: Patient tolerated treatment well Patient left: in bed;with call bell/phone within reach;with bed alarm set     Time: 6389-3734 PT Time Calculation (min) (ACUTE ONLY): 24 min  Charges:  $Gait Training: 8-22 mins $Therapeutic Activity: 8-22 mins                    G CodesCatarina Reeves, [place] 05/25/2014, 10:33 AM

## 2014-05-25 NOTE — Progress Notes (Signed)
Pt being discharged to [facility] facility via her daughter. Vitals stable at this time and pt in no respiratory distress. No c/o pain. Education and care plans completed. IV removed. Pt tolerated well. Paperwork sent with daughter. Leanne Chang, RN

## 2014-06-18 ENCOUNTER — Encounter (HOSPITAL_BASED_OUTPATIENT_CLINIC_OR_DEPARTMENT_OTHER): Payer: Medicare Other | Attending: Plastic Surgery

## 2014-06-19 ENCOUNTER — Encounter: Payer: Self-pay | Admitting: Adult Health

## 2014-06-19 ENCOUNTER — Non-Acute Institutional Stay (SKILLED_NURSING_FACILITY): Payer: Medicare Other | Admitting: Adult Health

## 2014-06-19 DIAGNOSIS — I10 Essential (primary) hypertension: Secondary | ICD-10-CM

## 2014-06-19 DIAGNOSIS — S0210XS Unspecified fracture of base of skull, sequela: Secondary | ICD-10-CM

## 2014-06-19 DIAGNOSIS — S069X4D Unspecified intracranial injury with loss of consciousness of 6 hours to 24 hours, subsequent encounter: Secondary | ICD-10-CM | POA: Diagnosis not present

## 2014-06-19 DIAGNOSIS — F0150 Vascular dementia without behavioral disturbance: Secondary | ICD-10-CM | POA: Diagnosis not present

## 2014-06-19 DIAGNOSIS — E079 Disorder of thyroid, unspecified: Secondary | ICD-10-CM

## 2014-06-19 NOTE — Progress Notes (Signed)
Patient ID: Tracy Reeves, female   DOB: November 22, 1922, 81 y.o.   MRN: [id number]  [facility]     No Known Allergies     Chief Complaint  Patient presents with  . Discharge Note    HPI:  She is being discharged to assisted living. Her medical history per epic has ben somewhat vague. I have spoken with her daughter at great length and there has been concern for a couple of years that she has dementia present. She has been experiencing periods of confusion; worries about taxes; bills being paid and has expressed concerned about her bank statements with people "stealing" from her with the automatic payments present. The TBI has med this worse.  She will need home health for pt/o/st to improve upon gait; balance; adl retraining and cognition. She will need a standard wheelchair in order to maintain her current level of independence with her adl's and will need a rolling walker to assist with her transfers. She will need her prescriptions written she will follow up with the pcp from the facility and will need neurological consult to work up her dementia in light a TBI.     Past Medical History  Diagnosis Date  . Hypertension   . Shingles   . Thyroid disease     No past surgical history on file.  VITAL SIGNS BP 140/83 mmHg  Pulse 63  Ht 5\' 1"  (1.549 m)  Wt 121 lb (54.885 kg)  BMI 22.87 kg/m2  SpO2 94%   Outpatient Encounter Prescriptions as of 06/19/2014  Medication Sig  . amLODipine (NORVASC) 5 MG tablet Take 5 mg by mouth daily.  Marland Kitchen levothyroxine (SYNTHROID, LEVOTHROID) 25 MCG tablet Take 25 mcg by mouth daily before breakfast.  . LORazepam (ATIVAN) 0.5 MG tablet Take 1 tablet (0.5 mg total) by mouth 3 (three) times daily as needed for anxiety. (Patient taking differently: Take 0.5 mg by mouth every 6 (six) hours as needed for anxiety. )  . losartan (COZAAR) 50 MG tablet Take 50 mg by mouth daily.  Marland Kitchen nystatin-triamcinolone (MYCOLOG II) cream Apply 1 application topically 3  (three) times daily as needed (vaginal burning).  Marland Kitchen PARoxetine (PAXIL) 10 MG tablet Take 10 mg by mouth at bedtime.  . traMADol (ULTRAM) 50 MG tablet Take 1 tablet (50 mg total) by mouth every 6 (six) hours as needed (pain).     SIGNIFICANT DIAGNOSTIC EXAMS  05-20-14: pelvic x-ray: No acute bony abnormality  05-20-14: chest x-ray: Elevated right hemidiaphragm.  No acute findings  05-20-14: ct of head and cervical spine: Hematoma in the right orbital apex with associated proptosis. Fractures involving the right zygomatic arch, right frontal bone, and extending through the greater wing of the sphenoid. Associated fractures involving the right maxillary sinus and bilateral sphenoid sinuses. Extracranial hematoma overlying the right frontal bone. Underlying parenchymal contusion in the right frontal lobe, left parietal lobe, and left temporal lobe/middle cranial fossa. Additional subarachnoid hemorrhage in the left frontal lobe. No evidence of acute traumatic injury to the cervical spine.  05-20-14: ct of maxillofacial: 1. Minimally displaced tetrapod fracture of the right zygomaticomaxillary complex, described in further detail above, with blood filling the right maxillary sinus and sphenoid sinus, and partially filling the nasal passages. 2. Fracture line extending across the base of the skull, more prominent on the right, though also extending to the left, passing through the sphenoid sinus on both sides. 3. Right-sided proptosis noted. This reflects underlying intraorbital hematoma and air tracking along the posterior aspect of  the orbit, measuring approximately 1.9 x 0.5 x 0.8 cm, with poor characterization of the adjacent posterior aspect of the inferior rectus muscle, though the anterior aspect of the muscle appears intact. 4. Intraparenchymal hemorrhage at the left temporal lobe is better defined, and appears to have increased mildly in size, measuring 1.9 x 1.7 cm. 5. Mild left-sided subdural  blood noted along the tentorium cerebelli. 6. Parenchymal contusion again noted along the left parietal lobe. 7. Mild soft tissue swelling about the right orbit, and overlying the right maxilla, with minimal associated soft tissue air overlying the right maxilla.   LABS REVIEWED:   05-20-14: wbc 8.5; hgb 13.9; hct 40.0; mcv 91.5; plt 264; glucose 120; bun 18; creat 1.25; k+4.0;  na++ 137; liver normal albumin 3.6 05-23-14: wbc 11.3; hgb 13.8; hct 41.0; mcv 89.9; plt 204; glucose 99; bun 14; creat 0.69; k+2.9; na++136     Review of Systems  Constitutional: Negative for malaise/fatigue.  Respiratory: Negative for cough and shortness of breath.   Cardiovascular: Negative for chest pain and leg swelling.  Gastrointestinal: Negative for heartburn, abdominal pain and constipation.  Musculoskeletal: Negative for myalgias and joint pain.  Skin:       Has bruising on face   Psychiatric/Behavioral: The patient is not nervous/anxious.      Physical Exam  Constitutional: No distress.  Thin   Neck: Neck supple. No JVD present. No thyromegaly present.  Cardiovascular: Normal rate, regular rhythm and intact distal pulses.   Respiratory: Effort normal. No respiratory distress.  GI: Soft. Bowel sounds are normal. She exhibits no distension. There is no tenderness.  Musculoskeletal: Normal range of motion. She exhibits no edema.  Skin: Skin is dry. She is not diaphoretic.  Has bruising on right eye        ASSESSMENT/ PLAN:  Will discharge her to assisted living with home health for pt/ot/st. She will need a standard 18 inch wheelchair; rolling walker. Her prescriptions have been written for a 30 day supply of her medications with #30 ultram 5 mg tabs; #30 ativan 0.5 mg tabs; with her abt and florastor. She will follow up with the pcp at the assisted living. Has a neurology  Consult with [facility] neurology on 07-17-14.    Time spent with patient 50 minutes.    Ok Edwards NP [facility]  [facility]  Contact [phone] Monday through Friday 8am- 5pm  After hours call [phone]

## 2014-06-20 ENCOUNTER — Encounter: Payer: Self-pay | Admitting: Internal Medicine

## 2014-06-20 ENCOUNTER — Non-Acute Institutional Stay (SKILLED_NURSING_FACILITY): Payer: Medicare Other | Admitting: Internal Medicine

## 2014-06-20 DIAGNOSIS — I10 Essential (primary) hypertension: Secondary | ICD-10-CM | POA: Diagnosis not present

## 2014-06-20 DIAGNOSIS — S0210XS Unspecified fracture of base of skull, sequela: Secondary | ICD-10-CM

## 2014-06-20 DIAGNOSIS — E038 Other specified hypothyroidism: Secondary | ICD-10-CM | POA: Diagnosis not present

## 2014-06-20 DIAGNOSIS — S069X4D Unspecified intracranial injury with loss of consciousness of 6 hours to 24 hours, subsequent encounter: Secondary | ICD-10-CM | POA: Diagnosis not present

## 2014-06-20 DIAGNOSIS — I6200 Nontraumatic subdural hemorrhage, unspecified: Secondary | ICD-10-CM

## 2014-06-20 DIAGNOSIS — S065X9A Traumatic subdural hemorrhage with loss of consciousness of unspecified duration, initial encounter: Secondary | ICD-10-CM

## 2014-06-20 DIAGNOSIS — F329 Major depressive disorder, single episode, unspecified: Secondary | ICD-10-CM

## 2014-06-20 DIAGNOSIS — E034 Atrophy of thyroid (acquired): Secondary | ICD-10-CM

## 2014-06-20 DIAGNOSIS — S065XAA Traumatic subdural hemorrhage with loss of consciousness status unknown, initial encounter: Secondary | ICD-10-CM

## 2014-06-20 DIAGNOSIS — F32A Depression, unspecified: Secondary | ICD-10-CM

## 2014-06-20 NOTE — Progress Notes (Signed)
Patient ID: Tracy Reeves, female   DOB: 08-Feb-1923, 81 y.o.   MRN: [id number]    History and Physical  Facility  [facility]    Place of Service:   SNF   No Known Allergies   CODE STATUS: Full code; MOST form on chart- no FT  Chief Complaint  Patient presents with  . New Admit To SNF    HPI:  81 yo female seen today as a new admission into SNF following hospitalization for basilar skull fx dx 05/23/14 after fall causing TBI 05/20/14. She sustained a subdural hematoma. No sx performed. She is confused today. No nursing issues. She has been eating and sleeping well.  BP stable on  Amlodipine and losartan.  Thyroid controlled on levothyroxine.  Depression stable on paroxetine and prn lorazepam.  Past Medical History  Diagnosis Date  . Hypertension   . Shingles   . Thyroid disease    No past surgical history on file. History   Social History  . Marital Status: Unknown    Spouse Name: N/A  . Number of Children: N/A  . Years of Education: N/A   Social History Main Topics  . Smoking status: Never Smoker   . Smokeless tobacco: Not on file  . Alcohol Use: Not on file  . Drug Use: Not on file  . Sexual Activity: Not on file   Other Topics Concern  . None   Social History Narrative   No family history on file.  Medications: Patient's Medications  New Prescriptions   No medications on file  Previous Medications   AMLODIPINE (NORVASC) 5 MG TABLET    Take 5 mg by mouth daily.   LEVOTHYROXINE (SYNTHROID, LEVOTHROID) 25 MCG TABLET    Take 25 mcg by mouth daily before breakfast.   LORAZEPAM (ATIVAN) 0.5 MG TABLET    Take 1 tablet (0.5 mg total) by mouth 3 (three) times daily as needed for anxiety.   LOSARTAN (COZAAR) 50 MG TABLET    Take 50 mg by mouth daily.   NYSTATIN-TRIAMCINOLONE (MYCOLOG II) CREAM    Apply 1 application topically 3 (three) times daily as needed (vaginal burning).   PAROXETINE (PAXIL) 10 MG TABLET    Take 10 mg by mouth at bedtime.   TRAMADOL (ULTRAM) 50 MG TABLET    Take 1 tablet (50 mg total) by mouth every 6 (six) hours as needed (pain).  Modified Medications   No medications on file  Discontinued Medications   No medications on file     Review of Systems  Unable to perform ROS: Dementia    Filed Vitals:   06/20/14 0012  BP: 132/67  Pulse: 65  Temp: 98 F (36.7 C)   There is no weight on file to calculate BMI.  Physical Exam  Constitutional: She appears well-developed and well-nourished. No distress.  Lying in bed  HENT:  Head: Normocephalic. Head is with contusion. Hair is abnormal (hair is bloody).    Mouth/Throat: Oropharynx is clear and moist. Mucous membranes are dry. No oropharyngeal exudate.  Eyes: Pupils are equal, round, and reactive to light. No scleral icterus.    Neck: Neck supple. No tracheal deviation present. No thyromegaly present.  Cardiovascular: Normal rate, regular rhythm and intact distal pulses.  Exam reveals no gallop and no friction rub.   Murmur (1/6 SEM) heard. No LE edema b/l. no calf TTP. No carotid bruit b/l  Pulmonary/Chest: Effort normal and breath sounds normal. No stridor. No respiratory distress. She has no wheezes.  She has no rales.  Abdominal: Soft. Bowel sounds are normal. She exhibits no distension and no mass. There is no tenderness. There is no rebound and no guarding.  Lymphadenopathy:    She has no cervical adenopathy.  Neurological: She is alert. She has normal reflexes.  Skin: Skin is warm and dry. Bruising and laceration (right frontal) noted. No rash noted.  Contusions on right lateral thigh, right face  Psychiatric: Her behavior is normal. Her mood appears anxious. Thought content is not paranoid and not delusional.     Labs reviewed: Admission on 05/20/2014, Discharged on 05/25/2014  Component Date Value Ref Range Status  . ABO/RH(D) 05/20/2014 O POS   Final  . Antibody Screen 05/20/2014 NEG   Final  . Sample Expiration 05/20/2014 05/23/2014    Final  . Unit Number 05/20/2014 [id number]   Final  . Blood Component Type 05/20/2014 RED CELLS,LR   Final  . Unit division 05/20/2014 00   Final  . Status of Unit 05/20/2014 REL FROM [facility]   Final  . Unit tag comment 05/20/2014 VERBAL ORDERS PER DR [id number]   Final  . Transfusion Status 05/20/2014 OK TO TRANSFUSE   Final  . Crossmatch Result 05/20/2014 NOT NEEDED   Final  . Unit Number 05/20/2014 [id number]   Final  . Blood Component Type 05/20/2014 RED CELLS,LR   Final  . Unit division 05/20/2014 00   Final  . Status of Unit 05/20/2014 REL FROM [facility]   Final  . Unit tag comment 05/20/2014 VERBAL ORDERS PER DR [id number]   Final  . Transfusion Status 05/20/2014 OK TO TRANSFUSE   Final  . Crossmatch Result 05/20/2014 NOT NEEDED   Final  . Unit Number 05/20/2014 [id number]   Final  . Blood Component Type 05/20/2014 THW PLS APHR   Final  . Unit division 05/20/2014 00   Final  . Status of Unit 05/20/2014 REL FROM [facility]   Final  . Transfusion Status 05/20/2014 OK TO TRANSFUSE   Final  . Unit Number 05/20/2014 [id number]   Final  . Blood Component Type 05/20/2014 THAWED PLASMA   Final  . Unit division 05/20/2014 00   Final  . Status of Unit 05/20/2014 REL FROM [facility]   Final  . Transfusion Status 05/20/2014 OK TO TRANSFUSE   Final  . CDS serology specimen 05/20/2014 SPECIMEN WILL BE HELD FOR 14 DAYS IF TESTING IS REQUIRED   Final  . Sodium 05/20/2014 137  135 - 145 mmol/L Final  . Potassium 05/20/2014 4.0  3.5 - 5.1 mmol/L Final  . Chloride 05/20/2014 102  96 - 112 mmol/L Final  . CO2 05/20/2014 30  19 - 32 mmol/L Final  . Glucose, Bld 05/20/2014 120* 70 - 99 mg/dL Final  . BUN 05/20/2014 18  6 - 23 mg/dL Final  . Creatinine, Ser 05/20/2014 1.25* 0.50 - 1.10 mg/dL Final  . Calcium 05/20/2014 9.1  8.4 - 10.5 mg/dL Final  . Total Protein 05/20/2014 6.3  6.0 - 8.3 g/dL Final  . Albumin 05/20/2014 3.6  3.5 - 5.2 g/dL Final  . AST 05/20/2014 46* 0 - 37 U/L Final  . ALT 05/20/2014 28   0 - 35 U/L Final  . Alkaline Phosphatase 05/20/2014 46  39 - 117 U/L Final  . Total Bilirubin 05/20/2014 0.3  0.3 - 1.2 mg/dL Final  . GFR calc non Af Amer 05/20/2014 37* >90 mL/min Final  . GFR calc Af Amer 05/20/2014 43* >90 mL/min Final   Comment: (NOTE) The eGFR  has been calculated using the CKD EPI equation. This calculation has not been validated in all clinical situations. eGFR's persistently <90 mL/min signify possible Chronic Kidney Disease.   . Anion gap 05/20/2014 5  5 - 15 Final  . WBC 05/20/2014 8.5  4.0 - 10.5 K/uL Final  . RBC 05/20/2014 4.37  3.87 - 5.11 MIL/uL Final  . Hemoglobin 05/20/2014 13.0  12.0 - 15.0 g/dL Final  . HCT 05/20/2014 40.0  36.0 - 46.0 % Final  . MCV 05/20/2014 91.5  78.0 - 100.0 fL Final  . MCH 05/20/2014 29.7  26.0 - 34.0 pg Final  . MCHC 05/20/2014 32.5  30.0 - 36.0 g/dL Final  . RDW 05/20/2014 14.0  11.5 - 15.5 % Final  . Platelets 05/20/2014 264  150 - 400 K/uL Final  . Alcohol, Ethyl (B) 05/20/2014 <5  0 - 9 mg/dL Final   Comment:        LOWEST DETECTABLE LIMIT FOR SERUM ALCOHOL IS 11 mg/dL FOR MEDICAL PURPOSES ONLY   . Prothrombin Time 05/20/2014 13.9  11.6 - 15.2 seconds Final  . INR 05/20/2014 1.06  0.00 - 1.49 Final  . Sodium 05/20/2014 139  135 - 145 mmol/L Final  . Potassium 05/20/2014 4.0  3.5 - 5.1 mmol/L Final  . Chloride 05/20/2014 100  96 - 112 mmol/L Final  . BUN 05/20/2014 21  6 - 23 mg/dL Final  . Creatinine, Ser 05/20/2014 1.20* 0.50 - 1.10 mg/dL Final  . Glucose, Bld 05/20/2014 119* 70 - 99 mg/dL Final  . Calcium, Ion 05/20/2014 1.17  1.13 - 1.30 mmol/L Final  . TCO2 05/20/2014 24  0 - 100 mmol/L Final  . Hemoglobin 05/20/2014 14.6  12.0 - 15.0 g/dL Final  . HCT 05/20/2014 43.0  36.0 - 46.0 % Final  . Lactic Acid, Venous 05/20/2014 1.52  0.5 - 2.0 mmol/L Final  . ABO/RH(D) 05/20/2014 O POS   Final  . MRSA by PCR 05/20/2014 NEGATIVE  NEGATIVE Final   Comment:        The GeneXpert MRSA Assay (FDA approved for NASAL  specimens only), is one component of a comprehensive MRSA colonization surveillance program. It is not intended to diagnose MRSA infection nor to guide or monitor treatment for MRSA infections.   . WBC 05/21/2014 14.2* 4.0 - 10.5 K/uL Final  . RBC 05/21/2014 4.34  3.87 - 5.11 MIL/uL Final  . Hemoglobin 05/21/2014 13.1  12.0 - 15.0 g/dL Final  . HCT 05/21/2014 39.9  36.0 - 46.0 % Final  . MCV 05/21/2014 91.9  78.0 - 100.0 fL Final  . MCH 05/21/2014 30.2  26.0 - 34.0 pg Final  . MCHC 05/21/2014 32.8  30.0 - 36.0 g/dL Final  . RDW 05/21/2014 13.9  11.5 - 15.5 % Final  . Platelets 05/21/2014 212  150 - 400 K/uL Final  . Prothrombin Time 05/21/2014 14.4  11.6 - 15.2 seconds Final  . INR 05/21/2014 1.10  0.00 - 1.49 Final  . Sodium 05/21/2014 134* 135 - 145 mmol/L Final  . Potassium 05/21/2014 4.1  3.5 - 5.1 mmol/L Final  . Chloride 05/21/2014 101  96 - 112 mmol/L Final  . CO2 05/21/2014 21  19 - 32 mmol/L Final  . Glucose, Bld 05/21/2014 205* 70 - 99 mg/dL Final  . BUN 05/21/2014 13  6 - 23 mg/dL Final  . Creatinine, Ser 05/21/2014 0.98  0.50 - 1.10 mg/dL Final  . Calcium 05/21/2014 8.5  8.4 - 10.5 mg/dL Final  . GFR  calc non Af Amer 05/21/2014 50* >90 mL/min Final  . GFR calc Af Amer 05/21/2014 58* >90 mL/min Final   Comment: (NOTE) The eGFR has been calculated using the CKD EPI equation. This calculation has not been validated in all clinical situations. eGFR's persistently <90 mL/min signify possible Chronic Kidney Disease.   . Anion gap 05/21/2014 12  5 - 15 Final  . Blood product order confirm 05/22/2014 MD AUTHORIZATION REQUESTED   Final  . WBC 05/23/2014 11.3* 4.0 - 10.5 K/uL Final  . RBC 05/23/2014 4.56  3.87 - 5.11 MIL/uL Final  . Hemoglobin 05/23/2014 13.8  12.0 - 15.0 g/dL Final  . HCT 05/23/2014 41.0  36.0 - 46.0 % Final  . MCV 05/23/2014 89.9  78.0 - 100.0 fL Final  . MCH 05/23/2014 30.3  26.0 - 34.0 pg Final  . MCHC 05/23/2014 33.7  30.0 - 36.0 g/dL Final  . RDW  05/23/2014 13.5  11.5 - 15.5 % Final  . Platelets 05/23/2014 204  150 - 400 K/uL Final  . Sodium 05/23/2014 136  135 - 145 mmol/L Final  . Potassium 05/23/2014 2.9* 3.5 - 5.1 mmol/L Final   DELTA CHECK NOTED  . Chloride 05/23/2014 102  96 - 112 mmol/L Final  . CO2 05/23/2014 24  19 - 32 mmol/L Final  . Glucose, Bld 05/23/2014 99  70 - 99 mg/dL Final  . BUN 05/23/2014 14  6 - 23 mg/dL Final  . Creatinine, Ser 05/23/2014 0.69  0.50 - 1.10 mg/dL Final  . Calcium 05/23/2014 8.6  8.4 - 10.5 mg/dL Final  . GFR calc non Af Amer 05/23/2014 75* >90 mL/min Final  . GFR calc Af Amer 05/23/2014 87* >90 mL/min Final   Comment: (NOTE) The eGFR has been calculated using the CKD EPI equation. This calculation has not been validated in all clinical situations. eGFR's persistently <90 mL/min signify possible Chronic Kidney Disease.   . Anion gap 05/23/2014 10  5 - 15 Final   Hospital records reviewed - she was dx with basilar skull fx due to TBI after fall. No surgery performed as she had no mass effect on imaging. She was seen by neurosx and plastic sx as well as PM&R  Assessment/Plan   ICD-9-CM ICD-10-CM   1. TBI (traumatic brain injury), with loss of consciousness of 6 hours to 24 hours, subsequent encounter V58.89 S06.9X4D   2. Basilar skull fracture, sequela  905.0 S02.10XS   3. Subdural hematoma - no mass effect 432.1 I62.00   4. Depression - stable on paxil 311 F32.9   5. Essential hypertension - controlled on amlodipine and losartan 401.9 I10   6. Hypothyroidism due to acquired atrophy of thyroid - stable on levothyroxine 244.8 E03.8    246.8 E03.4     --PT/OT as indicated  --f/u with neurosx Dr Kathyrn Sheriff and plastic sx Dr Migdalia Dk as scheduled  --continue current medications as ordered  --GOAL: short term rehab and d/c home when medically appropriate. Communicated with pt and nursing.   Chatara Lucente S. Perlie Gold  [facility] [address] [place], [place] [address] [phone] Office (Wednesdays and Fridays 8 AM - 5 PM) [phone] Cell (Monday-Friday 8 AM - 5 PM)

## 2014-06-25 DIAGNOSIS — R41841 Cognitive communication deficit: Secondary | ICD-10-CM | POA: Diagnosis not present

## 2014-06-25 DIAGNOSIS — Z9183 Wandering in diseases classified elsewhere: Secondary | ICD-10-CM | POA: Diagnosis not present

## 2014-06-25 DIAGNOSIS — R4701 Aphasia: Secondary | ICD-10-CM | POA: Diagnosis not present

## 2014-06-25 DIAGNOSIS — S069X0D Unspecified intracranial injury without loss of consciousness, subsequent encounter: Secondary | ICD-10-CM | POA: Diagnosis not present

## 2014-06-25 DIAGNOSIS — F0151 Vascular dementia with behavioral disturbance: Secondary | ICD-10-CM | POA: Diagnosis not present

## 2014-06-25 DIAGNOSIS — S028XXD Fractures of other specified skull and facial bones, subsequent encounter for fracture with routine healing: Secondary | ICD-10-CM | POA: Diagnosis not present

## 2014-07-01 DIAGNOSIS — S065XAA Traumatic subdural hemorrhage with loss of consciousness status unknown, initial encounter: Secondary | ICD-10-CM | POA: Insufficient documentation

## 2014-07-01 DIAGNOSIS — F329 Major depressive disorder, single episode, unspecified: Secondary | ICD-10-CM | POA: Insufficient documentation

## 2014-07-01 DIAGNOSIS — I10 Essential (primary) hypertension: Secondary | ICD-10-CM | POA: Insufficient documentation

## 2014-07-01 DIAGNOSIS — S065X9A Traumatic subdural hemorrhage with loss of consciousness of unspecified duration, initial encounter: Secondary | ICD-10-CM | POA: Insufficient documentation

## 2014-07-01 DIAGNOSIS — F32A Depression, unspecified: Secondary | ICD-10-CM | POA: Insufficient documentation

## 2014-07-01 DIAGNOSIS — E034 Atrophy of thyroid (acquired): Secondary | ICD-10-CM | POA: Insufficient documentation

## 2014-07-10 NOTE — Op Note (Signed)
PATIENT NAME:  Tracy Reeves, Tracy Reeves MR#:  [id number] DATE OF BIRTH:  [birth year]  DATE OF PROCEDURE:  11/16/2011  PREOPERATIVE DIAGNOSIS: Cataract, right eye.   POSTOPERATIVE DIAGNOSIS: Cataract, right eye.   PROCEDURE PERFORMED: Extracapsular cataract extraction using phacoemulsification with placement of Alcon [id number] 25.0-diopter posterior chamber lens with 1.5 diopters of cylinder, serial number [id number].   SURGEON: Loura Back. Shaft Corigliano, M.D.   ANESTHESIA: 4% lidocaine and 0.75% Marcaine in a 50-50 mixture with 10 units/mL of Hylenex added, given as a peribulbar.   ANESTHESIOLOGIST: Dr. Benjamine Mola.   COMPLICATIONS: None.  ESTIMATED BLOOD LOSS: Less than 1 mL.   DESCRIPTION OF PROCEDURE: The patient was brought to the Operating Room and the eye was anesthetized with topical proparacaine. She was set upright and with fixing at a distant target, the 3 and 9:00 positions were marked using [employer]ico toric marker. She was then placed supine and given IV sedation. She was given a peribulbar block. She was then prepped and draped in the usual fashion. The vertical rectus muscles were imbricated using 5-0 silk suture as bridle sutures. A limbal peritomy was carried out for one clock hour at 9:00. The toric marker was brought to the table and the 34 degree marks were marked. A partial thickness scleral groove was made at the posterior surgical limbus and dissected anteriorly into clear cornea with an Alcon crescent knife. The anterior chamber was entered superonasally through clear cornea with a paracentesis knife and through the lamellar dissection with a 2.6 mm keratome. DisCoVisc was used to replace the aqueous and a continuous tear circular capsulorrhexis was carried out. Hydrodelineation was used to loosen the nucleus and phacoemulsification was carried out in a divide and conquer technique. Ultrasound time was one minute and 44 seconds with an average power of 121.7 with CDE of 37.44. Irrigation-aspiration was  used to remove the residual cortex. The capsular bag was inflated with DisCoVisc and the intraocular lens was inserted in the capsular bag using a [employer]. The lines on the haptics were lined with 34 degrees marks that had been made on the cornea. Irrigation-aspiration was used to remove the residual DisCoVisc. The position of the lens was double checked and found to be in good position. The wound was inflated with balanced salt and Miostat was injected through the paracentesis track to induce miosis and deepen the chamber. The wound was checked for leaks. None were found. The conjunctiva was closed with cautery. The bridle sutures were removed. Two drops of Vigamox were placed in the eye. A shield was placed on the eye. The patient was discharged to the recovery room in good condition.   ____________________________ Loura Back Hedi Barkan, MD sad:ap D: 11/16/2011 13:41:07 ET T: 11/16/2011 13:46:57 ET JOB#: [id number]  cc: Remo Lipps A. Anuar Walgren, MD, <Dictator> Martie Lee MD ELECTRONICALLY SIGNED 11/20/2011 12:40

## 2014-07-17 ENCOUNTER — Ambulatory Visit: Payer: Medicare Other | Admitting: Neurology

## 2014-07-18 ENCOUNTER — Telehealth: Payer: Self-pay | Admitting: Neurology

## 2014-07-18 NOTE — Telephone Encounter (Signed)
Pt referred as inpt for follow up. Pt no showed NP appt sch for 07/17/14 w/ Dr. Tomi Likens. No show letter + policy mailed to pt / Sherri S.

## 2014-07-29 ENCOUNTER — Encounter (HOSPITAL_COMMUNITY): Payer: Self-pay | Admitting: Emergency Medicine

## 2014-07-29 ENCOUNTER — Emergency Department (HOSPITAL_COMMUNITY)
Admission: EM | Admit: 2014-07-29 | Discharge: 2014-07-30 | Disposition: A | Discharge status: Home / Self Care | Payer: Medicare Other | Attending: Emergency Medicine | Admitting: Emergency Medicine

## 2014-07-29 DIAGNOSIS — S0990XA Unspecified injury of head, initial encounter: Secondary | ICD-10-CM | POA: Diagnosis not present

## 2014-07-29 DIAGNOSIS — Y92121 Bathroom in nursing home as the place of occurrence of the external cause: Secondary | ICD-10-CM | POA: Diagnosis not present

## 2014-07-29 DIAGNOSIS — S40021A Contusion of right upper arm, initial encounter: Secondary | ICD-10-CM | POA: Diagnosis not present

## 2014-07-29 DIAGNOSIS — Z79899 Other long term (current) drug therapy: Secondary | ICD-10-CM | POA: Diagnosis not present

## 2014-07-29 DIAGNOSIS — Z8619 Personal history of other infectious and parasitic diseases: Secondary | ICD-10-CM | POA: Diagnosis not present

## 2014-07-29 DIAGNOSIS — I10 Essential (primary) hypertension: Secondary | ICD-10-CM | POA: Diagnosis not present

## 2014-07-29 DIAGNOSIS — Y998 Other external cause status: Secondary | ICD-10-CM | POA: Insufficient documentation

## 2014-07-29 DIAGNOSIS — Y92129 Unspecified place in nursing home as the place of occurrence of the external cause: Secondary | ICD-10-CM

## 2014-07-29 DIAGNOSIS — Y9389 Activity, other specified: Secondary | ICD-10-CM | POA: Diagnosis not present

## 2014-07-29 DIAGNOSIS — G309 Alzheimer's disease, unspecified: Secondary | ICD-10-CM | POA: Diagnosis not present

## 2014-07-29 DIAGNOSIS — F0280 Dementia in other diseases classified elsewhere without behavioral disturbance: Secondary | ICD-10-CM | POA: Insufficient documentation

## 2014-07-29 DIAGNOSIS — W19XXXA Unspecified fall, initial encounter: Secondary | ICD-10-CM

## 2014-07-29 DIAGNOSIS — S51801A Unspecified open wound of right forearm, initial encounter: Secondary | ICD-10-CM | POA: Diagnosis not present

## 2014-07-29 DIAGNOSIS — E079 Disorder of thyroid, unspecified: Secondary | ICD-10-CM | POA: Diagnosis not present

## 2014-07-29 DIAGNOSIS — S59911A Unspecified injury of right forearm, initial encounter: Secondary | ICD-10-CM | POA: Diagnosis present

## 2014-07-29 DIAGNOSIS — W01198A Fall on same level from slipping, tripping and stumbling with subsequent striking against other object, initial encounter: Secondary | ICD-10-CM | POA: Insufficient documentation

## 2014-07-29 HISTORY — DX: Unspecified dementia, unspecified severity, without behavioral disturbance, psychotic disturbance, mood disturbance, and anxiety: F03.90

## 2014-07-29 NOTE — ED Notes (Signed)
Patient presents from [facility] via EMS for fall. Per EMS, facility states pt fell in bathroom, struck head on sink, alert to baseline (hx of alzheimer's), denies LOC. No anticoagulants.   VS: 120/60, 72Hr, 14Resp.

## 2014-07-30 ENCOUNTER — Emergency Department (HOSPITAL_COMMUNITY): Payer: Medicare Other

## 2014-07-30 NOTE — Discharge Instructions (Signed)

## 2014-07-30 NOTE — ED Notes (Signed)
Bed: WA03 Expected date:  Expected time:  Means of arrival:  Comments: 

## 2014-07-30 NOTE — ED Notes (Signed)
Bed: WA05 Expected date:  Expected time:  Means of arrival:  Comments: 

## 2014-07-30 NOTE — ED Provider Notes (Signed)
CSN: [id number]     Arrival date & time 07/29/14  2343 History   First MD Initiated Contact with Patient 07/30/14 0000     Chief Complaint  Patient presents with  . Fall     (Consider location/radiation/quality/duration/timing/severity/associated sxs/prior Treatment) Patient is a 81 y.o. female presenting with fall. The history is provided by the nursing home and the patient. The history is limited by the condition of the patient (Dementia).  Fall  She is reported to have fallen in the bathroom hitting her head on the sink. Patient remembers falling but cannot tell me if there was any injury. Per her transfer record, she is not on any anticoagulants or antiplatelet agents.  Past Medical History  Diagnosis Date  . Hypertension   . Shingles   . Thyroid disease   . Dementia    History reviewed. No pertinent past surgical history. No family history on file. History  Substance Use Topics  . Smoking status: Never Smoker   . Smokeless tobacco: Not on file  . Alcohol Use: Not on file   OB History    No data available     Review of Systems  Unable to perform ROS: Dementia      Allergies  Review of patient's allergies indicates no known allergies.  Home Medications   Prior to Admission medications   Medication Sig Start Date End Date Taking? Authorizing Provider  amLODipine (NORVASC) 5 MG tablet Take 5 mg by mouth daily.    Historical Provider, MD  levothyroxine (SYNTHROID, LEVOTHROID) 25 MCG tablet Take 25 mcg by mouth daily before breakfast.    Historical Provider, MD  LORazepam (ATIVAN) 0.5 MG tablet Take 1 tablet (0.5 mg total) by mouth 3 (three) times daily as needed for anxiety. Patient taking differently: Take 0.5 mg by mouth every 6 (six) hours as needed for anxiety.  05/25/14   Lisette Abu, PA-C  losartan (COZAAR) 50 MG tablet Take 50 mg by mouth daily.    Historical Provider, MD  nystatin-triamcinolone (MYCOLOG II) cream Apply 1 application topically 3 (three)  times daily as needed (vaginal burning).    Historical Provider, MD  PARoxetine (PAXIL) 10 MG tablet Take 10 mg by mouth at bedtime.    Historical Provider, MD  traMADol (ULTRAM) 50 MG tablet Take 1 tablet (50 mg total) by mouth every 6 (six) hours as needed (pain). 05/25/14   Lisette Abu, PA-C   BP 132/51 mmHg  Pulse 68  Temp(Src) 98 F (36.7 C) (Oral)  Resp 16  SpO2 96% Physical Exam  Nursing note and vitals reviewed.  81 year old female, resting comfortably and in no acute distress. Vital signs are normal. Oxygen saturation is 96%, which is normal. Head is normocephalic and atraumatic. PERRLA, EOMI. Oropharynx is clear. Neck is nontender without adenopathy or JVD. Back is nontender and there is no CVA tenderness. Lungs are clear without rales, wheezes, or rhonchi. Chest is nontender. Heart has regular rate and rhythm without murmur. Abdomen is soft, flat, nontender without masses or hepatosplenomegaly and peristalsis is normoactive. Extremities have no cyanosis or edema, full range of motion is present. Ecchymosis is noted on the right upper arm as well as skin tear in the right forearm. Skin is warm and dry without rash. Neurologic: She is oriented to person but not place or time, cranial nerves are intact, there are no motor or sensory deficits.  ED Course  Procedures (including critical care time)  Imaging Review Ct Head Wo Contrast  07/30/2014   CLINICAL DATA:  [place] in bathroom, striking head on syncope.  EXAM: CT HEAD WITHOUT CONTRAST  CT CERVICAL SPINE WITHOUT CONTRAST  TECHNIQUE: Multidetector CT imaging of the head and cervical spine was performed following the standard protocol without intravenous contrast. Multiplanar CT image reconstructions of the cervical spine were also generated.  COMPARISON:  None.  FINDINGS: CT HEAD FINDINGS  There is no intracranial hemorrhage, mass or evidence of acute infarction. There is moderately severe generalized atrophy. There is moderate  chronic microvascular ischemic change. There is no significant extra-axial fluid collection.  No acute intracranial findings are evident. Calvarium and skullbase are intact. There is irregularity of the right zygomatic arch but there is no overlying soft tissue swelling or subcutaneous edema, and this may represent sequelae from old trauma rather than an acute zygomatic arch fracture.  CT CERVICAL SPINE FINDINGS  The vertebral column, pedicles and facet articulations are intact. There is no evidence of acute fracture. No acute soft tissue abnormalities are evident.  Moderately severe atlantoaxial degenerative changes are present. There is severe facet arthritis, with mild degenerative anterolisthesis at C4-5 and at C5-6.  IMPRESSION: *Moderately severe atrophy and chronic microvascular ischemic change. No acute intracranial findings. *Negative for acute cervical spine fracture.   Electronically Signed   By: Andreas Newport M.D.   On: 07/30/2014 01:51   Ct Cervical Spine Wo Contrast  07/30/2014   CLINICAL DATA:  [place] in bathroom, striking head on syncope.  EXAM: CT HEAD WITHOUT CONTRAST  CT CERVICAL SPINE WITHOUT CONTRAST  TECHNIQUE: Multidetector CT imaging of the head and cervical spine was performed following the standard protocol without intravenous contrast. Multiplanar CT image reconstructions of the cervical spine were also generated.  COMPARISON:  None.  FINDINGS: CT HEAD FINDINGS  There is no intracranial hemorrhage, mass or evidence of acute infarction. There is moderately severe generalized atrophy. There is moderate chronic microvascular ischemic change. There is no significant extra-axial fluid collection.  No acute intracranial findings are evident. Calvarium and skullbase are intact. There is irregularity of the right zygomatic arch but there is no overlying soft tissue swelling or subcutaneous edema, and this may represent sequelae from old trauma rather than an acute zygomatic arch fracture.  CT  CERVICAL SPINE FINDINGS  The vertebral column, pedicles and facet articulations are intact. There is no evidence of acute fracture. No acute soft tissue abnormalities are evident.  Moderately severe atlantoaxial degenerative changes are present. There is severe facet arthritis, with mild degenerative anterolisthesis at C4-5 and at C5-6.  IMPRESSION: *Moderately severe atrophy and chronic microvascular ischemic change. No acute intracranial findings. *Negative for acute cervical spine fracture.   Electronically Signed   By: Andreas Newport M.D.   On: 07/30/2014 01:51     MDM   Final diagnoses:  Fall at nursing home, initial encounter  Fall at nursing home, initial encounter    Fall at nursing home without obvious injury. She will be sent for CT of head and cervical spine.  CT scans are unremarkable. She is discharged back to her skilled nursing facility.  Delora Fuel, MD 02/54/27 0623

## 2014-07-30 NOTE — ED Notes (Signed)
[facility] has been notified for transportation back to facility.

## 2014-07-30 NOTE — ED Notes (Signed)
Patient is able to move her neck around with grimacing or guarding her neck. No signs of trauma to her head.

## 2014-07-30 NOTE — ED Notes (Signed)
Bed: WA04 Expected date:  Expected time:  Means of arrival:  Comments: 

## 2014-07-30 NOTE — ED Notes (Signed)
Ambulated to restroom and back to room with assistance. Pt tolerated it well for her condition and age.

## 2014-08-31 ENCOUNTER — Emergency Department (HOSPITAL_COMMUNITY): Payer: Medicare Other

## 2014-08-31 ENCOUNTER — Encounter (HOSPITAL_COMMUNITY): Payer: Self-pay | Admitting: Emergency Medicine

## 2014-08-31 ENCOUNTER — Other Ambulatory Visit (HOSPITAL_COMMUNITY): Payer: Self-pay

## 2014-08-31 ENCOUNTER — Observation Stay (HOSPITAL_COMMUNITY)
Admission: EM | Admit: 2014-08-31 | Discharge: 2014-09-01 | Disposition: A | Discharge status: Home / Self Care | Payer: Medicare Other | Attending: Oncology | Admitting: Oncology

## 2014-08-31 DIAGNOSIS — Z79899 Other long term (current) drug therapy: Secondary | ICD-10-CM | POA: Insufficient documentation

## 2014-08-31 DIAGNOSIS — F0390 Unspecified dementia without behavioral disturbance: Secondary | ICD-10-CM | POA: Diagnosis not present

## 2014-08-31 DIAGNOSIS — Y9389 Activity, other specified: Secondary | ICD-10-CM | POA: Insufficient documentation

## 2014-08-31 DIAGNOSIS — E079 Disorder of thyroid, unspecified: Secondary | ICD-10-CM | POA: Diagnosis not present

## 2014-08-31 DIAGNOSIS — R55 Syncope and collapse: Secondary | ICD-10-CM | POA: Diagnosis present

## 2014-08-31 DIAGNOSIS — W1839XA Other fall on same level, initial encounter: Secondary | ICD-10-CM | POA: Diagnosis not present

## 2014-08-31 DIAGNOSIS — Z8619 Personal history of other infectious and parasitic diseases: Secondary | ICD-10-CM | POA: Diagnosis not present

## 2014-08-31 DIAGNOSIS — S51001A Unspecified open wound of right elbow, initial encounter: Secondary | ICD-10-CM | POA: Diagnosis not present

## 2014-08-31 DIAGNOSIS — I472 Ventricular tachycardia: Principal | ICD-10-CM | POA: Insufficient documentation

## 2014-08-31 DIAGNOSIS — Y998 Other external cause status: Secondary | ICD-10-CM | POA: Insufficient documentation

## 2014-08-31 DIAGNOSIS — S0101XA Laceration without foreign body of scalp, initial encounter: Secondary | ICD-10-CM | POA: Insufficient documentation

## 2014-08-31 DIAGNOSIS — Y92129 Unspecified place in nursing home as the place of occurrence of the external cause: Secondary | ICD-10-CM | POA: Diagnosis not present

## 2014-08-31 DIAGNOSIS — I4729 Other ventricular tachycardia: Secondary | ICD-10-CM

## 2014-08-31 DIAGNOSIS — E039 Hypothyroidism, unspecified: Secondary | ICD-10-CM | POA: Diagnosis not present

## 2014-08-31 HISTORY — DX: Hypothyroidism, unspecified: E03.9

## 2014-08-31 LAB — BASIC METABOLIC PANEL
ANION GAP: 9 (ref 5–15)
BUN: 19 mg/dL (ref 6–20)
CO2: 28 mmol/L (ref 22–32)
CREATININE: 0.9 mg/dL (ref 0.44–1.00)
Calcium: 9.2 mg/dL (ref 8.9–10.3)
Chloride: 105 mmol/L (ref 101–111)
GFR calc Af Amer: >60 mL/min (ref >60)
GFR calc non Af Amer: 55 mL/min — ABNORMAL LOW (ref >60)
Glucose, Bld: 103 mg/dL — ABNORMAL HIGH (ref 65–99)
POTASSIUM: 4.1 mmol/L (ref 3.5–5.1)
Sodium: 142 mmol/L (ref 135–145)

## 2014-08-31 LAB — CBC WITH DIFFERENTIAL/PLATELET
BASOS ABS: 0 10*3/uL (ref 0.0–0.1)
Basophils Relative: 0 % (ref 0–1)
EOS ABS: 0.3 10*3/uL (ref 0.0–0.7)
EOS PCT: 5 % (ref 0–5)
HCT: 43.2 % (ref 36.0–46.0)
Hemoglobin: 13.5 g/dL (ref 12.0–15.0)
LYMPHS ABS: 3.3 10*3/uL (ref 0.7–4.0)
LYMPHS PCT: 44 % (ref 12–46)
MCH: 27.8 pg (ref 26.0–34.0)
MCHC: 31.3 g/dL (ref 30.0–36.0)
MCV: 89.1 fL (ref 78.0–100.0)
Monocytes Absolute: 0.6 10*3/uL (ref 0.1–1.0)
Monocytes Relative: 9 % (ref 3–12)
NEUTROS PCT: 42 % — AB (ref 43–77)
Neutro Abs: 3 10*3/uL (ref 1.7–7.7)
Platelets: 274 10*3/uL (ref 150–400)
RBC: 4.85 MIL/uL (ref 3.87–5.11)
RDW: 14.4 % (ref 11.5–15.5)
WBC: 7.3 10*3/uL (ref 4.0–10.5)

## 2014-08-31 LAB — CREATININE, SERUM
CREATININE: 0.81 mg/dL (ref 0.44–1.00)
GFR calc Af Amer: >60 mL/min (ref >60)
GFR calc non Af Amer: >60 mL/min (ref >60)

## 2014-08-31 LAB — CBC
HEMATOCRIT: 43.3 % (ref 36.0–46.0)
Hemoglobin: 13.8 g/dL (ref 12.0–15.0)
MCH: 28.2 pg (ref 26.0–34.0)
MCHC: 31.9 g/dL (ref 30.0–36.0)
MCV: 88.5 fL (ref 78.0–100.0)
Platelets: 282 10*3/uL (ref 150–400)
RBC: 4.89 MIL/uL (ref 3.87–5.11)
RDW: 14.3 % (ref 11.5–15.5)
WBC: 8.7 10*3/uL (ref 4.0–10.5)

## 2014-08-31 LAB — TROPONIN I

## 2014-08-31 LAB — MRSA PCR SCREENING: MRSA by PCR: NEGATIVE

## 2014-08-31 LAB — MAGNESIUM: MAGNESIUM: 1.9 mg/dL (ref 1.7–2.4)

## 2014-08-31 LAB — TSH: TSH: 0.445 u[IU]/mL (ref 0.350–4.500)

## 2014-08-31 LAB — VALPROIC ACID LEVEL: VALPROIC ACID LVL: 16 ug/mL — AB (ref 50.0–100.0)

## 2014-08-31 MED ORDER — LOSARTAN POTASSIUM 50 MG PO TABS
50.0000 mg | ORAL_TABLET | Freq: Every day | ORAL | Status: DC
  Administered 2014-08-31 – 2014-09-01 (×2): 50 mg via ORAL
  Filled 2014-08-31 (×2): qty 1

## 2014-08-31 MED ORDER — LORAZEPAM 0.5 MG PO TABS
0.2500 mg | ORAL_TABLET | Freq: Once | ORAL | Status: AC
  Filled 2014-08-31: qty 1

## 2014-08-31 MED ORDER — PAROXETINE HCL 10 MG PO TABS
5.0000 mg | ORAL_TABLET | Freq: Every morning | ORAL | Status: DC
  Administered 2014-08-31 – 2014-09-01 (×2): 5 mg via ORAL
  Filled 2014-08-31 (×2): qty 0.5

## 2014-08-31 MED ORDER — LEVOTHYROXINE SODIUM 25 MCG PO TABS
25.0000 ug | ORAL_TABLET | Freq: Every day | ORAL | Status: DC
  Administered 2014-09-01: 25 ug via ORAL
  Filled 2014-08-31: qty 1

## 2014-08-31 MED ORDER — LORAZEPAM 0.5 MG PO TABS
0.2500 mg | ORAL_TABLET | Freq: Three times a day (TID) | ORAL | Status: DC | PRN
  Administered 2014-08-31: 0.25 mg via ORAL

## 2014-08-31 MED ORDER — AMLODIPINE BESYLATE 5 MG PO TABS
5.0000 mg | ORAL_TABLET | Freq: Every day | ORAL | Status: DC
  Administered 2014-08-31 – 2014-09-01 (×2): 5 mg via ORAL
  Filled 2014-08-31 (×2): qty 1

## 2014-08-31 MED ORDER — DIVALPROEX SODIUM 125 MG PO DR TAB
125.0000 mg | DELAYED_RELEASE_TABLET | Freq: Every day | ORAL | Status: DC
  Administered 2014-08-31: 125 mg via ORAL
  Filled 2014-08-31 (×2): qty 1

## 2014-08-31 MED ORDER — SODIUM CHLORIDE 0.9 % IJ SOLN
3.0000 mL | Freq: Two times a day (BID) | INTRAMUSCULAR | Status: DC
  Administered 2014-08-31 (×2): 3 mL via INTRAVENOUS

## 2014-08-31 MED ORDER — LIDOCAINE-EPINEPHRINE (PF) 2 %-1:200000 IJ SOLN
10.0000 mL | Freq: Once | INTRAMUSCULAR | Status: AC
  Administered 2014-08-31: 10 mL
  Filled 2014-08-31: qty 20

## 2014-08-31 MED ORDER — AMIODARONE HCL 200 MG PO TABS
200.0000 mg | ORAL_TABLET | Freq: Two times a day (BID) | ORAL | Status: DC
  Administered 2014-08-31 – 2014-09-01 (×2): 200 mg via ORAL
  Filled 2014-08-31 (×2): qty 1

## 2014-08-31 MED ORDER — ENOXAPARIN SODIUM 40 MG/0.4ML ~~LOC~~ SOLN
40.0000 mg | SUBCUTANEOUS | Status: DC
  Administered 2014-08-31 – 2014-09-01 (×2): 40 mg via SUBCUTANEOUS
  Filled 2014-08-31 (×2): qty 0.4

## 2014-08-31 MED ORDER — BACITRACIN 500 UNIT/GM EX OINT
1.0000 "application " | TOPICAL_OINTMENT | Freq: Two times a day (BID) | CUTANEOUS | Status: DC
  Administered 2014-08-31 (×2): 1 via TOPICAL
  Filled 2014-08-31: qty 0.9

## 2014-08-31 MED ORDER — TETANUS-DIPHTH-ACELL PERTUSSIS 5-2.5-18.5 LF-MCG/0.5 IM SUSP
0.5000 mL | Freq: Once | INTRAMUSCULAR | Status: AC
  Administered 2014-08-31: 0.5 mL via INTRAMUSCULAR
  Filled 2014-08-31: qty 0.5

## 2014-08-31 NOTE — Progress Notes (Signed)
Sitter reported at 2300. Attempted twice to place cardiac leads on pt and also mittens, but pt pulled them off just as fast as applied. Ativan 0.25 given at 2215, no effect. Pt currently asleep but becomes very agitated when touched

## 2014-08-31 NOTE — Progress Notes (Signed)
Paged Teaching Service on call

## 2014-08-31 NOTE — Consult Note (Addendum)
ELECTROPHYSIOLOGY CONSULT NOTE    Patient ID: Tracy Reeves MRN: [id number], DOB/AGE: Mar 15, 1923 81 y.o.  Admit date: 08/31/2014 Date of Consult: 08/31/2014  Primary Physician: No primary care provider on file. Primary Cardiologist: new to [facility]  Reason for Consultation: VT  HPI:  Tracy Reeves is a 81 y.o. female with a past medical history significant for hypertension, hypothyroidism, and advanced dementia. She lives in a SNF and is her Chauncey Reading is her daughter. She is DNR/DNI.  The patient is unable to contribute to her history.  This morning, while getting dressed, she fell and sustained a scalp laceration.  It is unclear if she had a syncopal spell. She was brought to [facility] for further evaluation.  While in the ER, she had a run of VT that self terminated and is unclear how symptomatic the patient was.  EP has been asked to evaluate for treatment options.   She is unable to provide review of systems.   Past Medical History  Diagnosis Date  . Hypertension   . Shingles   . Thyroid disease   . Dementia      Surgical History: History reviewed. No pertinent past surgical history.   Prescriptions prior to admission  Medication Sig Dispense Refill Last Dose  . amLODipine (NORVASC) 5 MG tablet Take 5 mg by mouth daily.   08/30/2014 at Unknown time  . divalproex (DEPAKOTE) 125 MG DR tablet Take 125 mg by mouth at bedtime.   08/30/2014 at Unknown time  . levothyroxine (SYNTHROID, LEVOTHROID) 25 MCG tablet Take 25 mcg by mouth daily before breakfast.   08/30/2014  . LORazepam (ATIVAN) 0.5 MG tablet Take 1 tablet (0.5 mg total) by mouth 3 (three) times daily as needed for anxiety. (Patient taking differently: Take 0.25 mg by mouth 3 (three) times daily as needed for anxiety (qam prn, 2pm prn, and qpm prn for anxiety or agitation). qam prn and 2pm prn agitation or anxiety) 9 tablet 0 08/30/2014  . losartan (COZAAR) 50 MG tablet Take 50 mg by mouth daily.   08/30/2014 at Unknown time  .  PARoxetine (PAXIL) 10 MG tablet Take 5 mg by mouth every morning.    08/30/2014 at Unknown time  . saccharomyces boulardii (FLORASTOR) 250 MG capsule Take 250 mg by mouth 2 (two) times daily.   08/30/2014 at pm  . traMADol (ULTRAM) 50 MG tablet Take 1 tablet (50 mg total) by mouth every 6 (six) hours as needed (pain). (Patient taking differently: Take 25 mg by mouth 3 (three) times daily as needed (for pain). ) 12 tablet 0 unknown    Inpatient Medications:  . bacitracin  1 application Topical BID    Allergies:  Allergies  Allergen Reactions  . Eggs Or Egg-Derived Products     Unknown reaction    History   Social History  . Marital Status: Unknown    Spouse Name: N/A  . Number of Children: N/A  . Years of Education: N/A   Occupational History  . Not on file.   Social History Main Topics  . Smoking status: Never Smoker   . Smokeless tobacco: Not on file  . Alcohol Use: Not on file  . Drug Use: Not on file  . Sexual Activity: Not on file   Other Topics Concern  . Not on file   Social History Narrative     Family History - pt is unable to provide  Review of Systems: All other systems reviewed and are otherwise negative except as noted  above.  Physical Exam: Filed Vitals:   08/31/14 0647 08/31/14 0833 08/31/14 0951  BP: 133/68 123/72 118/52  Pulse: 83 76 83  Temp: 98.5 F (36.9 C)    TempSrc: Oral    Resp: 18 18 20   SpO2: 100% 99% 96%    GEN- The patient is elderly and frail appearing, oriented only to person  HEENT: normocephalic, right scalp laceration with staples; sclera clear, conjunctiva pink; hearing intact; oropharynx clear; neck supple, no JVP Lymph- no cervical lymphadenopathy Lungs- Clear to ausculation bilaterally, normal work of breathing.  Scattered rhonchi Heart- Regular rate and rhythm, no murmurs, rubs or gallops  GI- soft, non-tender, non-distended, bowel sounds present  Extremities- no clubbing, cyanosis, or edema  MS- no significant deformity  or atrophy Skin- warm and dry, no rash or lesion Psych- euthymic mood, full affect Neuro- strength and sensation are intact  Labs:   Lab Results  Component Value Date   WBC 7.3 08/31/2014   HGB 13.5 08/31/2014   HCT 43.2 08/31/2014   MCV 89.1 08/31/2014   PLT 274 08/31/2014    Recent Labs Lab 08/31/14 0705  NA 142  K 4.1  CL 105  CO2 28  BUN 19  CREATININE 0.90  CALCIUM 9.2  GLUCOSE 103*      Radiology/Studies: Ct Head Wo Contrast 08/31/2014   CLINICAL DATA:  Nursing home patient fell, head laceration RIGHT scalp, history hypertension, Alzheimer's dementia  EXAM: CT HEAD WITHOUT CONTRAST  CT CERVICAL SPINE WITHOUT CONTRAST  TECHNIQUE: Multidetector CT imaging of the head and cervical spine was performed following the standard protocol without intravenous contrast. Multiplanar CT image reconstructions of the cervical spine were also generated.  COMPARISON:  None  FINDINGS: CT HEAD FINDINGS  Generalized atrophy.  Normal ventricular morphology.  No midline shift or mass effect.  Small vessel chronic ischemic changes of deep cerebral white matter including pons.  No intracranial hemorrhage, mass lesion, or evidence of acute infarction.  No extra-axial fluid collections.  Small RIGHT parietal scalp hematoma.  Skull intact.  Tiny air-fluid level in sphenoid sinus appear.  Paranasal sinuses and mastoid air cells otherwise clear.  Extensive atherosclerotic calcifications of the internal carotid arteries bilaterally at the carotid siphons.  CT CERVICAL SPINE FINDINGS  Motion artifacts, for which repeat imaging was performed.  Disc space narrowing and endplate spur formation greatest at C6-C7.  Advanced multilevel facet degenerative changes with mild anterolisthesis at C4-C5 and C5-C6.  Vertebral body heights maintained without fracture or bone destruction.  Lung apices clear.  Prevertebral soft tissues normal thickness.  Scattered atherosclerotic calcifications.  Diffuse enlargement of both  thyroid lobes, multinodular in appearance.  IMPRESSION: Atrophy with small vessel chronic ischemic changes of deep cerebral white matter.  No acute intracranial abnormalities.  Multilevel degenerative disc and facet disease changes of the cervical spine as above.  No acute cervical spine abnormalities.  Extensive atherosclerotic calcifications.  Enlarged multinodular thyroid gland.   Electronically Signed   By: Lavonia Dana M.D.   On: 08/31/2014 08:22   Dg Chest Portable 1 View 08/31/2014   CLINICAL DATA:  Shortness of breath.  Recent fall.  EXAM: PORTABLE CHEST - 1 VIEW  COMPARISON:  None.  FINDINGS: Heart size is mildly enlarged. Both lungs are clear. No evidence of pneumothorax or pleural effusion.  IMPRESSION: Mild cardiomegaly.  No active lung disease.   Electronically Signed   By: Earle Gell M.D.   On: 08/31/2014 07:20   WFU:XNATF rhythm, rate 75, IVCD, QRS 152  TELEMETRY: sinus rhythm with episode of monomorphic VT  Assessment/Plan: 1.  Syncope with documented ventricular tachycardia The patient has syncope with subsequent documented ventricular tachycardia.  With advanced dementia and DNR/DNI, would recommend amiodarone at this time, only for symptom relief.  She is not a candidate for EP procedures.  Keep K >3.9, Mg >1.8 Start amiodarone 400mg  daily for 2 weeks then 200mg  daily Would not pursue further cardiac testing at this time  Dr Lovena Le to see later today.  EP to see as needed while here. Please call with questions.    Signed, Chanetta Marshall, NP 08/31/2014 10:41 AM   EP Attending  Patient seen and examined. Agree with the findings as documented above by Chanetta Marshall, NP-C. The patient had syncope, possibly due to VT in the setting of advanced age and dementia. Using amiodarone as above would be reasonable. However, with advanced dementia, it would also be reasonable not to treat her VT. Invasive EP testing would not be appropriate at this point. Please call for additional  questions.  Gregg Taylor,M.D.    I spoke to the patient's daughter who would like for her to be placed on amiodarone. Will start tonight.  Mikle Bosworth.D.

## 2014-08-31 NOTE — Clinical Social Work Note (Signed)
Clinical Social Work Assessment  Patient Details  Name: Tracy Reeves MRN: [id number] Date of Birth: Oct 06, 1922  Date of referral:  08/31/14               Reason for consult:  Discharge Planning, Facility Placement                Permission sought to share information with:  Facility Sport and exercise psychologist, Family Supports Permission granted to share information::  No (Patient with dementia.)  Name::     Diane  Agency::  [facility]  Relationship::  Daughter  Contact Information:  [phone]  Housing/Transportation Living arrangements for the past 2 months:  [place] ([facility]) Source of Information:  Adult Children Patient Interpreter Needed:  None Criminal Activity/Legal Involvement Pertinent to Current Situation/Hospitalization:  No - Comment as needed Significant Relationships:  Adult Children Lives with:  Facility Resident Do you feel safe going back to the place where you live?  Yes Need for family participation in patient care:  Yes (Comment)  Care giving concerns:  Patient's daughter expressed no concerns at this time.   Social Worker assessment / plan:  CSW received referral patient admitted from ALF. CSW confirmed information with patient's daughter. Per patient's daughter, patient to return to [facility] at time of discharge. CSW to continue to follow and assist with discharge planning needs.  Employment status:  Retired Nurse, adult PT Recommendations:  Not assessed at this time Information / Referral to community resources:  Other (Comment Required) (Patient returning to ALF)  Patient/Family's Response to care:  Patient's daughter understanding and agreeable to CSW plan of care.  Patient/Family's Understanding of and Emotional Response to Diagnosis, Current Treatment, and Prognosis:  Patient's daughter understanding and agreeable to CSW plan of care.  Emotional Assessment Appearance:  Other (Comment  Required (CSW spoke with patient's daughter, Diane.) Attitude/Demeanor/Rapport:  Other (CSW spoke with patient's daughter, Shauna Hugh.) Affect (typically observed):  Other (CSW spoke with patient's daughter, Shauna Hugh.) Orientation:  Oriented to Self Alcohol / Substance use:  Not Applicable Psych involvement (Current and /or in the community):  No (Comment)  Discharge Needs  Concerns to be addressed:  No discharge needs identified Readmission within the last 30 days:  No Current discharge risk:  None Barriers to Discharge:  No Barriers Identified   Caroline Sauger, LCSW 08/31/2014, 3:44 PM [phone]

## 2014-08-31 NOTE — Progress Notes (Signed)
Patient continues to pull telemetry off since admit to the floor.  Pulled peripheral IV out also.  Dr. Ethelene Hal paged and notified.  Awaiting EP consult and recommendations.  Will attempt to reapply telemetry leads and place mittens on patient.  Sanda Linger
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
## 2014-08-31 NOTE — ED Notes (Signed)
EDP at bedside performing suture care to R sided scalp laceration. Pt tolerated without difficulty. Staples in place.

## 2014-08-31 NOTE — Progress Notes (Signed)
Have made multiple attempts to replace telemetry on pt, but increases her agitation. She has gotten out of bed several times and wandered aimlessly until redirected to chair or bed. Unable to keep mittens on pt either. Will report same to Cards on-call

## 2014-08-31 NOTE — Progress Notes (Signed)
Placed mittens on pt and replaced leads. Pt pulled off leads in less than 1 minute
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
## 2014-08-31 NOTE — ED Notes (Signed)
ZOLL pads placed, code cart at bedside
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
## 2014-08-31 NOTE — ED Notes (Signed)
Pt had episode of nonsustained VTACH lasting two seconds, Dr. Winfred Leeds present during event.

## 2014-08-31 NOTE — ED Notes (Signed)
Assisted Dr. Winfred Leeds, MD with stapling of patient's laceration to her head; Dr. Winfred Leeds, MD numbed area with numbing medicine, then cleaned and irrigated with normal saline then proceeded to staple laceration together with myself holding patient's head still and patient's daughter was standing at bedside

## 2014-08-31 NOTE — ED Notes (Signed)
Attempted to call report x1. Nurse to call back. 

## 2014-08-31 NOTE — ED Provider Notes (Signed)
CSN: [id number]     Arrival date & time 08/31/14  5366 History   First MD Initiated Contact with Patient 08/31/14 [phone]     Chief Complaint  Patient presents with  . Fall     (Consider location/radiation/quality/duration/timing/severity/associated sxs/prior Treatment) HPI Level V caveat dementia. History is from Lin Landsman, med tech at [employer] via telephone. patient fell this morning 5:30 AM she suffered syncopal event. Was unconscious for "approximately 2 seconds". She presently offers no complaint. She is brought by EMS . Treated with hard cervical collar prior to coming here. She suffered laceration and skin tear to right elbow as result of event. Past Medical History  Diagnosis Date  . Hypertension   . Shingles   . Thyroid disease   . Dementia    History reviewed. No pertinent past surgical history. No family history on file. History  Substance Use Topics  . Smoking status: Never Smoker   . Smokeless tobacco: Not on file  . Alcohol Use: Not on file   OB History    No data available     Review of Systems  Unable to perform ROS: Dementia  Skin: Positive for wound.       Scalp laceration, skin tear right elbow      Allergies  Eggs or egg-derived products  Home Medications   Prior to Admission medications   Medication Sig Start Date End Date Taking? Authorizing Provider  amLODipine (NORVASC) 5 MG tablet Take 5 mg by mouth daily.    Historical Provider, MD  divalproex (DEPAKOTE) 125 MG DR tablet Take 125 mg by mouth at bedtime.    Historical Provider, MD  levothyroxine (SYNTHROID, LEVOTHROID) 25 MCG tablet Take 25 mcg by mouth daily before breakfast.    Historical Provider, MD  LORazepam (ATIVAN) 0.5 MG tablet Take 1 tablet (0.5 mg total) by mouth 3 (three) times daily as needed for anxiety. Patient taking differently: Take 0.5 mg by mouth every 6 (six) hours as needed for anxiety.  05/25/14   Lisette Abu, PA-C  losartan (COZAAR) 50 MG tablet  Take 50 mg by mouth daily.    Historical Provider, MD  nystatin-triamcinolone (MYCOLOG II) cream Apply 1 application topically 3 (three) times daily as needed (vaginal burning).    Historical Provider, MD  PARoxetine (PAXIL) 10 MG tablet Take 10 mg by mouth at bedtime.    Historical Provider, MD  saccharomyces boulardii (FLORASTOR) 250 MG capsule Take 250 mg by mouth 2 (two) times daily.    Historical Provider, MD  traMADol (ULTRAM) 50 MG tablet Take 1 tablet (50 mg total) by mouth every 6 (six) hours as needed (pain). 05/25/14   Lisette Abu, PA-C   BP 133/68 mmHg  Pulse 83  Temp(Src) 98.5 F (36.9 C) (Oral)  Resp 18  SpO2 100% Physical Exam  Constitutional:  Chronically ill-appearing  HENT:  3 cm linear laceration right parietal area no surrounding hematoma otherwise normal cephalic atraumatic  Eyes: Conjunctivae are normal. Pupils are equal, round, and reactive to light.  Neck: Neck supple. No tracheal deviation present. No thyromegaly present.  Cardiovascular: Normal rate and regular rhythm.   No murmur heard. Pulmonary/Chest: Effort normal and breath sounds normal.  Abdominal: Soft. Bowel sounds are normal. She exhibits no distension. There is no tenderness.  Musculoskeletal: Normal range of motion. She exhibits no edema or tenderness.  Entire spine nontender pelvis stable nontender  Neurological: She is alert. Coordination normal.  Follow simple commands moves all extremities cranial nerves II  through XII grossly intact  Skin: Skin is warm and dry. No rash noted.  Dime sized abrasion right lateral elbow  Psychiatric: She has a normal mood and affect.  Nursing note and vitals reviewed.   ED Course  Procedures (including critical care time) Labs Review Labs Reviewed - No data to display  Imaging Review No results found.   EKG Interpretation None     Placed on cardiac monitor chronic monitor initially showed normal sinus rhythm. At 7 AM cardiac rhythm changed to  ventricular tachycardia patient remained alert. She suffered nonsustained ventricular tachycardia for approximately 1 minute. Resolved spontaneously, and return to normal sinus rhythm..  9:15 AM patient remains alert follows commands moves all extremities. Remains in normal sinus rhythm. Chest X-ray viewed by me LACERATION REPAIR Performed by: Orlie Dakin Authorized by: Orlie Dakin Consent: Verbal consent obtained. Risks and benefits: risks, benefits and alternatives were discussed Consent given by: patient Patient identity confirmed: provided demographic data Prepped and Draped in normal sterile fashion Wound explored  Laceration Location: scalp  Laceration Length: 3cm  No Foreign Bodies seen or palpated  Anesthesia: local infiltration  Local anesthetic: lidocaine 2% with epinephrine  Anesthetic total: 2 ml  Irrigation method: syringe Amount of cleaning: standard  Skin closure: staples      Patient tolerance: Patient tolerated the procedure well with no immediate complications.  Results for orders placed or performed during the hospital encounter of 31/49/70  Basic metabolic panel  Result Value Ref Range   Sodium 142 135 - 145 mmol/L   Potassium 4.1 3.5 - 5.1 mmol/L   Chloride 105 101 - 111 mmol/L   CO2 28 22 - 32 mmol/L   Glucose, Bld 103 (H) 65 - 99 mg/dL   BUN 19 6 - 20 mg/dL   Creatinine, Ser 0.90 0.44 - 1.00 mg/dL   Calcium 9.2 8.9 - 10.3 mg/dL   GFR calc non Af Amer 55 (L) >60 mL/min   GFR calc Af Amer >60 >60 mL/min   Anion gap 9 5 - 15  CBC with Differential  Result Value Ref Range   WBC 7.3 4.0 - 10.5 K/uL   RBC 4.85 3.87 - 5.11 MIL/uL   Hemoglobin 13.5 12.0 - 15.0 g/dL   HCT 43.2 36.0 - 46.0 %   MCV 89.1 78.0 - 100.0 fL   MCH 27.8 26.0 - 34.0 pg   MCHC 31.3 30.0 - 36.0 g/dL   RDW 14.4 11.5 - 15.5 %   Platelets 274 150 - 400 K/uL   Neutrophils Relative % 42 (L) 43 - 77 %   Neutro Abs 3.0 1.7 - 7.7 K/uL   Lymphocytes Relative 44 12 - 46 %    Lymphs Abs 3.3 0.7 - 4.0 K/uL   Monocytes Relative 9 3 - 12 %   Monocytes Absolute 0.6 0.1 - 1.0 K/uL   Eosinophils Relative 5 0 - 5 %   Eosinophils Absolute 0.3 0.0 - 0.7 K/uL   Basophils Relative 0 0 - 1 %   Basophils Absolute 0.0 0.0 - 0.1 K/uL  Troponin I  Result Value Ref Range   Troponin I <0.03 <0.031 ng/mL  Valproic acid level  Result Value Ref Range   Valproic Acid Lvl 16 (L) 50.0 - 100.0 ug/mL   Ct Head Wo Contrast  08/31/2014   CLINICAL DATA:  Nursing home patient fell, head laceration RIGHT scalp, history hypertension, Alzheimer's dementia  EXAM: CT HEAD WITHOUT CONTRAST  CT CERVICAL SPINE WITHOUT CONTRAST  TECHNIQUE: Multidetector CT imaging of  the head and cervical spine was performed following the standard protocol without intravenous contrast. Multiplanar CT image reconstructions of the cervical spine were also generated.  COMPARISON:  None  FINDINGS: CT HEAD FINDINGS  Generalized atrophy.  Normal ventricular morphology.  No midline shift or mass effect.  Small vessel chronic ischemic changes of deep cerebral white matter including pons.  No intracranial hemorrhage, mass lesion, or evidence of acute infarction.  No extra-axial fluid collections.  Small RIGHT parietal scalp hematoma.  Skull intact.  Tiny air-fluid level in sphenoid sinus appear.  Paranasal sinuses and mastoid air cells otherwise clear.  Extensive atherosclerotic calcifications of the internal carotid arteries bilaterally at the carotid siphons.  CT CERVICAL SPINE FINDINGS  Motion artifacts, for which repeat imaging was performed.  Disc space narrowing and endplate spur formation greatest at C6-C7.  Advanced multilevel facet degenerative changes with mild anterolisthesis at C4-C5 and C5-C6.  Vertebral body heights maintained without fracture or bone destruction.  Lung apices clear.  Prevertebral soft tissues normal thickness.  Scattered atherosclerotic calcifications.  Diffuse enlargement of both thyroid lobes,  multinodular in appearance.  IMPRESSION: Atrophy with small vessel chronic ischemic changes of deep cerebral white matter.  No acute intracranial abnormalities.  Multilevel degenerative disc and facet disease changes of the cervical spine as above.  No acute cervical spine abnormalities.  Extensive atherosclerotic calcifications.  Enlarged multinodular thyroid gland.   Electronically Signed   By: Lavonia Dana M.D.   On: 08/31/2014 08:22   Ct Cervical Spine Wo Contrast  08/31/2014   CLINICAL DATA:  Nursing home patient fell, head laceration RIGHT scalp, history hypertension, Alzheimer's dementia  EXAM: CT HEAD WITHOUT CONTRAST  CT CERVICAL SPINE WITHOUT CONTRAST  TECHNIQUE: Multidetector CT imaging of the head and cervical spine was performed following the standard protocol without intravenous contrast. Multiplanar CT image reconstructions of the cervical spine were also generated.  COMPARISON:  None  FINDINGS: CT HEAD FINDINGS  Generalized atrophy.  Normal ventricular morphology.  No midline shift or mass effect.  Small vessel chronic ischemic changes of deep cerebral white matter including pons.  No intracranial hemorrhage, mass lesion, or evidence of acute infarction.  No extra-axial fluid collections.  Small RIGHT parietal scalp hematoma.  Skull intact.  Tiny air-fluid level in sphenoid sinus appear.  Paranasal sinuses and mastoid air cells otherwise clear.  Extensive atherosclerotic calcifications of the internal carotid arteries bilaterally at the carotid siphons.  CT CERVICAL SPINE FINDINGS  Motion artifacts, for which repeat imaging was performed.  Disc space narrowing and endplate spur formation greatest at C6-C7.  Advanced multilevel facet degenerative changes with mild anterolisthesis at C4-C5 and C5-C6.  Vertebral body heights maintained without fracture or bone destruction.  Lung apices clear.  Prevertebral soft tissues normal thickness.  Scattered atherosclerotic calcifications.  Diffuse enlargement  of both thyroid lobes, multinodular in appearance.  IMPRESSION: Atrophy with small vessel chronic ischemic changes of deep cerebral white matter.  No acute intracranial abnormalities.  Multilevel degenerative disc and facet disease changes of the cervical spine as above.  No acute cervical spine abnormalities.  Extensive atherosclerotic calcifications.  Enlarged multinodular thyroid gland.   Electronically Signed   By: Lavonia Dana M.D.   On: 08/31/2014 08:22   Dg Chest Portable 1 View  08/31/2014   CLINICAL DATA:  Shortness of breath.  Recent fall.  EXAM: PORTABLE CHEST - 1 VIEW  COMPARISON:  None.  FINDINGS: Heart size is mildly enlarged. Both lungs are clear. No evidence of pneumothorax or pleural  effusion.  IMPRESSION: Mild cardiomegaly.  No active lung disease.   Electronically Signed   By: Earle Gell M.D.   On: 08/31/2014 07:20    MDM  7:35 AM I had lengthy conversation with patient's daughter and only family, Serina Cowper. This Damon requests that patient is DO NOT RESUSCITATE CODE STATUS. We had conversation about ventricular tach tachycardia. She does not want invasive procedures such as AICD however is amenable to medical therapy for her mother I spoke with Dr.Sadek from internal medicine service plan 23 hour observation telemetry unit I also called consulted [facility] for possible medical therapy for ventricular tachycardia  Final diagnoses:  None  staples should come out in 7 days Dx #1 non sustained ventricular tachycardia #2 syncope #3 scalp laceration (3cm) #4 abrasion of right elbow  CRITICAL CARE Performed by: Orlie Dakin Total critical care time: 30 minutes Critical care time was exclusive of separately billable procedures and treating other patients. Critical care was necessary to treat or prevent imminent or life-threatening deterioration. Critical care was time spent personally by me on the following activities: development of treatment  plan with patient and/or surrogate as well as nursing, discussions with consultants, evaluation of patient's response to treatment, examination of patient, obtaining history from patient or surrogate, ordering and performing treatments and interventions, ordering and review of laboratory studies, ordering and review of radiographic studies, pulse oximetry and re-evaluation of patient's condition.    Orlie Dakin, MD 08/31/14 [phone]

## 2014-08-31 NOTE — Progress Notes (Signed)
Spoke with on call DR. First order was permissible to remove telemetry. Dr called back shortly thereafter and said that she would restart pts home ativan meds PRN and wanted to replace telemetry. Sitter is expected to come in for pt, will continue to monitor

## 2014-08-31 NOTE — ED Notes (Signed)
Skin tear to R forearm covered with Bacitracin and gauze. Pt tolerated without difficulty. Admitting physician at bedside.

## 2014-09-01 ENCOUNTER — Encounter (HOSPITAL_COMMUNITY): Payer: Self-pay | Admitting: Oncology

## 2014-09-01 DIAGNOSIS — R55 Syncope and collapse: Secondary | ICD-10-CM

## 2014-09-01 DIAGNOSIS — F0280 Dementia in other diseases classified elsewhere without behavioral disturbance: Secondary | ICD-10-CM | POA: Diagnosis not present

## 2014-09-01 DIAGNOSIS — I10 Essential (primary) hypertension: Secondary | ICD-10-CM

## 2014-09-01 DIAGNOSIS — E039 Hypothyroidism, unspecified: Secondary | ICD-10-CM | POA: Diagnosis not present

## 2014-09-01 DIAGNOSIS — S0101XA Laceration without foreign body of scalp, initial encounter: Secondary | ICD-10-CM | POA: Insufficient documentation

## 2014-09-01 DIAGNOSIS — G309 Alzheimer's disease, unspecified: Secondary | ICD-10-CM

## 2014-09-01 LAB — BASIC METABOLIC PANEL
ANION GAP: 9 (ref 5–15)
BUN: 20 mg/dL (ref 6–20)
CO2: 28 mmol/L (ref 22–32)
CREATININE: 0.92 mg/dL (ref 0.44–1.00)
Calcium: 9.1 mg/dL (ref 8.9–10.3)
Chloride: 104 mmol/L (ref 101–111)
GFR calc non Af Amer: 54 mL/min — ABNORMAL LOW (ref >60)
Glucose, Bld: 97 mg/dL (ref 65–99)
POTASSIUM: 4.1 mmol/L (ref 3.5–5.1)
Sodium: 141 mmol/L (ref 135–145)

## 2014-09-01 LAB — MAGNESIUM: Magnesium: 2 mg/dL (ref 1.7–2.4)

## 2014-09-01 LAB — HEPATIC FUNCTION PANEL
ALT: 12 U/L — ABNORMAL LOW (ref 14–54)
AST: 20 U/L (ref 15–41)
Albumin: 3.2 g/dL — ABNORMAL LOW (ref 3.5–5.0)
Alkaline Phosphatase: 64 U/L (ref 38–126)
Bilirubin, Direct: <0.1 mg/dL — ABNORMAL LOW (ref 0.1–0.5)
Total Bilirubin: 0.7 mg/dL (ref 0.3–1.2)
Total Protein: 6.4 g/dL — ABNORMAL LOW (ref 6.5–8.1)

## 2014-09-01 MED ORDER — BACITRACIN ZINC 500 UNIT/GM EX OINT
TOPICAL_OINTMENT | Freq: Two times a day (BID) | CUTANEOUS | Status: DC
  Administered 2014-09-01: 12:00:00 via TOPICAL
  Filled 2014-09-01: qty 28.35

## 2014-09-01 MED ORDER — AMIODARONE HCL 200 MG PO TABS: 200.0000 mg | ORAL_TABLET | Freq: Two times a day (BID) | ORAL | Status: DC

## 2014-09-01 MED ORDER — TRAMADOL HCL 50 MG PO TABS: 25.0000 mg | ORAL_TABLET | Freq: Three times a day (TID) | ORAL | Status: DC | PRN

## 2014-09-01 NOTE — Progress Notes (Signed)
Subjective: Per night team, Tracy Reeves was pulling off her telemetry leads despite sitter and mittens and was given ativan 0.25 mg. Daughter was not at bedside this morning during rounds. Tracy Reeves had no complaints other than her head and neck aching s/p fall. She specifically denied any chest pain, SOB, palpitations, cough.  Objective: Vital signs in last 24 hours: Filed Vitals:   08/31/14 0833 08/31/14 0951 08/31/14 1057 09/01/14 1006  BP: 123/72 118/52 136/64 92/40  Pulse: 76 83 82   Temp:   98.6 F (37 C)   TempSrc:   Oral   Resp: 18 20 18    SpO2: 99% 96% 96%    Weight change:   Intake/Output Summary (Last 24 hours) at 09/01/14 1132 Last data filed at 09/01/14 0900  Gross per 24 hour  Intake    123 ml  Output      0 ml  Net    123 ml   General: laying in bed, chronically ill appearing HEENT: PERRL, EOMI, no scleral icterus, right parietal scalp laceration with staples Cardiac: RRR, no rubs, murmurs or gallops Pulm: bibasilar crackles, no respiratory distress Abd: soft, nontender, nondistended, BS present Ext: warm and well perfused, no pedal edema, right elbow with excoriations  Neuro: alert and oriented to self, cranial nerves II-XII grossly intact, 5/5 UE and LE strength, able to follow commands  Lab Results: Basic Metabolic Panel:  Recent Labs Lab 08/31/14 0705 08/31/14 1300 09/01/14 0416  NA 142  --  141  K 4.1  --  4.1  CL 105  --  104  CO2 28  --  28  GLUCOSE 103*  --  97  BUN 19  --  20  CREATININE 0.90 0.81 0.92  CALCIUM 9.2  --  9.1  MG  --  1.9 2.0   Liver Function Tests:  Recent Labs Lab 09/01/14 0416  AST 20  ALT 12*  ALKPHOS 64  BILITOT 0.7  PROT 6.4*  ALBUMIN 3.2*   CBC:  Recent Labs Lab 08/31/14 0705 08/31/14 1300  WBC 7.3 8.7  NEUTROABS 3.0  --   HGB 13.5 13.8  HCT 43.2 43.3  MCV 89.1 88.5  PLT 274 282   Cardiac Enzymes:  Recent Labs Lab 08/31/14 0705  TROPONINI <0.03   Thyroid Function Tests:  Recent  Labs Lab 08/31/14 1300  TSH 0.445     Micro Results: Recent Results (from the past 240 hour(s))  MRSA PCR Screening     Status: None   Collection Time: 08/31/14 11:19 AM  Result Value Ref Range Status   MRSA by PCR NEGATIVE NEGATIVE Final    Comment:        The GeneXpert MRSA Assay (FDA approved for NASAL specimens only), is one component of a comprehensive MRSA colonization surveillance program. It is not intended to diagnose MRSA infection nor to guide or monitor treatment for MRSA infections.    Studies/Results: Ct Head Wo Contrast  08/31/2014   CLINICAL DATA:  Nursing home patient fell, head laceration RIGHT scalp, history hypertension, Alzheimer's dementia  EXAM: CT HEAD WITHOUT CONTRAST  CT CERVICAL SPINE WITHOUT CONTRAST  TECHNIQUE: Multidetector CT imaging of the head and cervical spine was performed following the standard protocol without intravenous contrast. Multiplanar CT image reconstructions of the cervical spine were also generated.  COMPARISON:  None  FINDINGS: CT HEAD FINDINGS  Generalized atrophy.  Normal ventricular morphology.  No midline shift or mass effect.  Small vessel chronic ischemic changes of deep cerebral  white matter including pons.  No intracranial hemorrhage, mass lesion, or evidence of acute infarction.  No extra-axial fluid collections.  Small RIGHT parietal scalp hematoma.  Skull intact.  Tiny air-fluid level in sphenoid sinus appear.  Paranasal sinuses and mastoid air cells otherwise clear.  Extensive atherosclerotic calcifications of the internal carotid arteries bilaterally at the carotid siphons.  CT CERVICAL SPINE FINDINGS  Motion artifacts, for which repeat imaging was performed.  Disc space narrowing and endplate spur formation greatest at C6-C7.  Advanced multilevel facet degenerative changes with mild anterolisthesis at C4-C5 and C5-C6.  Vertebral body heights maintained without fracture or bone destruction.  Lung apices clear.  Prevertebral  soft tissues normal thickness.  Scattered atherosclerotic calcifications.  Diffuse enlargement of both thyroid lobes, multinodular in appearance.  IMPRESSION: Atrophy with small vessel chronic ischemic changes of deep cerebral white matter.  No acute intracranial abnormalities.  Multilevel degenerative disc and facet disease changes of the cervical spine as above.  No acute cervical spine abnormalities.  Extensive atherosclerotic calcifications.  Enlarged multinodular thyroid gland.   Electronically Signed   By: Lavonia Dana M.D.   On: 08/31/2014 08:22   Ct Cervical Spine Wo Contrast  08/31/2014   CLINICAL DATA:  Nursing home patient fell, head laceration RIGHT scalp, history hypertension, Alzheimer's dementia  EXAM: CT HEAD WITHOUT CONTRAST  CT CERVICAL SPINE WITHOUT CONTRAST  TECHNIQUE: Multidetector CT imaging of the head and cervical spine was performed following the standard protocol without intravenous contrast. Multiplanar CT image reconstructions of the cervical spine were also generated.  COMPARISON:  None  FINDINGS: CT HEAD FINDINGS  Generalized atrophy.  Normal ventricular morphology.  No midline shift or mass effect.  Small vessel chronic ischemic changes of deep cerebral white matter including pons.  No intracranial hemorrhage, mass lesion, or evidence of acute infarction.  No extra-axial fluid collections.  Small RIGHT parietal scalp hematoma.  Skull intact.  Tiny air-fluid level in sphenoid sinus appear.  Paranasal sinuses and mastoid air cells otherwise clear.  Extensive atherosclerotic calcifications of the internal carotid arteries bilaterally at the carotid siphons.  CT CERVICAL SPINE FINDINGS  Motion artifacts, for which repeat imaging was performed.  Disc space narrowing and endplate spur formation greatest at C6-C7.  Advanced multilevel facet degenerative changes with mild anterolisthesis at C4-C5 and C5-C6.  Vertebral body heights maintained without fracture or bone destruction.  Lung apices  clear.  Prevertebral soft tissues normal thickness.  Scattered atherosclerotic calcifications.  Diffuse enlargement of both thyroid lobes, multinodular in appearance.  IMPRESSION: Atrophy with small vessel chronic ischemic changes of deep cerebral white matter.  No acute intracranial abnormalities.  Multilevel degenerative disc and facet disease changes of the cervical spine as above.  No acute cervical spine abnormalities.  Extensive atherosclerotic calcifications.  Enlarged multinodular thyroid gland.   Electronically Signed   By: Lavonia Dana M.D.   On: 08/31/2014 08:22   Dg Chest Portable 1 View  08/31/2014   CLINICAL DATA:  Shortness of breath.  Recent fall.  EXAM: PORTABLE CHEST - 1 VIEW  COMPARISON:  None.  FINDINGS: Heart size is mildly enlarged. Both lungs are clear. No evidence of pneumothorax or pleural effusion.  IMPRESSION: Mild cardiomegaly.  No active lung disease.   Electronically Signed   By: Earle Gell M.D.   On: 08/31/2014 07:20   Medications: I have reviewed the patient's current medications. Scheduled Meds: . amiodarone  200 mg Oral BID  . amLODipine  5 mg Oral Daily  . bacitracin  Topical BID  . divalproex  125 mg Oral QHS  . enoxaparin (LOVENOX) injection  40 mg Subcutaneous Q24H  . levothyroxine  25 mcg Oral QAC breakfast  . losartan  50 mg Oral Daily  . PARoxetine  5 mg Oral q morning - 10a  . sodium chloride  3 mL Intravenous Q12H   Continuous Infusions:  PRN Meds:.LORazepam Assessment/Plan: Principal Problem:   Syncope and collapse Active Problems:   Nonsustained ventricular tachycardia  1. Syncopal episode 2/2 NSVT: Witnessed VT in ED is likely cause of syncope. Daughter who is HCPOA reiterated to me yesterday that patient does not want any invasive procedure and is DNR but would like medical management. She was seen by Dr Lovena Le of EP who agreed and offered amiodarone if daughter wanted which she did. She was started on amiodarone 200 mg bid per cardiology.  Baseline TSH and LFTs obtained. Per cards, goal of K>3.9 and Mg >1.8 which she is on initial and repeat BMPs. -appreciate cards -cont amiodarone 200 mg bid per cardiology and follow-up with them to reassess dosing -goal K>3.9, Mg >1.8  2. HTN: BP 110s-130s/50s-70s since admission while on losartan 50 mg daily and amlodipine 5 mg daily. -cont losartan 50 mg daily and amlodipine 5 mg daily  3. Hypothyroidism: TSH 0.44 on synthroid 25 mcg daily -cont synthroid 25 mcg daily  4. 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  -cont depakote 125 mg qhs and paxil 5 mg daily -try to avoid sedating medicine such as benzodiazapines  Dispo: today  The patient does have a current PCP (No primary care provider on file.) and does not need an [facility] follow-up appointment after discharge.  The patient does not know have transportation limitations that hinder transportation to clinic appointments.  .Services Needed at time of discharge: Y = Yes, Blank = No PT:   OT:   RN:   Equipment:   Other:       [name], MD 09/01/2014, 11:32 AM
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
## 2014-09-01 NOTE — Progress Notes (Signed)
Patient plans to DC back to ALF today, [facility]. Spoke with ALF with regards to transfer and they are agreeable. Asking for new FL2 and to be faxed in which LCSW will complete.    Patient daughter aware of transfer and agreeable. Will set up ambulance transport due to family unable to transport and facility.  No other needs at this time.    Lane Hacker, MSW Clinical Social Work: Emergency Room [phone]

## 2014-09-01 NOTE — Discharge Instructions (Signed)
It was a pleasure to care for you at [facility]. We have started a new medication called amiodarone 200 mg to be taken twice a day for your ventricular tachycardia. Please attend your follow-up appointment with cardiology and call Alvester Chou, your PCP on Monday to schedule follow-up within a week as they did not answer the phone on Friday afternoon. Please return to the Emergency Department or seek medical attention if you have any new or worsening chest pain, trouble breathing, or other worrisome medical condition.   Lottie Mussel, MD

## 2014-09-03 ENCOUNTER — Emergency Department (HOSPITAL_COMMUNITY): Payer: Medicare Other

## 2014-09-03 ENCOUNTER — Observation Stay (HOSPITAL_COMMUNITY)
Admission: EM | Admit: 2014-09-03 | Discharge: 2014-09-05 | Disposition: A | Discharge status: Skilled Nursing Facility | Payer: Medicare Other | Attending: Internal Medicine | Admitting: Internal Medicine

## 2014-09-03 ENCOUNTER — Encounter (HOSPITAL_COMMUNITY): Payer: Self-pay | Admitting: Emergency Medicine

## 2014-09-03 DIAGNOSIS — W07XXXA Fall from chair, initial encounter: Secondary | ICD-10-CM | POA: Insufficient documentation

## 2014-09-03 DIAGNOSIS — Y92129 Unspecified place in nursing home as the place of occurrence of the external cause: Secondary | ICD-10-CM | POA: Diagnosis not present

## 2014-09-03 DIAGNOSIS — F0390 Unspecified dementia without behavioral disturbance: Secondary | ICD-10-CM | POA: Diagnosis not present

## 2014-09-03 DIAGNOSIS — R296 Repeated falls: Secondary | ICD-10-CM | POA: Insufficient documentation

## 2014-09-03 DIAGNOSIS — S42212A Unspecified displaced fracture of surgical neck of left humerus, initial encounter for closed fracture: Principal | ICD-10-CM | POA: Insufficient documentation

## 2014-09-03 DIAGNOSIS — S42202A Unspecified fracture of upper end of left humerus, initial encounter for closed fracture: Secondary | ICD-10-CM

## 2014-09-03 DIAGNOSIS — D181 Lymphangioma, any site: Secondary | ICD-10-CM | POA: Diagnosis present

## 2014-09-03 DIAGNOSIS — S42213A Unspecified displaced fracture of surgical neck of unspecified humerus, initial encounter for closed fracture: Secondary | ICD-10-CM | POA: Diagnosis present

## 2014-09-03 DIAGNOSIS — I472 Ventricular tachycardia, unspecified: Secondary | ICD-10-CM

## 2014-09-03 DIAGNOSIS — G960 Cerebrospinal fluid leak: Secondary | ICD-10-CM

## 2014-09-03 DIAGNOSIS — E039 Hypothyroidism, unspecified: Secondary | ICD-10-CM | POA: Insufficient documentation

## 2014-09-03 DIAGNOSIS — G9608 Other cranial cerebrospinal fluid leak: Secondary | ICD-10-CM

## 2014-09-03 DIAGNOSIS — N289 Disorder of kidney and ureter, unspecified: Secondary | ICD-10-CM | POA: Diagnosis not present

## 2014-09-03 DIAGNOSIS — Z79899 Other long term (current) drug therapy: Secondary | ICD-10-CM | POA: Insufficient documentation

## 2014-09-03 DIAGNOSIS — W19XXXA Unspecified fall, initial encounter: Secondary | ICD-10-CM | POA: Diagnosis present

## 2014-09-03 DIAGNOSIS — Z91012 Allergy to eggs: Secondary | ICD-10-CM | POA: Diagnosis not present

## 2014-09-03 DIAGNOSIS — I10 Essential (primary) hypertension: Secondary | ICD-10-CM | POA: Insufficient documentation

## 2014-09-03 DIAGNOSIS — S069X9A Unspecified intracranial injury with loss of consciousness of unspecified duration, initial encounter: Secondary | ICD-10-CM | POA: Diagnosis present

## 2014-09-03 DIAGNOSIS — S42209A Unspecified fracture of upper end of unspecified humerus, initial encounter for closed fracture: Secondary | ICD-10-CM | POA: Insufficient documentation

## 2014-09-03 DIAGNOSIS — S069XAA Unspecified intracranial injury with loss of consciousness status unknown, initial encounter: Secondary | ICD-10-CM | POA: Diagnosis present

## 2014-09-03 DIAGNOSIS — S42309A Unspecified fracture of shaft of humerus, unspecified arm, initial encounter for closed fracture: Secondary | ICD-10-CM

## 2014-09-03 HISTORY — DX: Unspecified intracranial injury with loss of consciousness status unknown, initial encounter: S06.9XAA

## 2014-09-03 HISTORY — DX: Unspecified fracture of shaft of humerus, unspecified arm, initial encounter for closed fracture: S42.309A

## 2014-09-03 HISTORY — DX: Syncope and collapse: R55

## 2014-09-03 HISTORY — DX: Disorder of kidney and ureter, unspecified: N28.9

## 2014-09-03 HISTORY — DX: Unspecified intracranial injury with loss of consciousness of unspecified duration, initial encounter: S06.9X9A

## 2014-09-03 HISTORY — DX: Unspecified place in nursing home as the place of occurrence of the external cause: Y92.129

## 2014-09-03 HISTORY — DX: Unspecified fall, initial encounter: W19.XXXA

## 2014-09-03 LAB — CREATININE, SERUM
Creatinine, Ser: 1.05 mg/dL — ABNORMAL HIGH (ref 0.44–1.00)
GFR calc Af Amer: 53 mL/min — ABNORMAL LOW (ref >60)
GFR calc non Af Amer: 46 mL/min — ABNORMAL LOW (ref >60)

## 2014-09-03 LAB — CBC WITH DIFFERENTIAL/PLATELET
BASOS ABS: 0 10*3/uL (ref 0.0–0.1)
BASOS PCT: 0 % (ref 0–1)
EOS ABS: 0.1 10*3/uL (ref 0.0–0.7)
EOS PCT: 1 % (ref 0–5)
HCT: 40.4 % (ref 36.0–46.0)
Hemoglobin: 12.9 g/dL (ref 12.0–15.0)
LYMPHS ABS: 2.3 10*3/uL (ref 0.7–4.0)
Lymphocytes Relative: 28 % (ref 12–46)
MCH: 28.2 pg (ref 26.0–34.0)
MCHC: 31.9 g/dL (ref 30.0–36.0)
MCV: 88.2 fL (ref 78.0–100.0)
Monocytes Absolute: 0.6 10*3/uL (ref 0.1–1.0)
Monocytes Relative: 7 % (ref 3–12)
NEUTROS PCT: 64 % (ref 43–77)
Neutro Abs: 5.3 10*3/uL (ref 1.7–7.7)
PLATELETS: 251 10*3/uL (ref 150–400)
RBC: 4.58 MIL/uL (ref 3.87–5.11)
RDW: 14.4 % (ref 11.5–15.5)
WBC: 8.4 10*3/uL (ref 4.0–10.5)

## 2014-09-03 LAB — TYPE AND SCREEN
ABO/RH(D): O POS
Antibody Screen: NEGATIVE

## 2014-09-03 LAB — CK TOTAL AND CKMB (NOT AT ARMC)
CK, MB: 1.5 ng/mL (ref 0.5–5.0)
RELATIVE INDEX: INVALID (ref 0.0–2.5)
Total CK: 89 U/L (ref 38–234)

## 2014-09-03 LAB — CBC
HEMATOCRIT: 37.6 % (ref 36.0–46.0)
HEMOGLOBIN: 11.9 g/dL — AB (ref 12.0–15.0)
MCH: 28.1 pg (ref 26.0–34.0)
MCHC: 31.6 g/dL (ref 30.0–36.0)
MCV: 88.9 fL (ref 78.0–100.0)
PLATELETS: 249 10*3/uL (ref 150–400)
RBC: 4.23 MIL/uL (ref 3.87–5.11)
RDW: 14.4 % (ref 11.5–15.5)
WBC: 10 10*3/uL (ref 4.0–10.5)

## 2014-09-03 LAB — ABO/RH: ABO/RH(D): O POS

## 2014-09-03 LAB — BASIC METABOLIC PANEL
Anion gap: 12 (ref 5–15)
BUN: 37 mg/dL — ABNORMAL HIGH (ref 6–20)
CALCIUM: 8.6 mg/dL — AB (ref 8.9–10.3)
CHLORIDE: 104 mmol/L (ref 101–111)
CO2: 22 mmol/L (ref 22–32)
Creatinine, Ser: 1.37 mg/dL — ABNORMAL HIGH (ref 0.44–1.00)
GFR calc Af Amer: 38 mL/min — ABNORMAL LOW (ref >60)
GFR calc non Af Amer: 33 mL/min — ABNORMAL LOW (ref >60)
GLUCOSE: 154 mg/dL — AB (ref 65–99)
Potassium: 4.8 mmol/L (ref 3.5–5.1)
Sodium: 138 mmol/L (ref 135–145)

## 2014-09-03 LAB — PROTIME-INR
INR: 1.07 (ref 0.00–1.49)
Prothrombin Time: 14.1 seconds (ref 11.6–15.2)

## 2014-09-03 MED ORDER — ENOXAPARIN SODIUM 30 MG/0.3ML ~~LOC~~ SOLN: 30.0000 mg | SUBCUTANEOUS | Status: DC

## 2014-09-03 MED ORDER — ONDANSETRON HCL 4 MG PO TABS: 4.0000 mg | ORAL_TABLET | Freq: Four times a day (QID) | ORAL | Status: DC | PRN

## 2014-09-03 MED ORDER — POTASSIUM CHLORIDE IN NACL 20-0.9 MEQ/L-% IV SOLN
INTRAVENOUS | Status: AC
  Administered 2014-09-03: 13:00:00 via INTRAVENOUS
  Filled 2014-09-03: qty 1000

## 2014-09-03 MED ORDER — FENTANYL CITRATE (PF) 100 MCG/2ML IJ SOLN
50.0000 ug | INTRAMUSCULAR | Status: DC | PRN
  Administered 2014-09-03 – 2014-09-04 (×6): 50 ug via INTRAVENOUS
  Filled 2014-09-03 (×7): qty 2

## 2014-09-03 MED ORDER — ENOXAPARIN SODIUM 40 MG/0.4ML ~~LOC~~ SOLN: 40.0000 mg | SUBCUTANEOUS | Status: DC

## 2014-09-03 MED ORDER — ONDANSETRON HCL 4 MG/2ML IJ SOLN: 4.0000 mg | Freq: Four times a day (QID) | INTRAMUSCULAR | Status: DC | PRN

## 2014-09-03 MED ORDER — DIVALPROEX SODIUM 125 MG PO DR TAB
125.0000 mg | DELAYED_RELEASE_TABLET | Freq: Every day | ORAL | Status: DC
  Administered 2014-09-03 – 2014-09-04 (×2): 125 mg via ORAL
  Filled 2014-09-03 (×5): qty 1

## 2014-09-03 MED ORDER — SODIUM CHLORIDE 0.9 % IJ SOLN: 3.0000 mL | Freq: Two times a day (BID) | INTRAMUSCULAR | Status: DC

## 2014-09-03 MED ORDER — FENTANYL CITRATE (PF) 100 MCG/2ML IJ SOLN
50.0000 ug | Freq: Once | INTRAMUSCULAR | Status: AC
  Administered 2014-09-03: 50 ug via INTRAVENOUS
  Filled 2014-09-03: qty 2

## 2014-09-03 MED ORDER — LEVOTHYROXINE SODIUM 25 MCG PO TABS
25.0000 ug | ORAL_TABLET | Freq: Every day | ORAL | Status: DC
  Administered 2014-09-04 – 2014-09-05 (×2): 25 ug via ORAL
  Filled 2014-09-03 (×2): qty 1

## 2014-09-03 MED ORDER — PAROXETINE HCL 10 MG PO TABS
5.0000 mg | ORAL_TABLET | Freq: Every morning | ORAL | Status: DC
  Administered 2014-09-04 – 2014-09-05 (×2): 5 mg via ORAL
  Filled 2014-09-03 (×3): qty 0.5

## 2014-09-03 MED ORDER — AMIODARONE HCL 100 MG PO TABS
200.0000 mg | ORAL_TABLET | Freq: Two times a day (BID) | ORAL | Status: DC
  Administered 2014-09-03 – 2014-09-05 (×4): 200 mg via ORAL
  Filled 2014-09-03 (×4): qty 2

## 2014-09-03 NOTE — ED Provider Notes (Signed)
CSN: [id number]     Arrival date & time 09/03/14  0554 History   First MD Initiated Contact with Patient 09/03/14 [phone]     Chief Complaint  Patient presents with  . Fall     (Consider location/radiation/quality/duration/timing/severity/associated sxs/prior Treatment) HPI  Level V caveat- AMS CODE STATUS: DNR   PCP: [name], MD Blood pressure 125/56, pulse 80, temperature 97.6 F (36.4 C), temperature source Oral, resp. rate 20, height 5\' 3"  (1.6 m), weight 130 lb (58.968 kg), SpO2 98 %.  Tracy Reeves is a 81 y.o.female with a significant PMH of hypertension, shingles, thyroid and dementia  presents to the ER with complaints of unwitnessed fall. It is unclear as to why the patient fell- she comes by EMS from a nursing home facility. The patient is unable to tell me how the fall happened. She reports having pain to her left arm. She was seen in the ER a few days ago and admitted for overnight observation- during this stay she had an episode of V-tach which is what they believe is the cause of her frequent falls. The patient denies CP or SOB. Per the daughter who is now present the patient is different from her baseline being slightly more confused. The patient is awake, alert and oriented at this time.  The patient denies having any symptoms of headache, weakness, change in vision.      Past Medical History  Diagnosis Date  . Hypertension   . Shingles   . Thyroid disease   . Dementia   . Thyroid activity decreased    Past Surgical History  Procedure Laterality Date  . Abdominal hysterectomy    . Cataract extraction     No family history on file. History  Substance Use Topics  . Smoking status: Never Smoker   . Smokeless tobacco: Never Used  . Alcohol Use: No   OB History    No data available     Review of Systems  Level V caveat- AMS  Allergies  Eggs or egg-derived products  Home Medications   Prior to Admission medications   Medication Sig Start  Date End Date Taking? Authorizing Provider  amiodarone (PACERONE) 200 MG tablet Take 1 tablet (200 mg total) by mouth 2 (two) times daily. 09/01/14  Yes [name], MD  amLODipine (NORVASC) 5 MG tablet Take 5 mg by mouth daily.   Yes Historical Provider, MD  divalproex (DEPAKOTE) 125 MG DR tablet Take 125 mg by mouth at bedtime.   Yes Historical Provider, MD  levothyroxine (SYNTHROID, LEVOTHROID) 25 MCG tablet Take 25 mcg by mouth daily before breakfast.   Yes Historical Provider, MD  losartan (COZAAR) 50 MG tablet Take 50 mg by mouth daily.   Yes Historical Provider, MD  PARoxetine (PAXIL) 10 MG tablet Take 5 mg by mouth every morning.    Yes Historical Provider, MD  saccharomyces boulardii (FLORASTOR) 250 MG capsule Take 250 mg by mouth 2 (two) times daily.   Yes Historical Provider, MD  traMADol (ULTRAM) 50 MG tablet Take 0.5 tablets (25 mg total) by mouth 3 (three) times daily as needed (for pain). 09/01/14  Yes [name], MD   BP 113/63 mmHg  Pulse 115  Temp(Src) 97.6 F (36.4 C) (Oral)  Resp 20  Ht 5\' 3"  (1.6 m)  Wt 130 lb (58.968 kg)  BMI 23.03 kg/m2  SpO2 97% Physical Exam  Constitutional: She appears well-developed and well-nourished. She appears distressed (severe pain to left shoulder).  Cervical collar and backboard in place.  HENT:  Head: Normocephalic. Head is without raccoon's eyes, without Battle's sign, without abrasion, without contusion, without laceration, without right periorbital erythema and without left periorbital erythema.  Right Ear: Tympanic membrane and ear canal normal.  Left Ear: Tympanic membrane and ear canal normal.  Nose: Nose normal.  Mouth/Throat: Uvula is midline and oropharynx is clear and moist.  Eyes: Conjunctivae and EOM are normal. Pupils are equal, round, and reactive to light. No scleral icterus.  Neck: Normal range of motion. Neck supple. No spinous process tenderness and no muscular tenderness present.  Cardiovascular: Normal rate,  regular rhythm, normal heart sounds and intact distal pulses.   Pulmonary/Chest: Effort normal. No respiratory distress. She has no wheezes. She has no rales. She exhibits no tenderness.  Abdominal: Soft. Bowel sounds are normal. There is no tenderness. There is no rebound and no guarding.  Musculoskeletal: She exhibits no edema.       Right shoulder: Normal.       Left shoulder: She exhibits decreased range of motion, tenderness, bony tenderness, swelling, crepitus and pain. She exhibits no effusion, no deformity, no laceration, no spasm, normal pulse and normal strength.       Right hip: Normal.       Left hip: Normal.  Physiologic grip strengths bilaterally.  Neurological: She is alert. She displays a negative Romberg sign.  No facial paralysis or slurring of speech, patient is alert and oriented to self. Pt answers questions appropriately.   Skin: Skin is warm and dry. No petechiae, no purpura and no rash noted. She is not diaphoretic.  Psychiatric: Her speech is not slurred. Cognition and memory are impaired.  Nursing note and vitals reviewed.   ED Course  Procedures (including critical care time) Labs Review Labs Reviewed  BASIC METABOLIC PANEL - Abnormal; Notable for the following:    Glucose, Bld 154 (*)    BUN 37 (*)    Creatinine, Ser 1.37 (*)    Calcium 8.6 (*)    GFR calc non Af Amer 33 (*)    GFR calc Af Amer 38 (*)    All other components within normal limits  CBC WITH DIFFERENTIAL/PLATELET  PROTIME-INR  CK TOTAL AND CKMB (NOT AT [facility])  TYPE AND SCREEN  ABO/RH    Imaging Review Dg Chest 1 View  09/03/2014   CLINICAL DATA:  Fall in nursing home. Left humerus fracture. Initial encounter.  EXAM: CHEST  1 VIEW  COMPARISON:  08/31/2014  FINDINGS: Acute and displaced surgical neck fracture of the left humerus. Reference dedicated imaging. Additional fracture is identified.  Cardiomegaly is stable, as are aortic and hilar contours. Hypoventilation without evidence of  contusion, hemothorax, or pneumothorax.  IMPRESSION: 1. No acute cardiopulmonary findings. 2. Displaced left humerus surgical neck fracture.   Electronically Signed   By: Monte Fantasia M.D.   On: 09/03/2014 07:34   Dg Elbow Complete Left  09/03/2014   CLINICAL DATA:  Fall in nursing home. Limited elbow positioning due to ipsilateral left humerus fracture. Initial encounter.  EXAM: LEFT ELBOW - COMPLETE 3+ VIEW  COMPARISON:  None.  FINDINGS: Limited lateral imaging, nondiagnostic for evaluating joint fluid. There is no visible fracture or dislocation. Mild degenerative spurring about the elbow with lateral epicondylar enthesopathy.  IMPRESSION: 1. No acute findings. 2. Limited lateral view due to ipsilateral humerus fracture and difficulty with positioning. If ongoing concern for elbow pathology, repeat lateral view recommended when clinically able.   Electronically Signed  By: Monte Fantasia M.D.   On: 09/03/2014 07:33   Ct Head Wo Contrast  09/03/2014   CLINICAL DATA:  Fall this morning with left head injury headache and neck pain.  EXAM: CT HEAD WITHOUT CONTRAST  CT CERVICAL SPINE WITHOUT CONTRAST  TECHNIQUE: Multidetector CT imaging of the head and cervical spine was performed following the standard protocol without intravenous contrast. Multiplanar CT image reconstructions of the cervical spine were also generated.  COMPARISON:  Head CT from 3 days ago  FINDINGS: CT HEAD FINDINGS  Skull and Sinuses:Decreased swelling at the right parietal scalp hematoma/ laceration.  No evidence of acute fracture  Orbits: Right cataract resection.  No traumatic findings.  Brain: There are low-density subdural collections around the bilateral cerebral convexities which are new from previous, up to 6 mm on the right and 9 mm on the left. Would expect some remaining high density if these were subacute hematomas, and the appearance is consistent with hygromas. These cause mild crowding of sulci compared to previous, but no  shift or herniation. No high-density/acute hemorrhage.  No evidence of acute infarct or hydrocephalus. Temporal predominant brain atrophy with temporal horn dilatation. In this patient with history of dementia, this usually reflects Alzheimer's disease. Gliosis in the lateral left temporal lobe and at the left temporal occipital junction, likely remote infarcts. Chronic small-vessel disease with ischemic gliosis mainly in the deep cerebral white matter.  CT CERVICAL SPINE FINDINGS  No evidence of acute fracture or traumatic malalignment. Mild anterolisthesis at C4-5, C5-6, and T1-T2 is stable from previous and related to severe facet arthropathy with bulky overgrowth, especially on the right. There is no gross cervical canal hematoma or prevertebral edema. Extradural posterior high-density at the level of C5-6 is ligamentum flavum hypertrophy with associated degenerative gas containing cyst.  Multi nodular goiter without airway compromise.  These results were called by telephone at the time of interpretation on 09/03/2014 at 7:54 am to Dr. Carlota Raspberry, who verbally acknowledged these results.  IMPRESSION: 1. New hygromas around the bilateral cerebral convexities. The collections measure up to 9 mm and do not cause significant mass effect. 2. No evidence of acute cervical spine injury. 3. Senescent and degenerative changes are noted above.   Electronically Signed   By: Monte Fantasia M.D.   On: 09/03/2014 07:58   Ct Cervical Spine Wo Contrast  09/03/2014   CLINICAL DATA:  Fall this morning with left head injury headache and neck pain.  EXAM: CT HEAD WITHOUT CONTRAST  CT CERVICAL SPINE WITHOUT CONTRAST  TECHNIQUE: Multidetector CT imaging of the head and cervical spine was performed following the standard protocol without intravenous contrast. Multiplanar CT image reconstructions of the cervical spine were also generated.  COMPARISON:  Head CT from 3 days ago  FINDINGS: CT HEAD FINDINGS  Skull and Sinuses:Decreased  swelling at the right parietal scalp hematoma/ laceration.  No evidence of acute fracture  Orbits: Right cataract resection.  No traumatic findings.  Brain: There are low-density subdural collections around the bilateral cerebral convexities which are new from previous, up to 6 mm on the right and 9 mm on the left. Would expect some remaining high density if these were subacute hematomas, and the appearance is consistent with hygromas. These cause mild crowding of sulci compared to previous, but no shift or herniation. No high-density/acute hemorrhage.  No evidence of acute infarct or hydrocephalus. Temporal predominant brain atrophy with temporal horn dilatation. In this patient with history of dementia, this usually reflects Alzheimer's disease. Gliosis  in the lateral left temporal lobe and at the left temporal occipital junction, likely remote infarcts. Chronic small-vessel disease with ischemic gliosis mainly in the deep cerebral white matter.  CT CERVICAL SPINE FINDINGS  No evidence of acute fracture or traumatic malalignment. Mild anterolisthesis at C4-5, C5-6, and T1-T2 is stable from previous and related to severe facet arthropathy with bulky overgrowth, especially on the right. There is no gross cervical canal hematoma or prevertebral edema. Extradural posterior high-density at the level of C5-6 is ligamentum flavum hypertrophy with associated degenerative gas containing cyst.  Multi nodular goiter without airway compromise.  These results were called by telephone at the time of interpretation on 09/03/2014 at 7:54 am to Dr. Carlota Raspberry, who verbally acknowledged these results.  IMPRESSION: 1. New hygromas around the bilateral cerebral convexities. The collections measure up to 9 mm and do not cause significant mass effect. 2. No evidence of acute cervical spine injury. 3. Senescent and degenerative changes are noted above.   Electronically Signed   By: Monte Fantasia M.D.   On: 09/03/2014 07:58   Dg Shoulder  Left  09/03/2014   CLINICAL DATA:  Fall.  Initial encounter.  EXAM: LEFT SHOULDER - 2+ VIEW  COMPARISON:  None.  FINDINGS: There is a transverse fracture through the surgical neck of the humerus with 50% medial and anterior displacement. There may be continuation into the greater tuberosity, but no noted displacement at this level. Located glenohumeral and acromioclavicular joints.  Moderate acromioclavicular osteoarthritis with inferior spurring. No notable glenohumeral arthritis.  Rotator cuff calcific tendinopathy.  IMPRESSION: Displaced surgical neck humerus fracture.   Electronically Signed   By: Monte Fantasia M.D.   On: 09/03/2014 07:30     EKG Interpretation   Date/Time:  Monday September 03 2014 06:05:17 EDT Ventricular Rate:  78 PR Interval:  168 QRS Duration: 96 QT Interval:  428 QTC Calculation: 487 R Axis:   33 Text Interpretation:  Sinus rhythm Borderline prolonged QT interval  Artifact When compared with ECG of 10/20/2011 QT has lengthened Confirmed  by [facility]  MD, Nunzio Cory [phone]) on 09/03/2014 6:28:47 AM      MDM   Final diagnoses:  Fall  Proximal humeral fracture, left, closed, initial encounter  Hygroma  Renal insufficiency    Dr. Percell Miller (Ortho)- shoulder sling for left humeral neck fracture. He will see today or tomorrow. Dr. Trenton Gammon (Neurosurgery)- Has agreed to consult regarding the Hygromas, no recommendations at this time. Internal Medicine Residents have agreed to admit patient, Temp admit orders placed for Tele. Medications  fentaNYL (SUBLIMAZE) injection 50 mcg (not administered)  fentaNYL (SUBLIMAZE) injection 50 mcg (50 mcg Intravenous Given 09/03/14 0749)    Patient is awake and alert, Dr. Canary Brim aware of patients findings and plan for neurosurg, ortho and internal medicine calls. Daughter who is present made aware of findings and advised that patient will be admitted and specialist will consult.     Delos Haring, PA-C 09/03/14 [place], MD 09/03/14 1001

## 2014-09-03 NOTE — ED Notes (Signed)
Daughter at bedside.

## 2014-09-03 NOTE — H&P (Signed)
Date: 09/03/2014               Patient Name:  Tracy Reeves MRN: [id number]  DOB: February 06, 1923 Age / Sex: 81 y.o., female   PCP: [name], MD         Medical Service: Internal Medicine Teaching Service         Attending Physician: Dr. Oval Linsey, MD    First Contact: Dr. Ethelene Hal Pager: [phone]  Second Contact: Dr. Gordy Levan Pager: [phone]       After Hours (After 5p/  First Contact Pager: [phone]  weekends / holidays): Second Contact Pager: [phone]   Chief Complaint: Fall  History of Present Illness: Ms. Cena an 81 yo woman pmh recent admission for [place], [place] dementia complicated by TBI causing memory care residence at [facility], HTN, and hypothyroidism presents again after another fall. Pt is accompanied by her daughter and HCPOA and describe that this event was with the patient sitting in a chair and then suddenly blacked out and fell out of the chair onto the floor hitting her left shoulder. The patient didn't describe having any pain or have any symptoms to her aide whom was with her at the time. Again no seizure like activity and the tech described the patient to be in her normal state of health and the event happened suddenly and not associated ith any activity such as toileting or eating, event was w/o any urinary or stool incontinence, vomiting, tongue biting, or shaking/seizure like activity. A medication log sheet was available with the patient and it is clear pt has been taking amiodarone. No arrhythmic events seen in the ED. HCPOA is worried if [facility] is still appropriate for the patient given the high frequency of recent falls.   Meds: Current Facility-Administered Medications  Medication Dose Route Frequency Provider Last Rate Last Dose  . fentaNYL (SUBLIMAZE) injection 50 mcg  50 mcg Intravenous Q1H PRN Delos Haring, PA-C       Current Outpatient Prescriptions  Medication Sig Dispense Refill  . amiodarone (PACERONE) 200 MG tablet Take 1 tablet (200  mg total) by mouth 2 (two) times daily. 60 tablet 0  . amLODipine (NORVASC) 5 MG tablet Take 5 mg by mouth daily.    . divalproex (DEPAKOTE) 125 MG DR tablet Take 125 mg by mouth at bedtime.    Marland Kitchen levothyroxine (SYNTHROID, LEVOTHROID) 25 MCG tablet Take 25 mcg by mouth daily before breakfast.    . losartan (COZAAR) 50 MG tablet Take 50 mg by mouth daily.    Marland Kitchen PARoxetine (PAXIL) 10 MG tablet Take 5 mg by mouth every morning.     . saccharomyces boulardii (FLORASTOR) 250 MG capsule Take 250 mg by mouth 2 (two) times daily.    . traMADol (ULTRAM) 50 MG tablet Take 0.5 tablets (25 mg total) by mouth 3 (three) times daily as needed (for pain). 12 tablet 0    Allergies: Allergies as of 09/03/2014 - Review Complete 09/03/2014  Allergen Reaction Noted  . Eggs or egg-derived products  08/31/2014   Past Medical History  Diagnosis Date  . Hypertension   . Shingles   . Thyroid disease   . Dementia   . Thyroid activity decreased    Past Surgical History  Procedure Laterality Date  . Abdominal hysterectomy    . Cataract extraction     No family history on file. History   Social History  . Marital Status: Unknown    Spouse Name: N/A  . Number of  Children: N/A  . Years of Education: N/A   Occupational History  . Not on file.   Social History Main Topics  . Smoking status: Never Smoker   . Smokeless tobacco: Never Used  . Alcohol Use: No  . Drug Use: No  . Sexual Activity: Not on file   Other Topics Concern  . Not on file   Social History Narrative    Review of Systems: Review of systems not obtained due to patient factors.  Physical Exam: Blood pressure 109/53, pulse 80, temperature 97.6 F (36.4 C), temperature source Oral, resp. rate 20, height 5\' 3"  (1.6 m), weight 130 lb (58.968 kg), SpO2 98 %. General: resting in bed, uncomfortable HEENT: pupils pinpoint, PERRL,no scleral icterus, very dry MM with cracking of lips Cardiac: RRR, no rubs, murmurs or gallops Pulm: clear  to auscultation bilaterally, no crackles wheezes or rhonchi and moving normal volumes of air Abd: soft, nontender, nondistended, BS present Ext: warm and well perfused, no pedal edema, left shoulder in a sling, multiple ecchymosis along both sides of body, well healing parietal lobe laceration with staples in place no active bleeding, good hand grip bilaterally 5/5 somewhat limited by pain Neuro: alert and oriented to self, follows simple commands but word salad and doesn't answer questions appropriately   Lab results: Basic Metabolic Panel:  Recent Labs  08/31/14 1300 09/01/14 0416 09/03/14 0649  NA  --  141 138  K  --  4.1 4.8  CL  --  104 104  CO2  --  28 22  GLUCOSE  --  97 154*  BUN  --  20 37*  CREATININE 0.81 0.92 1.37*  CALCIUM  --  9.1 8.6*  MG 1.9 2.0  --    Liver Function Tests:  Recent Labs  09/01/14 0416  AST 20  ALT 12*  ALKPHOS 64  BILITOT 0.7  PROT 6.4*  ALBUMIN 3.2*   CBC:  Recent Labs  08/31/14 1300 09/03/14 0649  WBC 8.7 8.4  NEUTROABS  --  5.3  HGB 13.8 12.9  HCT 43.3 40.4  MCV 88.5 88.2  PLT 282 251    Recent Labs  08/31/14 1300  TSH 0.445   Coagulation:  Recent Labs  09/03/14 0649  LABPROT 14.1  INR 1.07   Imaging results:  Dg Chest 1 View  09/03/2014   CLINICAL DATA:  Fall in nursing home. Left humerus fracture. Initial encounter.  EXAM: CHEST  1 VIEW  COMPARISON:  08/31/2014  FINDINGS: Acute and displaced surgical neck fracture of the left humerus. Reference dedicated imaging. Additional fracture is identified.  Cardiomegaly is stable, as are aortic and hilar contours. Hypoventilation without evidence of contusion, hemothorax, or pneumothorax.  IMPRESSION: 1. No acute cardiopulmonary findings. 2. Displaced left humerus surgical neck fracture.   Electronically Signed   By: Monte Fantasia M.D.   On: 09/03/2014 07:34   Dg Elbow Complete Left  09/03/2014   CLINICAL DATA:  Fall in nursing home. Limited elbow positioning due to  ipsilateral left humerus fracture. Initial encounter.  EXAM: LEFT ELBOW - COMPLETE 3+ VIEW  COMPARISON:  None.  FINDINGS: Limited lateral imaging, nondiagnostic for evaluating joint fluid. There is no visible fracture or dislocation. Mild degenerative spurring about the elbow with lateral epicondylar enthesopathy.  IMPRESSION: 1. No acute findings. 2. Limited lateral view due to ipsilateral humerus fracture and difficulty with positioning. If ongoing concern for elbow pathology, repeat lateral view recommended when clinically able.   Electronically Signed   By:  Monte Fantasia M.D.   On: 09/03/2014 07:33   Ct Head Wo Contrast  09/03/2014   CLINICAL DATA:  Fall this morning with left head injury headache and neck pain.  EXAM: CT HEAD WITHOUT CONTRAST  CT CERVICAL SPINE WITHOUT CONTRAST  TECHNIQUE: Multidetector CT imaging of the head and cervical spine was performed following the standard protocol without intravenous contrast. Multiplanar CT image reconstructions of the cervical spine were also generated.  COMPARISON:  Head CT from 3 days ago  FINDINGS: CT HEAD FINDINGS  Skull and Sinuses:Decreased swelling at the right parietal scalp hematoma/ laceration.  No evidence of acute fracture  Orbits: Right cataract resection.  No traumatic findings.  Brain: There are low-density subdural collections around the bilateral cerebral convexities which are new from previous, up to 6 mm on the right and 9 mm on the left. Would expect some remaining high density if these were subacute hematomas, and the appearance is consistent with hygromas. These cause mild crowding of sulci compared to previous, but no shift or herniation. No high-density/acute hemorrhage.  No evidence of acute infarct or hydrocephalus. Temporal predominant brain atrophy with temporal horn dilatation. In this patient with history of dementia, this usually reflects Alzheimer's disease. Gliosis in the lateral left temporal lobe and at the left temporal  occipital junction, likely remote infarcts. Chronic small-vessel disease with ischemic gliosis mainly in the deep cerebral white matter.  CT CERVICAL SPINE FINDINGS  No evidence of acute fracture or traumatic malalignment. Mild anterolisthesis at C4-5, C5-6, and T1-T2 is stable from previous and related to severe facet arthropathy with bulky overgrowth, especially on the right. There is no gross cervical canal hematoma or prevertebral edema. Extradural posterior high-density at the level of C5-6 is ligamentum flavum hypertrophy with associated degenerative gas containing cyst.  Multi nodular goiter without airway compromise.  These results were called by telephone at the time of interpretation on 09/03/2014 at 7:54 am to Dr. Carlota Raspberry, who verbally acknowledged these results.  IMPRESSION: 1. New hygromas around the bilateral cerebral convexities. The collections measure up to 9 mm and do not cause significant mass effect. 2. No evidence of acute cervical spine injury. 3. Senescent and degenerative changes are noted above.   Electronically Signed   By: Monte Fantasia M.D.   On: 09/03/2014 07:58   Ct Cervical Spine Wo Contrast  09/03/2014   CLINICAL DATA:  Fall this morning with left head injury headache and neck pain.  EXAM: CT HEAD WITHOUT CONTRAST  CT CERVICAL SPINE WITHOUT CONTRAST  TECHNIQUE: Multidetector CT imaging of the head and cervical spine was performed following the standard protocol without intravenous contrast. Multiplanar CT image reconstructions of the cervical spine were also generated.  COMPARISON:  Head CT from 3 days ago  FINDINGS: CT HEAD FINDINGS  Skull and Sinuses:Decreased swelling at the right parietal scalp hematoma/ laceration.  No evidence of acute fracture  Orbits: Right cataract resection.  No traumatic findings.  Brain: There are low-density subdural collections around the bilateral cerebral convexities which are new from previous, up to 6 mm on the right and 9 mm on the left. Would  expect some remaining high density if these were subacute hematomas, and the appearance is consistent with hygromas. These cause mild crowding of sulci compared to previous, but no shift or herniation. No high-density/acute hemorrhage.  No evidence of acute infarct or hydrocephalus. Temporal predominant brain atrophy with temporal horn dilatation. In this patient with history of dementia, this usually reflects Alzheimer's disease. Gliosis in  the lateral left temporal lobe and at the left temporal occipital junction, likely remote infarcts. Chronic small-vessel disease with ischemic gliosis mainly in the deep cerebral white matter.  CT CERVICAL SPINE FINDINGS  No evidence of acute fracture or traumatic malalignment. Mild anterolisthesis at C4-5, C5-6, and T1-T2 is stable from previous and related to severe facet arthropathy with bulky overgrowth, especially on the right. There is no gross cervical canal hematoma or prevertebral edema. Extradural posterior high-density at the level of C5-6 is ligamentum flavum hypertrophy with associated degenerative gas containing cyst.  Multi nodular goiter without airway compromise.  These results were called by telephone at the time of interpretation on 09/03/2014 at 7:54 am to Dr. Carlota Raspberry, who verbally acknowledged these results.  IMPRESSION: 1. New hygromas around the bilateral cerebral convexities. The collections measure up to 9 mm and do not cause significant mass effect. 2. No evidence of acute cervical spine injury. 3. Senescent and degenerative changes are noted above.   Electronically Signed   By: Monte Fantasia M.D.   On: 09/03/2014 07:58   Dg Shoulder Left  09/03/2014   CLINICAL DATA:  Fall.  Initial encounter.  EXAM: LEFT SHOULDER - 2+ VIEW  COMPARISON:  None.  FINDINGS: There is a transverse fracture through the surgical neck of the humerus with 50% medial and anterior displacement. There may be continuation into the greater tuberosity, but no noted displacement at  this level. Located glenohumeral and acromioclavicular joints.  Moderate acromioclavicular osteoarthritis with inferior spurring. No notable glenohumeral arthritis.  Rotator cuff calcific tendinopathy.  IMPRESSION: Displaced surgical neck humerus fracture.   Electronically Signed   By: Monte Fantasia M.D.   On: 09/03/2014 07:30    Other results: EKG: normal EKG, normal sinus rhythm, unchanged from previous tracings.  Assessment & Plan by Problem: 1. Fall complicated by left shoulder fracture and new hygromas: pt again suffered what appears to be a syncopal event and likely 2/2 previously diagnosed Ventricular arrhythmia.  Pt was taking amiodarone but that may not be effective in this case. No seizure like activity or focal neurological deficits to suggest stroke or other neurological pathology.GOC discussion was had with HCPOA with the following conclusions: pt is to remain DNR/DNI, no AICD, the patient would not want any neurological surgical procedure BUT would be amenable if deemed a surgical candidate to undergo orthopedic procedures to minimize pain with the main goal of being pain control. At this time honoring of the patient's previously known wishes a trial of medical management for her Tanna Furry had been pursued and ortho is awaiting recommendations by neurosurgery.  -admit to tele -if further management is needed may need to [facility] cardiology   2. Displaced left surgical neck humerus fracture: This is a consequence of the patient's syncopal episode. Images reviewed by ortho.  -ortho consult -pt in sling currently -cont pain management   3. Acute Hygromas: pt has had a hx of subdural hematoma and subarachnoid hemorrhage back in 2/16 when pt suffered severe fall. It is unclear given these are new between CT head of 09/01/14 and 09/03/14 if these are in the setting of recent head trauma. On imaging there doesn't appear to be any significant mass effect but it is also hard to fully understand  any potential change in mental status and the patient limited ability to participate in exam to be confident no changes have occurred.  neurosurgery consulted -serial neuro exams  -NPO tentative surgical evaluations  4. AKI: Cr 1.3 when previoulsy normal on 6/11.  Pt clinically dry. Will hold ARB at this time.  -NS+ 20 mEq KCL @ 100 x 12 hrs and then re-eval  5. HTN: pt normotensive. Clinically dry and AKI will hold meds.  -cont to monitor can add back amlodipine   6. Hypothyroidism: most recent TSH on 09/01/14 was normal. Will continue synthroid  7. Dementia complicated by recent TBI: worse today per HCPOA will continue depakote and paxil. Would avoid benzos.  -consult to social work if considering different NH placement option  Dispo: Disposition is deferred at this time, awaiting improvement of current medical problems. Anticipated discharge in approximately 1-2 day(s).   The patient does have a current PCP ([name], MD) and does need an [facility] hospital follow-up appointment after discharge.  The patient does not have transportation limitations that hinder transportation to clinic appointments.  Signed: Jerrye Noble, MD 09/03/2014, 8:58 AM

## 2014-09-03 NOTE — Consult Note (Signed)
Reason for Consult: Bilateral subdural hygromas Referring Physician: Joe Tanney is an 81 y.o. female.  HPI: 81 year old female with significant dementia and prior history of traumatic brain injury suffered a fall today with resultant head injury and left shoulder injury. No documented loss of consciousness. No seizure activity. Patient confused and agitated. This apparently is close to her baseline.  Past Medical History  Diagnosis Date  . Hypertension   . Shingles   . Thyroid disease   . Dementia   . Thyroid activity decreased   . Fall at nursing home   . Syncope   . Humeral fracture 09/03/2014  . Renal insufficiency   . TBI (traumatic brain injury)     Past Surgical History  Procedure Laterality Date  . Abdominal hysterectomy    . Cataract extraction      History reviewed. No pertinent family history.  Social History:  reports that she has never smoked. She has never used smokeless tobacco. She reports that she does not drink alcohol or use illicit drugs.  Allergies:  Allergies  Allergen Reactions  . Eggs Or Egg-Derived Products     Unknown reaction    Medications: I have reviewed the patient's current medications.  Results for orders placed or performed during the hospital encounter of 09/03/14 (from the past 48 hour(s))  Basic metabolic panel     Status: Abnormal   Collection Time: 09/03/14  6:49 AM  Result Value Ref Range   Sodium 138 135 - 145 mmol/L   Potassium 4.8 3.5 - 5.1 mmol/L    Comment: SPECIMEN HEMOLYZED. HEMOLYSIS MAY AFFECT INTEGRITY OF RESULTS.   Chloride 104 101 - 111 mmol/L   CO2 22 22 - 32 mmol/L   Glucose, Bld 154 (H) 65 - 99 mg/dL   BUN 37 (H) 6 - 20 mg/dL   Creatinine, Ser 1.37 (H) 0.44 - 1.00 mg/dL   Calcium 8.6 (L) 8.9 - 10.3 mg/dL   GFR calc non Af Amer 33 (L) >60 mL/min   GFR calc Af Amer 38 (L) >60 mL/min    Comment: (NOTE) The eGFR has been calculated using the CKD EPI equation. This calculation has not been validated in  all clinical situations. eGFR's persistently <60 mL/min signify possible Chronic Kidney Disease.    Anion gap 12 5 - 15  CBC WITH DIFFERENTIAL     Status: None   Collection Time: 09/03/14  6:49 AM  Result Value Ref Range   WBC 8.4 4.0 - 10.5 K/uL   RBC 4.58 3.87 - 5.11 MIL/uL   Hemoglobin 12.9 12.0 - 15.0 g/dL   HCT 40.4 36.0 - 46.0 %   MCV 88.2 78.0 - 100.0 fL   MCH 28.2 26.0 - 34.0 pg   MCHC 31.9 30.0 - 36.0 g/dL   RDW 14.4 11.5 - 15.5 %   Platelets 251 150 - 400 K/uL   Neutrophils Relative % 64 43 - 77 %   Neutro Abs 5.3 1.7 - 7.7 K/uL   Lymphocytes Relative 28 12 - 46 %   Lymphs Abs 2.3 0.7 - 4.0 K/uL   Monocytes Relative 7 3 - 12 %   Monocytes Absolute 0.6 0.1 - 1.0 K/uL   Eosinophils Relative 1 0 - 5 %   Eosinophils Absolute 0.1 0.0 - 0.7 K/uL   Basophils Relative 0 0 - 1 %   Basophils Absolute 0.0 0.0 - 0.1 K/uL  Protime-INR     Status: None   Collection Time: 09/03/14  6:49 AM  Result Value Ref Range   Prothrombin Time 14.1 11.6 - 15.2 seconds   INR 1.07 0.00 - 1.49  Type and screen     Status: None   Collection Time: 09/03/14  6:49 AM  Result Value Ref Range   ABO/RH(D) O POS    Antibody Screen NEG    Sample Expiration 09/06/2014   ABO/Rh     Status: None   Collection Time: 09/03/14  6:49 AM  Result Value Ref Range   ABO/RH(D) O POS   CK total and CKMB (cardiac)not at [facility]     Status: None   Collection Time: 09/03/14  6:49 AM  Result Value Ref Range   Total CK 89 38 - 234 U/L   CK, MB 1.5 0.5 - 5.0 ng/mL   Relative Index RELATIVE INDEX IS INVALID 0.0 - 2.5    Comment: WHEN CK < 100 U/L          CBC     Status: Abnormal   Collection Time: 09/03/14 12:10 PM  Result Value Ref Range   WBC 10.0 4.0 - 10.5 K/uL   RBC 4.23 3.87 - 5.11 MIL/uL   Hemoglobin 11.9 (L) 12.0 - 15.0 g/dL   HCT 37.6 36.0 - 46.0 %   MCV 88.9 78.0 - 100.0 fL   MCH 28.1 26.0 - 34.0 pg   MCHC 31.6 30.0 - 36.0 g/dL   RDW 14.4 11.5 - 15.5 %   Platelets 249 150 - 400 K/uL  Creatinine,  serum     Status: Abnormal   Collection Time: 09/03/14 12:10 PM  Result Value Ref Range   Creatinine, Ser 1.05 (H) 0.44 - 1.00 mg/dL   GFR calc non Af Amer 46 (L) >60 mL/min   GFR calc Af Amer 53 (L) >60 mL/min    Comment: (NOTE) The eGFR has been calculated using the CKD EPI equation. This calculation has not been validated in all clinical situations. eGFR's persistently <60 mL/min signify possible Chronic Kidney Disease.     Dg Chest 1 View  09/03/2014   CLINICAL DATA:  Fall in nursing home. Left humerus fracture. Initial encounter.  EXAM: CHEST  1 VIEW  COMPARISON:  08/31/2014  FINDINGS: Acute and displaced surgical neck fracture of the left humerus. Reference dedicated imaging. Additional fracture is identified.  Cardiomegaly is stable, as are aortic and hilar contours. Hypoventilation without evidence of contusion, hemothorax, or pneumothorax.  IMPRESSION: 1. No acute cardiopulmonary findings. 2. Displaced left humerus surgical neck fracture.   Electronically Signed   By: Monte Fantasia M.D.   On: 09/03/2014 07:34   Dg Elbow Complete Left  09/03/2014   CLINICAL DATA:  Fall in nursing home. Limited elbow positioning due to ipsilateral left humerus fracture. Initial encounter.  EXAM: LEFT ELBOW - COMPLETE 3+ VIEW  COMPARISON:  None.  FINDINGS: Limited lateral imaging, nondiagnostic for evaluating joint fluid. There is no visible fracture or dislocation. Mild degenerative spurring about the elbow with lateral epicondylar enthesopathy.  IMPRESSION: 1. No acute findings. 2. Limited lateral view due to ipsilateral humerus fracture and difficulty with positioning. If ongoing concern for elbow pathology, repeat lateral view recommended when clinically able.   Electronically Signed   By: Monte Fantasia M.D.   On: 09/03/2014 07:33   Ct Head Wo Contrast  09/03/2014   CLINICAL DATA:  Fall this morning with left head injury headache and neck pain.  EXAM: CT HEAD WITHOUT CONTRAST  CT CERVICAL SPINE  WITHOUT CONTRAST  TECHNIQUE: Multidetector CT imaging of  the head and cervical spine was performed following the standard protocol without intravenous contrast. Multiplanar CT image reconstructions of the cervical spine were also generated.  COMPARISON:  Head CT from 3 days ago  FINDINGS: CT HEAD FINDINGS  Skull and Sinuses:Decreased swelling at the right parietal scalp hematoma/ laceration.  No evidence of acute fracture  Orbits: Right cataract resection.  No traumatic findings.  Brain: There are low-density subdural collections around the bilateral cerebral convexities which are new from previous, up to 6 mm on the right and 9 mm on the left. Would expect some remaining high density if these were subacute hematomas, and the appearance is consistent with hygromas. These cause mild crowding of sulci compared to previous, but no shift or herniation. No high-density/acute hemorrhage.  No evidence of acute infarct or hydrocephalus. Temporal predominant brain atrophy with temporal horn dilatation. In this patient with history of dementia, this usually reflects Alzheimer's disease. Gliosis in the lateral left temporal lobe and at the left temporal occipital junction, likely remote infarcts. Chronic small-vessel disease with ischemic gliosis mainly in the deep cerebral white matter.  CT CERVICAL SPINE FINDINGS  No evidence of acute fracture or traumatic malalignment. Mild anterolisthesis at C4-5, C5-6, and T1-T2 is stable from previous and related to severe facet arthropathy with bulky overgrowth, especially on the right. There is no gross cervical canal hematoma or prevertebral edema. Extradural posterior high-density at the level of C5-6 is ligamentum flavum hypertrophy with associated degenerative gas containing cyst.  Multi nodular goiter without airway compromise.  These results were called by telephone at the time of interpretation on 09/03/2014 at 7:54 am to Dr. Carlota Raspberry, who verbally acknowledged these results.   IMPRESSION: 1. New hygromas around the bilateral cerebral convexities. The collections measure up to 9 mm and do not cause significant mass effect. 2. No evidence of acute cervical spine injury. 3. Senescent and degenerative changes are noted above.   Electronically Signed   By: Monte Fantasia M.D.   On: 09/03/2014 07:58   Ct Cervical Spine Wo Contrast  09/03/2014   CLINICAL DATA:  Fall this morning with left head injury headache and neck pain.  EXAM: CT HEAD WITHOUT CONTRAST  CT CERVICAL SPINE WITHOUT CONTRAST  TECHNIQUE: Multidetector CT imaging of the head and cervical spine was performed following the standard protocol without intravenous contrast. Multiplanar CT image reconstructions of the cervical spine were also generated.  COMPARISON:  Head CT from 3 days ago  FINDINGS: CT HEAD FINDINGS  Skull and Sinuses:Decreased swelling at the right parietal scalp hematoma/ laceration.  No evidence of acute fracture  Orbits: Right cataract resection.  No traumatic findings.  Brain: There are low-density subdural collections around the bilateral cerebral convexities which are new from previous, up to 6 mm on the right and 9 mm on the left. Would expect some remaining high density if these were subacute hematomas, and the appearance is consistent with hygromas. These cause mild crowding of sulci compared to previous, but no shift or herniation. No high-density/acute hemorrhage.  No evidence of acute infarct or hydrocephalus. Temporal predominant brain atrophy with temporal horn dilatation. In this patient with history of dementia, this usually reflects Alzheimer's disease. Gliosis in the lateral left temporal lobe and at the left temporal occipital junction, likely remote infarcts. Chronic small-vessel disease with ischemic gliosis mainly in the deep cerebral white matter.  CT CERVICAL SPINE FINDINGS  No evidence of acute fracture or traumatic malalignment. Mild anterolisthesis at C4-5, C5-6, and T1-T2 is stable from  previous and related to severe facet arthropathy with bulky overgrowth, especially on the right. There is no gross cervical canal hematoma or prevertebral edema. Extradural posterior high-density at the level of C5-6 is ligamentum flavum hypertrophy with associated degenerative gas containing cyst.  Multi nodular goiter without airway compromise.  These results were called by telephone at the time of interpretation on 09/03/2014 at 7:54 am to Dr. Carlota Raspberry, who verbally acknowledged these results.  IMPRESSION: 1. New hygromas around the bilateral cerebral convexities. The collections measure up to 9 mm and do not cause significant mass effect. 2. No evidence of acute cervical spine injury. 3. Senescent and degenerative changes are noted above.   Electronically Signed   By: Monte Fantasia M.D.   On: 09/03/2014 07:58   Dg Shoulder Left  09/03/2014   CLINICAL DATA:  Fall.  Initial encounter.  EXAM: LEFT SHOULDER - 2+ VIEW  COMPARISON:  None.  FINDINGS: There is a transverse fracture through the surgical neck of the humerus with 50% medial and anterior displacement. There may be continuation into the greater tuberosity, but no noted displacement at this level. Located glenohumeral and acromioclavicular joints.  Moderate acromioclavicular osteoarthritis with inferior spurring. No notable glenohumeral arthritis.  Rotator cuff calcific tendinopathy.  IMPRESSION: Displaced surgical neck humerus fracture.   Electronically Signed   By: Monte Fantasia M.D.   On: 09/03/2014 07:30    Review of systems not obtained due to patient factors. Blood pressure 141/82, pulse 80, temperature 98.8 F (37.1 C), temperature source Oral, resp. rate 18, height 5' 3"  (1.6 m), weight 58.968 kg (130 lb), SpO2 95 %. The patient is awake and aware. She is agitated. She is minimally verbal with incoherent speech. Cranial nerve function appears intact. Moving all 4 extremities and will occasionally follow commands very and pupils are equal  bilaterally. Gaze conjugate. Extraocular was full. Examination head ears eyes and throat demonstrates no evidence of static and laceration or bony abnormality.  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 Unlikely to cause significant mass effect. Certainly not likely to require any type of surgical intervention. I do not see any need for re-imaging particularly given her functional status. Please call me if her situation worsens.  Birch Farino A 09/03/2014, 10:19 PM

## 2014-09-03 NOTE — ED Notes (Signed)
[facility] pt fell at [employer]. Facility states she did not lose consciousness but did have a "fixed stare" for no longer than a minute. Pt does have hx of dementia. Pt daughter says she is acting confused, pt not at normal baseline.

## 2014-09-03 NOTE — Consult Note (Signed)
ORTHOPAEDIC CONSULTATION  REQUESTING PHYSICIAN: Oval Linsey, MD  Chief Complaint: Left shoulder pain  HPI: Tracy Reeves is a 81 y.o. female who complains of  Left shoulder pain after a fall from a chair this morning.  The patients daughter describes that the patient was sitting in a chair when she blacked out and fell to the floor onto the left shoulder.  The patient was recently admitted for [facility] and another fall.  The patient lives at [facility] care home, and they report that she has been receiving amiodarone as prescribed. The patient is DNR/DNI but HCPOA is agreeable to surgical intervention for the purpose of pain control, if deemed necessary from ortho standpoint. Xrays reveal a displaced left surgical neck humerus fracture.   Past Medical History  Diagnosis Date  . Hypertension   . Shingles   . Thyroid disease   . Dementia   . Thyroid activity decreased   . Fall at nursing home   . Syncope   . Humeral fracture 09/03/2014  . Renal insufficiency   . TBI (traumatic brain injury)    Past Surgical History  Procedure Laterality Date  . Abdominal hysterectomy    . Cataract extraction     History   Social History  . Marital Status: Unknown    Spouse Name: N/A  . Number of Children: N/A  . Years of Education: N/A   Social History Main Topics  . Smoking status: Never Smoker   . Smokeless tobacco: Never Used  . Alcohol Use: No  . Drug Use: No  . Sexual Activity: Not on file   Other Topics Concern  . None   Social History Narrative   History reviewed. No pertinent family history. Allergies  Allergen Reactions  . Eggs Or Egg-Derived Products     Unknown reaction   Prior to Admission medications   Medication Sig Start Date End Date Taking? Authorizing Provider  amiodarone (PACERONE) 200 MG tablet Take 1 tablet (200 mg total) by mouth 2 (two) times daily. 09/01/14  Yes [name], MD  amLODipine (NORVASC) 5 MG tablet Take 5 mg by mouth daily.   Yes  Historical Provider, MD  divalproex (DEPAKOTE) 125 MG DR tablet Take 125 mg by mouth at bedtime.   Yes Historical Provider, MD  levothyroxine (SYNTHROID, LEVOTHROID) 25 MCG tablet Take 25 mcg by mouth daily before breakfast.   Yes Historical Provider, MD  losartan (COZAAR) 50 MG tablet Take 50 mg by mouth daily.   Yes Historical Provider, MD  PARoxetine (PAXIL) 10 MG tablet Take 5 mg by mouth every morning.    Yes Historical Provider, MD  saccharomyces boulardii (FLORASTOR) 250 MG capsule Take 250 mg by mouth 2 (two) times daily.   Yes Historical Provider, MD  traMADol (ULTRAM) 50 MG tablet Take 0.5 tablets (25 mg total) by mouth 3 (three) times daily as needed (for pain). 09/01/14  Yes [name], MD   Dg Chest 1 View  09/03/2014   CLINICAL DATA:  Fall in nursing home. Left humerus fracture. Initial encounter.  EXAM: CHEST  1 VIEW  COMPARISON:  08/31/2014  FINDINGS: Acute and displaced surgical neck fracture of the left humerus. Reference dedicated imaging. Additional fracture is identified.  Cardiomegaly is stable, as are aortic and hilar contours. Hypoventilation without evidence of contusion, hemothorax, or pneumothorax.  IMPRESSION: 1. No acute cardiopulmonary findings. 2. Displaced left humerus surgical neck fracture.   Electronically Signed   By: Monte Fantasia M.D.   On:  09/03/2014 07:34   Dg Elbow Complete Left  09/03/2014   CLINICAL DATA:  Fall in nursing home. Limited elbow positioning due to ipsilateral left humerus fracture. Initial encounter.  EXAM: LEFT ELBOW - COMPLETE 3+ VIEW  COMPARISON:  None.  FINDINGS: Limited lateral imaging, nondiagnostic for evaluating joint fluid. There is no visible fracture or dislocation. Mild degenerative spurring about the elbow with lateral epicondylar enthesopathy.  IMPRESSION: 1. No acute findings. 2. Limited lateral view due to ipsilateral humerus fracture and difficulty with positioning. If ongoing concern for elbow pathology, repeat lateral view  recommended when clinically able.   Electronically Signed   By: Monte Fantasia M.D.   On: 09/03/2014 07:33   Ct Head Wo Contrast  09/03/2014   CLINICAL DATA:  Fall this morning with left head injury headache and neck pain.  EXAM: CT HEAD WITHOUT CONTRAST  CT CERVICAL SPINE WITHOUT CONTRAST  TECHNIQUE: Multidetector CT imaging of the head and cervical spine was performed following the standard protocol without intravenous contrast. Multiplanar CT image reconstructions of the cervical spine were also generated.  COMPARISON:  Head CT from 3 days ago  FINDINGS: CT HEAD FINDINGS  Skull and Sinuses:Decreased swelling at the right parietal scalp hematoma/ laceration.  No evidence of acute fracture  Orbits: Right cataract resection.  No traumatic findings.  Brain: There are low-density subdural collections around the bilateral cerebral convexities which are new from previous, up to 6 mm on the right and 9 mm on the left. Would expect some remaining high density if these were subacute hematomas, and the appearance is consistent with hygromas. These cause mild crowding of sulci compared to previous, but no shift or herniation. No high-density/acute hemorrhage.  No evidence of acute infarct or hydrocephalus. Temporal predominant brain atrophy with temporal horn dilatation. In this patient with history of dementia, this usually reflects Alzheimer's disease. Gliosis in the lateral left temporal lobe and at the left temporal occipital junction, likely remote infarcts. Chronic small-vessel disease with ischemic gliosis mainly in the deep cerebral white matter.  CT CERVICAL SPINE FINDINGS  No evidence of acute fracture or traumatic malalignment. Mild anterolisthesis at C4-5, C5-6, and T1-T2 is stable from previous and related to severe facet arthropathy with bulky overgrowth, especially on the right. There is no gross cervical canal hematoma or prevertebral edema. Extradural posterior high-density at the level of C5-6 is  ligamentum flavum hypertrophy with associated degenerative gas containing cyst.  Multi nodular goiter without airway compromise.  These results were called by telephone at the time of interpretation on 09/03/2014 at 7:54 am to Dr. Carlota Raspberry, who verbally acknowledged these results.  IMPRESSION: 1. New hygromas around the bilateral cerebral convexities. The collections measure up to 9 mm and do not cause significant mass effect. 2. No evidence of acute cervical spine injury. 3. Senescent and degenerative changes are noted above.   Electronically Signed   By: Monte Fantasia M.D.   On: 09/03/2014 07:58   Ct Cervical Spine Wo Contrast  09/03/2014   CLINICAL DATA:  Fall this morning with left head injury headache and neck pain.  EXAM: CT HEAD WITHOUT CONTRAST  CT CERVICAL SPINE WITHOUT CONTRAST  TECHNIQUE: Multidetector CT imaging of the head and cervical spine was performed following the standard protocol without intravenous contrast. Multiplanar CT image reconstructions of the cervical spine were also generated.  COMPARISON:  Head CT from 3 days ago  FINDINGS: CT HEAD FINDINGS  Skull and Sinuses:Decreased swelling at the right parietal scalp hematoma/ laceration.  No  evidence of acute fracture  Orbits: Right cataract resection.  No traumatic findings.  Brain: There are low-density subdural collections around the bilateral cerebral convexities which are new from previous, up to 6 mm on the right and 9 mm on the left. Would expect some remaining high density if these were subacute hematomas, and the appearance is consistent with hygromas. These cause mild crowding of sulci compared to previous, but no shift or herniation. No high-density/acute hemorrhage.  No evidence of acute infarct or hydrocephalus. Temporal predominant brain atrophy with temporal horn dilatation. In this patient with history of dementia, this usually reflects Alzheimer's disease. Gliosis in the lateral left temporal lobe and at the left temporal  occipital junction, likely remote infarcts. Chronic small-vessel disease with ischemic gliosis mainly in the deep cerebral white matter.  CT CERVICAL SPINE FINDINGS  No evidence of acute fracture or traumatic malalignment. Mild anterolisthesis at C4-5, C5-6, and T1-T2 is stable from previous and related to severe facet arthropathy with bulky overgrowth, especially on the right. There is no gross cervical canal hematoma or prevertebral edema. Extradural posterior high-density at the level of C5-6 is ligamentum flavum hypertrophy with associated degenerative gas containing cyst.  Multi nodular goiter without airway compromise.  These results were called by telephone at the time of interpretation on 09/03/2014 at 7:54 am to Dr. Carlota Raspberry, who verbally acknowledged these results.  IMPRESSION: 1. New hygromas around the bilateral cerebral convexities. The collections measure up to 9 mm and do not cause significant mass effect. 2. No evidence of acute cervical spine injury. 3. Senescent and degenerative changes are noted above.   Electronically Signed   By: Monte Fantasia M.D.   On: 09/03/2014 07:58   Dg Shoulder Left  09/03/2014   CLINICAL DATA:  Fall.  Initial encounter.  EXAM: LEFT SHOULDER - 2+ VIEW  COMPARISON:  None.  FINDINGS: There is a transverse fracture through the surgical neck of the humerus with 50% medial and anterior displacement. There may be continuation into the greater tuberosity, but no noted displacement at this level. Located glenohumeral and acromioclavicular joints.  Moderate acromioclavicular osteoarthritis with inferior spurring. No notable glenohumeral arthritis.  Rotator cuff calcific tendinopathy.  IMPRESSION: Displaced surgical neck humerus fracture.   Electronically Signed   By: Monte Fantasia M.D.   On: 09/03/2014 07:30    Positive ROS: All other systems have been reviewed and were otherwise negative with the exception of those mentioned in the HPI and as above.  Labs cbc  Recent  Labs  08/31/14 1300 09/03/14 0649  WBC 8.7 8.4  HGB 13.8 12.9  HCT 43.3 40.4  PLT 282 251    Labs inflam No results for input(s): CRP in the last 72 hours.  Invalid input(s): ESR  Labs coag  Recent Labs  09/03/14 0649  INR 1.07     Recent Labs  09/01/14 0416 09/03/14 0649  NA 141 138  K 4.1 4.8  CL 104 104  CO2 28 22  GLUCOSE 97 154*  BUN 20 37*  CREATININE 0.92 1.37*  CALCIUM 9.1 8.6*    Physical Exam: Filed Vitals:   09/03/14 1026  BP: 123/64  Pulse: 82  Temp:   Resp: 18   General: confused due to dementia Cardiovascular: No pedal edema Respiratory: No cyanosis, no use of accessory musculature GI: No organomegaly, abdomen is soft and non-tender Skin: No lesions in the area of chief complaint other than those listed below in MSK exam.  Neurologic: Sensation intact distally Lymphatic: No  axillary or cervical lymphadenopathy  MUSCULOSKELETAL:  Mild swelling and ecchymosis of the left shoulder, no open wounds or tenting of the skin, tender to palpation over the fracture site, sensation intact with 2+ distal pulses  Other extremities are atraumatic with painless ROM and NVI.  Assessment: Left displaced fracture of the surgical neck of the humerus  Plan: Reviewed the xrays with the daughter and patient.  Main concern is pain control.  We will proceed with non-operative management of the fracture.  Will plan on having in sling for comfort while admitted and then transition to a hanging arm cast when we see in the outpatient clinic. Will be NWB in the LUE for 4-6 weeks.  Patient does not need to be on bedrest from our perspective.  PT/OT can be started once bedrest restriction lifted. We will follow on an outpatient basis.  The patient can be discharged from ortho standpoint once cleared by medicine and neuro.   Weight Bearing Status: NWB in the LUE PT/OT VTE px: SCD's and chemical prophylaxis per medicine team   Gae Dry, PA-C Cell [phone]   09/03/2014 11:26 AM

## 2014-09-03 NOTE — Progress Notes (Signed)
Restless and agitated. Have received multiple calls from telemetry d/t poor telemetry transmission. Continuously searching with her Rt hand: Continues to remove leads for telemetry, attempting to throw telemetry box [this RN continues to attempt to hide box under pillow/ blanket/ gown pocket [out of site] and with her one free Rt hand follows the leads to the box or searches until she finds the box. LUE immobile in sling - with her Rt hand - she attempts to remove sling until the pulling or yanking causes pain to her shoulder. Hand mitt applied to Rt hand - uses her teeth, bites and immediately removes the mit. Daughter at the bedside attempting to reassure/ provide comfort.

## 2014-09-03 NOTE — Progress Notes (Signed)
7:49 PM  Zofran discontinued due to QT prolongation.

## 2014-09-04 DIAGNOSIS — I10 Essential (primary) hypertension: Secondary | ICD-10-CM

## 2014-09-04 DIAGNOSIS — Z66 Do not resuscitate: Secondary | ICD-10-CM

## 2014-09-04 DIAGNOSIS — S42209A Unspecified fracture of upper end of unspecified humerus, initial encounter for closed fracture: Secondary | ICD-10-CM | POA: Insufficient documentation

## 2014-09-04 DIAGNOSIS — W19XXXA Unspecified fall, initial encounter: Secondary | ICD-10-CM | POA: Diagnosis not present

## 2014-09-04 DIAGNOSIS — Z87820 Personal history of traumatic brain injury: Secondary | ICD-10-CM

## 2014-09-04 DIAGNOSIS — Y92129 Unspecified place in nursing home as the place of occurrence of the external cause: Secondary | ICD-10-CM

## 2014-09-04 DIAGNOSIS — S42212A Unspecified displaced fracture of surgical neck of left humerus, initial encounter for closed fracture: Secondary | ICD-10-CM

## 2014-09-04 DIAGNOSIS — D181 Lymphangioma, any site: Secondary | ICD-10-CM | POA: Insufficient documentation

## 2014-09-04 DIAGNOSIS — F0390 Unspecified dementia without behavioral disturbance: Secondary | ICD-10-CM

## 2014-09-04 DIAGNOSIS — Z9181 History of falling: Secondary | ICD-10-CM

## 2014-09-04 DIAGNOSIS — I472 Ventricular tachycardia: Secondary | ICD-10-CM

## 2014-09-04 DIAGNOSIS — E039 Hypothyroidism, unspecified: Secondary | ICD-10-CM

## 2014-09-04 DIAGNOSIS — S42309A Unspecified fracture of shaft of humerus, unspecified arm, initial encounter for closed fracture: Secondary | ICD-10-CM | POA: Diagnosis present

## 2014-09-04 MED ORDER — AMLODIPINE BESYLATE 5 MG PO TABS
5.0000 mg | ORAL_TABLET | Freq: Every day | ORAL | Status: DC
  Administered 2014-09-04: 5 mg via ORAL
  Filled 2014-09-04: qty 1

## 2014-09-04 MED ORDER — POTASSIUM CHLORIDE IN NACL 20-0.9 MEQ/L-% IV SOLN
INTRAVENOUS | Status: DC
  Administered 2014-09-04 – 2014-09-05 (×2): via INTRAVENOUS

## 2014-09-04 MED ORDER — ACETAMINOPHEN 325 MG PO TABS: 650.0000 mg | ORAL_TABLET | Freq: Four times a day (QID) | ORAL | Status: DC | PRN

## 2014-09-04 MED ORDER — ACETAMINOPHEN 325 MG PO TABS: 650.0000 mg | ORAL_TABLET | ORAL | Status: DC

## 2014-09-04 MED ORDER — OXYCODONE-ACETAMINOPHEN 5-325 MG PO TABS
1.0000 | ORAL_TABLET | ORAL | Status: DC | PRN
  Administered 2014-09-04 – 2014-09-05 (×3): 1 via ORAL
  Filled 2014-09-04 (×3): qty 1

## 2014-09-04 NOTE — Evaluation (Addendum)
Occupational Therapy Evaluation Patient Details Name: Tracy Reeves MRN: [id number] DOB: May 24, 1922 Today's Date: 09/04/2014    History of Present Illness Pt is a 81 y.o. F s/p fall at home w/ resultant L humeral fx and B subdural hygromas.  Pt has h/o falls w/ recent hospitalization 2/2 subdural hematoma and subarachnoid hemorrhage back in 2/16 2/2 severe fall.  Pt's additional PMH includes HTN, dementia, syncope.   Clinical Impression   Pt admitted with above. No family present in session. Recommending SNF for d/c. Feel pt will benefit from acute OT to address LUE and to increase independence prior to d/c.     Follow Up Recommendations  SNF;Supervision/Assistance - 24 hour    Equipment Recommendations  Other (comment) (defer to next venue)    Recommendations for Other Services       Precautions / Restrictions Precautions Precautions: Fall;Shoulder Type of Shoulder Precautions: no movement of shoulder Shoulder Interventions:  (educated on NWB/not moving shoulder) Required Braces or Orthoses: Sling (at all times) Restrictions Weight Bearing Restrictions: Yes LUE Weight Bearing: Non weight bearing      Mobility Bed Mobility  General bed mobility comments: not assessed  Transfers Overall transfer level: Needs assistance Transfers: Sit to/from Stand Sit to Stand: Min guard            Balance Balance not formally assessed. Hand held assist/also used IV pole for ambulation.                       ADL Overall ADL's : Needs assistance/impaired Eating/Feeding: Set up;Sitting;Supervision/ safety   Grooming: Set up;Supervision/safety;Sitting;Wash/dry face   Upper Body Bathing: Sitting;Moderate assistance   Lower Body Bathing: Minimal assistance;Sit to/from stand   Upper Body Dressing : Maximal assistance;Sitting;Maximal assistance   Lower Body Dressing: Moderate assistance;Sit to/from stand   Toilet Transfer: Minimal assistance;Ambulation (hand held  assist; also used IV pole)   Toileting- Clothing Manipulation and Hygiene: Minimal assistance;Sit to/from stand       Functional mobility during ADLs: Minimal assistance (hand held assist/also used IV pole) General ADL Comments: OT adjusted pt's sling. Cues for shoulder precautions in session. Pt ate/drank a little breakfast in session.      Vision     Perception     Praxis      Pertinent Vitals/Pain Pain Assessment: Faces Faces Pain Scale: Hurts little more Pain Location: LUE Pain Descriptors / Indicators:  (reported pain) Pain Intervention(s): Repositioned;Monitored during session     Hand Dominance     Extremity/Trunk Assessment Upper Extremity Assessment Upper Extremity Assessment: LUE deficits/detail LUE Deficits / Details: pt able to move hand/digits; pt in sling   Lower Extremity Assessment Lower Extremity Assessment: Defer to PT evaluation      Communication Communication Communication: No difficulties   Cognition Arousal/Alertness: Awake/alert Behavior During Therapy: WFL for tasks assessed/performed Overall Cognitive Status: History of cognitive impairments - at baseline                 General Comments          Shoulder Instructions      Home Living Family/patient expects to be discharged to:: Unsure                                 Additional Comments: No family present      Prior Functioning/Environment Level of Independence: Needs assistance        Comments: No family available to  provide information.  Pt from memory care unit PTA w/ unknown level of assist.    OT Diagnosis: Generalized weakness;Acute pain;Cognitive deficits   OT Problem List: Decreased strength;Pain;Impaired UE functional use;Decreased knowledge of precautions;Decreased knowledge of use of DME or AE;Decreased safety awareness;Decreased cognition;Impaired balance (sitting and/or standing)   OT Treatment/Interventions: Self-care/ADL training;DME  and/or AE instruction;Therapeutic exercise;Therapeutic activities;Cognitive remediation/compensation;Patient/family education;Balance training    OT Goals(Current goals can be found in the care plan section) Acute Rehab OT Goals Patient Stated Goal: none stated OT Goal Formulation: Patient unable to participate in goal setting Time For Goal Achievement: 09/11/14 Potential to Achieve Goals: Fair ADL Goals Pt Will Transfer to Toilet: with supervision;ambulating Additional ADL Goal #1: Pt will perform HEP for LUE (elbow, wrist, hand) at supervision level.  Additional ADL Goal #2: Caregiver will be independent in assisting pt with ADLs, while maintaining shoulder precautions.  OT Frequency: Min 2X/week   Barriers to D/C:            Co-evaluation              End of Session Equipment Utilized During Treatment: Gait belt;Other (comment) (sling)  Activity Tolerance: Patient tolerated treatment well Patient left: in chair;with call bell/phone within reach;with chair alarm set   Time: 5038-8828 OT Time Calculation (min): 15 min Charges:  OT General Charges $OT Visit: 1 Procedure OT Evaluation $Initial OT Evaluation Tier I: 1 Procedure G-Codes: OT G-codes **NOT FOR INPATIENT CLASS** Functional Assessment Tool Used: clinical judgment Functional Limitation: Self care Self Care Current Status (M0349): At least 20 percent but less than 40 percent impaired, limited or restricted Self Care Goal Status (Z7915): At least 1 percent but less than 20 percent impaired, limited or restricted  Benito Mccreedy OTR/L [phone] 09/04/2014, 11:28 AM

## 2014-09-04 NOTE — H&P (Signed)
Internal Medicine Attending Admission Note Date: 09/04/2014  Patient name: Grayling record number: [id number] Date of birth: Aug 06, 1922 Age: 81 y.o. Gender: female  I saw and evaluated the patient. I reviewed the resident's note and I agree with the resident's findings and plan as documented in the resident's note.  Chief Complaint(s): Fall with left shoulder pain.  History - key components related to admission:  Ms. Yager is an 81 year old woman with traumatic brain injury, ventricular tachycardia, hypertension, and hypothyroidism who had an acute fall in the nursing home presumably secondary to an episode of V. tach. She apparently was sitting in a chair and lost consciousness, fell and hit her left shoulder. In the emergency department she was found to have an acute humeral fracture near the left shoulder as well as 2 new hygromas likely related to a previous fall. She was therefore admitted to the internal medicine teaching service for further evaluation and care. Discussions with her durable power of Atty. for healthcare reveals the desire for conservative pain management if successful over invasive therapies for her hygromas, left humeral fracture, and ventricular tachycardia.  When seen on rounds this morning Ms. Stucker was very comfortable and denied any pain on her narcotic pain control.  Physical Exam - key components related to admission:  Filed Vitals:   09/03/14 1026 09/03/14 1403 09/03/14 2020 09/04/14 0416  BP: 123/64 128/61 141/82 153/66  Pulse: 82 80  77  Temp:  98.1 F (36.7 C) 98.8 F (37.1 C) 97.5 F (36.4 C)  TempSrc:   Oral   Resp: 18 18 18 17   Height:      Weight:      SpO2: 98% 93% 95% 93%   Gen.: Well-developed, well-nourished, woman sitting comfortably in a chair eating breakfast in no acute distress. Lungs: Clear to auscultation bilaterally without wheezes, rhonchi, or rales. Heart: Regular rate and rhythm without murmurs, rubs, or  gallops. Extremities: Left arm in sling.  Lab results:  Basic Metabolic Panel:  Recent Labs  09/03/14 0649 09/03/14 1210  NA 138  --   K 4.8  --   CL 104  --   CO2 22  --   GLUCOSE 154*  --   BUN 37*  --   CREATININE 1.37* 1.05*  CALCIUM 8.6*  --    CBC:  Recent Labs  09/03/14 0649 09/03/14 1210  WBC 8.4 10.0  NEUTROABS 5.3  --   HGB 12.9 11.9*  HCT 40.4 37.6  MCV 88.2 88.9  PLT 251 249   Cardiac Enzymes:  Recent Labs  09/03/14 0649  CKTOTAL 89  CKMB 1.5   Coagulation:  Recent Labs  09/03/14 0649  INR 1.07   Imaging results:  Dg Chest 1 View  09/03/2014   CLINICAL DATA:  Fall in nursing home. Left humerus fracture. Initial encounter.  EXAM: CHEST  1 VIEW  COMPARISON:  08/31/2014  FINDINGS: Acute and displaced surgical neck fracture of the left humerus. Reference dedicated imaging. Additional fracture is identified.  Cardiomegaly is stable, as are aortic and hilar contours. Hypoventilation without evidence of contusion, hemothorax, or pneumothorax.  IMPRESSION: 1. No acute cardiopulmonary findings. 2. Displaced left humerus surgical neck fracture.   Electronically Signed   By: Monte Fantasia M.D.   On: 09/03/2014 07:34   Dg Elbow Complete Left  09/03/2014   CLINICAL DATA:  Fall in nursing home. Limited elbow positioning due to ipsilateral left humerus fracture. Initial encounter.  EXAM: LEFT ELBOW - COMPLETE 3+ VIEW  COMPARISON:  None.  FINDINGS: Limited lateral imaging, nondiagnostic for evaluating joint fluid. There is no visible fracture or dislocation. Mild degenerative spurring about the elbow with lateral epicondylar enthesopathy.  IMPRESSION: 1. No acute findings. 2. Limited lateral view due to ipsilateral humerus fracture and difficulty with positioning. If ongoing concern for elbow pathology, repeat lateral view recommended when clinically able.   Electronically Signed   By: Monte Fantasia M.D.   On: 09/03/2014 07:33   Ct Head Wo Contrast  09/03/2014    CLINICAL DATA:  Fall this morning with left head injury headache and neck pain.  EXAM: CT HEAD WITHOUT CONTRAST  CT CERVICAL SPINE WITHOUT CONTRAST  TECHNIQUE: Multidetector CT imaging of the head and cervical spine was performed following the standard protocol without intravenous contrast. Multiplanar CT image reconstructions of the cervical spine were also generated.  COMPARISON:  Head CT from 3 days ago  FINDINGS: CT HEAD FINDINGS  Skull and Sinuses:Decreased swelling at the right parietal scalp hematoma/ laceration.  No evidence of acute fracture  Orbits: Right cataract resection.  No traumatic findings.  Brain: There are low-density subdural collections around the bilateral cerebral convexities which are new from previous, up to 6 mm on the right and 9 mm on the left. Would expect some remaining high density if these were subacute hematomas, and the appearance is consistent with hygromas. These cause mild crowding of sulci compared to previous, but no shift or herniation. No high-density/acute hemorrhage.  No evidence of acute infarct or hydrocephalus. Temporal predominant brain atrophy with temporal horn dilatation. In this patient with history of dementia, this usually reflects Alzheimer's disease. Gliosis in the lateral left temporal lobe and at the left temporal occipital junction, likely remote infarcts. Chronic small-vessel disease with ischemic gliosis mainly in the deep cerebral white matter.  CT CERVICAL SPINE FINDINGS  No evidence of acute fracture or traumatic malalignment. Mild anterolisthesis at C4-5, C5-6, and T1-T2 is stable from previous and related to severe facet arthropathy with bulky overgrowth, especially on the right. There is no gross cervical canal hematoma or prevertebral edema. Extradural posterior high-density at the level of C5-6 is ligamentum flavum hypertrophy with associated degenerative gas containing cyst.  Multi nodular goiter without airway compromise.  These results were  called by telephone at the time of interpretation on 09/03/2014 at 7:54 am to Dr. Carlota Raspberry, who verbally acknowledged these results.  IMPRESSION: 1. New hygromas around the bilateral cerebral convexities. The collections measure up to 9 mm and do not cause significant mass effect. 2. No evidence of acute cervical spine injury. 3. Senescent and degenerative changes are noted above.   Electronically Signed   By: Monte Fantasia M.D.   On: 09/03/2014 07:58   Ct Cervical Spine Wo Contrast  09/03/2014   CLINICAL DATA:  Fall this morning with left head injury headache and neck pain.  EXAM: CT HEAD WITHOUT CONTRAST  CT CERVICAL SPINE WITHOUT CONTRAST  TECHNIQUE: Multidetector CT imaging of the head and cervical spine was performed following the standard protocol without intravenous contrast. Multiplanar CT image reconstructions of the cervical spine were also generated.  COMPARISON:  Head CT from 3 days ago  FINDINGS: CT HEAD FINDINGS  Skull and Sinuses:Decreased swelling at the right parietal scalp hematoma/ laceration.  No evidence of acute fracture  Orbits: Right cataract resection.  No traumatic findings.  Brain: There are low-density subdural collections around the bilateral cerebral convexities which are new from previous, up to 6 mm on the right and 9 mm on  the left. Would expect some remaining high density if these were subacute hematomas, and the appearance is consistent with hygromas. These cause mild crowding of sulci compared to previous, but no shift or herniation. No high-density/acute hemorrhage.  No evidence of acute infarct or hydrocephalus. Temporal predominant brain atrophy with temporal horn dilatation. In this patient with history of dementia, this usually reflects Alzheimer's disease. Gliosis in the lateral left temporal lobe and at the left temporal occipital junction, likely remote infarcts. Chronic small-vessel disease with ischemic gliosis mainly in the deep cerebral white matter.  CT CERVICAL  SPINE FINDINGS  No evidence of acute fracture or traumatic malalignment. Mild anterolisthesis at C4-5, C5-6, and T1-T2 is stable from previous and related to severe facet arthropathy with bulky overgrowth, especially on the right. There is no gross cervical canal hematoma or prevertebral edema. Extradural posterior high-density at the level of C5-6 is ligamentum flavum hypertrophy with associated degenerative gas containing cyst.  Multi nodular goiter without airway compromise.  These results were called by telephone at the time of interpretation on 09/03/2014 at 7:54 am to Dr. Carlota Raspberry, who verbally acknowledged these results.  IMPRESSION: 1. New hygromas around the bilateral cerebral convexities. The collections measure up to 9 mm and do not cause significant mass effect. 2. No evidence of acute cervical spine injury. 3. Senescent and degenerative changes are noted above.   Electronically Signed   By: Monte Fantasia M.D.   On: 09/03/2014 07:58   Dg Shoulder Left  09/03/2014   CLINICAL DATA:  Fall.  Initial encounter.  EXAM: LEFT SHOULDER - 2+ VIEW  COMPARISON:  None.  FINDINGS: There is a transverse fracture through the surgical neck of the humerus with 50% medial and anterior displacement. There may be continuation into the greater tuberosity, but no noted displacement at this level. Located glenohumeral and acromioclavicular joints.  Moderate acromioclavicular osteoarthritis with inferior spurring. No notable glenohumeral arthritis.  Rotator cuff calcific tendinopathy.  IMPRESSION: Displaced surgical neck humerus fracture.   Electronically Signed   By: Monte Fantasia M.D.   On: 09/03/2014 07:30   Other results:  EKG: Normal sinus rhythm at 78 bpm, normal axis, normal intervals, no significant Q waves, no LVH by voltage, no ST-T changes. This ECG is unchanged from the previous ECG on 08/31/2014.  Assessment & Plan by Problem:  Ms. Ramone is an 81 year old woman with a history of dementia complicated  by traumatic brain injury and V. tach resulting in syncope who presents after a fall in the nursing home resulting in an acute left humeral fracture. She was also noted to have 2 new hygromas likely related to her previous fall. Her healthcare power of attorney is not interested in aggressive intervention for the hygromas or the humeral fracture unless there would be a chance for better pain control over simple pharmacologic therapy. Assessment by both neurosurgery and orthopedic surgery recommends conservative management and the patient is very comfortable on oral narcotics when seen on rounds today. She remains DO NOT RESUSCITATE. She is not stable to return back to her assisted living facility and will require a skilled nursing facility given her frequent falls over the last week.  1) Falls with left humeral fracture and new hygromas: We'll manage conservatively with narcotic pain control. Her total daily fentanyl dose will be converted to as needed Percocet. We are working on finding a skilled nursing facility for Ms. Moll in the interim.  2) Disposition: Pending placement in an appropriate skilled nursing facility. She remains DO  NOT RESUSCITATE.

## 2014-09-04 NOTE — Clinical Social Work Note (Signed)
ALF will be unable to accept patient as return at time of discharge due to frequent falls.    PT/OT evaluations pending.  Projection: SNF.  Patient has an insurance that requires an authorization [facility]) which will be submitted for review once PT/OT evaluation are documented.    RN and MD updated.  PT/OT consulted for STAT eval/treat and documentation.    Nonnie Done, LCSW [phone]  Psychiatric & Orthopedics (5N 1-8) Clinical Social Worker

## 2014-09-04 NOTE — Progress Notes (Signed)
Subjective: Tracy Reeves's agitation improved per RN with pain control. This morning she was alone in room and denied any arm pain or pain anywhere (yesterday reported arm pain). She did not know where she was and I explained she fell and broke her arm which concerned her.  On revisit she was sitting up in chair with meal in front of her and again said she was in no pain.  Objective: Vital signs in last 24 hours: Filed Vitals:   09/03/14 1026 09/03/14 1403 09/03/14 2020 09/04/14 0416  BP: 123/64 128/61 141/82 153/66  Pulse: 82 80  77  Temp:  98.1 F (36.7 C) 98.8 F (37.1 C) 97.5 F (36.4 C)  TempSrc:   Oral   Resp: 18 18 18 17   Height:      Weight:      SpO2: 98% 93% 95% 93%   Weight change:   Intake/Output Summary (Last 24 hours) at 09/04/14 1307 Last data filed at 09/04/14 0601  Gross per 24 hour  Intake    500 ml  Output    800 ml  Net   -300 ml   Gen: A&O to person, sleeping in bed comfortably, frail appearing HEENT: Staples and abrasion on R cranium, PERRL, EOMI, sclerae anicteric, moist mucous membranes Heart: Regular rate and rhythm, normal S1 S2, no murmurs, rubs, or gallops Lungs: Clear to auscultation bilaterally, respirations unlabored Abd: Soft, non-tender, non-distended, + bowel sounds, no hepatosplenomegaly Ext: L shoulder sling, no edema or cyanosis  Lab Results: Basic Metabolic Panel:  Recent Labs Lab 08/31/14 1300 09/01/14 0416 09/03/14 0649 09/03/14 1210  NA  --  141 138  --   K  --  4.1 4.8  --   CL  --  104 104  --   CO2  --  28 22  --   GLUCOSE  --  97 154*  --   BUN  --  20 37*  --   CREATININE 0.81 0.92 1.37* 1.05*  CALCIUM  --  9.1 8.6*  --   MG 1.9 2.0  --   --    Liver Function Tests:  Recent Labs Lab 09/01/14 0416  AST 20  ALT 12*  ALKPHOS 64  BILITOT 0.7  PROT 6.4*  ALBUMIN 3.2*   CBC:  Recent Labs Lab 08/31/14 0705  09/03/14 0649 09/03/14 1210  WBC 7.3  < > 8.4 10.0  NEUTROABS 3.0  --  5.3  --   HGB 13.5  <  > 12.9 11.9*  HCT 43.2  < > 40.4 37.6  MCV 89.1  < > 88.2 88.9  PLT 274  < > 251 249  < > = values in this interval not displayed. Cardiac Enzymes:  Recent Labs Lab 08/31/14 0705 09/03/14 0649  CKTOTAL  --  89  CKMB  --  1.5  TROPONINI <0.03  --    Thyroid Function Tests:  Recent Labs Lab 08/31/14 1300  TSH 0.445   Coagulation:  Recent Labs Lab 09/03/14 0649  LABPROT 14.1  INR 1.07   CK 89  Micro Results: Recent Results (from the past 240 hour(s))  MRSA PCR Screening     Status: None   Collection Time: 08/31/14 11:19 AM  Result Value Ref Range Status   MRSA by PCR NEGATIVE NEGATIVE Final    Comment:        The GeneXpert MRSA Assay (FDA approved for NASAL specimens only), is one component of a comprehensive MRSA colonization surveillance program. It is  not intended to diagnose MRSA infection nor to guide or monitor treatment for MRSA infections.    Studies/Results: Dg Chest 1 View  09/03/2014   CLINICAL DATA:  Fall in nursing home. Left humerus fracture. Initial encounter.  EXAM: CHEST  1 VIEW  COMPARISON:  08/31/2014  FINDINGS: Acute and displaced surgical neck fracture of the left humerus. Reference dedicated imaging. Additional fracture is identified.  Cardiomegaly is stable, as are aortic and hilar contours. Hypoventilation without evidence of contusion, hemothorax, or pneumothorax.  IMPRESSION: 1. No acute cardiopulmonary findings. 2. Displaced left humerus surgical neck fracture.   Electronically Signed   By: Monte Fantasia M.D.   On: 09/03/2014 07:34   Dg Elbow Complete Left  09/03/2014   CLINICAL DATA:  Fall in nursing home. Limited elbow positioning due to ipsilateral left humerus fracture. Initial encounter.  EXAM: LEFT ELBOW - COMPLETE 3+ VIEW  COMPARISON:  None.  FINDINGS: Limited lateral imaging, nondiagnostic for evaluating joint fluid. There is no visible fracture or dislocation. Mild degenerative spurring about the elbow with lateral epicondylar  enthesopathy.  IMPRESSION: 1. No acute findings. 2. Limited lateral view due to ipsilateral humerus fracture and difficulty with positioning. If ongoing concern for elbow pathology, repeat lateral view recommended when clinically able.   Electronically Signed   By: Monte Fantasia M.D.   On: 09/03/2014 07:33   Ct Head Wo Contrast  09/03/2014   CLINICAL DATA:  Fall this morning with left head injury headache and neck pain.  EXAM: CT HEAD WITHOUT CONTRAST  CT CERVICAL SPINE WITHOUT CONTRAST  TECHNIQUE: Multidetector CT imaging of the head and cervical spine was performed following the standard protocol without intravenous contrast. Multiplanar CT image reconstructions of the cervical spine were also generated.  COMPARISON:  Head CT from 3 days ago  FINDINGS: CT HEAD FINDINGS  Skull and Sinuses:Decreased swelling at the right parietal scalp hematoma/ laceration.  No evidence of acute fracture  Orbits: Right cataract resection.  No traumatic findings.  Brain: There are low-density subdural collections around the bilateral cerebral convexities which are new from previous, up to 6 mm on the right and 9 mm on the left. Would expect some remaining high density if these were subacute hematomas, and the appearance is consistent with hygromas. These cause mild crowding of sulci compared to previous, but no shift or herniation. No high-density/acute hemorrhage.  No evidence of acute infarct or hydrocephalus. Temporal predominant brain atrophy with temporal horn dilatation. In this patient with history of dementia, this usually reflects Alzheimer's disease. Gliosis in the lateral left temporal lobe and at the left temporal occipital junction, likely remote infarcts. Chronic small-vessel disease with ischemic gliosis mainly in the deep cerebral white matter.  CT CERVICAL SPINE FINDINGS  No evidence of acute fracture or traumatic malalignment. Mild anterolisthesis at C4-5, C5-6, and T1-T2 is stable from previous and related to  severe facet arthropathy with bulky overgrowth, especially on the right. There is no gross cervical canal hematoma or prevertebral edema. Extradural posterior high-density at the level of C5-6 is ligamentum flavum hypertrophy with associated degenerative gas containing cyst.  Multi nodular goiter without airway compromise.  These results were called by telephone at the time of interpretation on 09/03/2014 at 7:54 am to Dr. Carlota Raspberry, who verbally acknowledged these results.  IMPRESSION: 1. New hygromas around the bilateral cerebral convexities. The collections measure up to 9 mm and do not cause significant mass effect. 2. No evidence of acute cervical spine injury. 3. Senescent and degenerative changes are  noted above.   Electronically Signed   By: Monte Fantasia M.D.   On: 09/03/2014 07:58   Ct Cervical Spine Wo Contrast  09/03/2014   CLINICAL DATA:  Fall this morning with left head injury headache and neck pain.  EXAM: CT HEAD WITHOUT CONTRAST  CT CERVICAL SPINE WITHOUT CONTRAST  TECHNIQUE: Multidetector CT imaging of the head and cervical spine was performed following the standard protocol without intravenous contrast. Multiplanar CT image reconstructions of the cervical spine were also generated.  COMPARISON:  Head CT from 3 days ago  FINDINGS: CT HEAD FINDINGS  Skull and Sinuses:Decreased swelling at the right parietal scalp hematoma/ laceration.  No evidence of acute fracture  Orbits: Right cataract resection.  No traumatic findings.  Brain: There are low-density subdural collections around the bilateral cerebral convexities which are new from previous, up to 6 mm on the right and 9 mm on the left. Would expect some remaining high density if these were subacute hematomas, and the appearance is consistent with hygromas. These cause mild crowding of sulci compared to previous, but no shift or herniation. No high-density/acute hemorrhage.  No evidence of acute infarct or hydrocephalus. Temporal predominant brain  atrophy with temporal horn dilatation. In this patient with history of dementia, this usually reflects Alzheimer's disease. Gliosis in the lateral left temporal lobe and at the left temporal occipital junction, likely remote infarcts. Chronic small-vessel disease with ischemic gliosis mainly in the deep cerebral white matter.  CT CERVICAL SPINE FINDINGS  No evidence of acute fracture or traumatic malalignment. Mild anterolisthesis at C4-5, C5-6, and T1-T2 is stable from previous and related to severe facet arthropathy with bulky overgrowth, especially on the right. There is no gross cervical canal hematoma or prevertebral edema. Extradural posterior high-density at the level of C5-6 is ligamentum flavum hypertrophy with associated degenerative gas containing cyst.  Multi nodular goiter without airway compromise.  These results were called by telephone at the time of interpretation on 09/03/2014 at 7:54 am to Dr. Carlota Raspberry, who verbally acknowledged these results.  IMPRESSION: 1. New hygromas around the bilateral cerebral convexities. The collections measure up to 9 mm and do not cause significant mass effect. 2. No evidence of acute cervical spine injury. 3. Senescent and degenerative changes are noted above.   Electronically Signed   By: Monte Fantasia M.D.   On: 09/03/2014 07:58   Dg Shoulder Left  09/03/2014   CLINICAL DATA:  Fall.  Initial encounter.  EXAM: LEFT SHOULDER - 2+ VIEW  COMPARISON:  None.  FINDINGS: There is a transverse fracture through the surgical neck of the humerus with 50% medial and anterior displacement. There may be continuation into the greater tuberosity, but no noted displacement at this level. Located glenohumeral and acromioclavicular joints.  Moderate acromioclavicular osteoarthritis with inferior spurring. No notable glenohumeral arthritis.  Rotator cuff calcific tendinopathy.  IMPRESSION: Displaced surgical neck humerus fracture.   Electronically Signed   By: Monte Fantasia M.D.    On: 09/03/2014 07:30   Medications: I have reviewed the patient's current medications. Scheduled Meds: . amiodarone  200 mg Oral BID  . amLODipine  5 mg Oral Daily  . divalproex  125 mg Oral QHS  . levothyroxine  25 mcg Oral QAC breakfast  . PARoxetine  5 mg Oral q morning - 10a  . sodium chloride  3 mL Intravenous Q12H   Continuous Infusions:  PRN Meds:.oxyCODONE-acetaminophen Assessment/Plan: Principal Problem:   Fall Active Problems:   TBI (traumatic brain injury)   Hypothyroid  Humeral surgical neck fracture   V tach   Subdural hygroma   Fracture closed, humerus  #Displaced L humerus surgical neck fracture: L humerus fractured during fall. Seen by ortho who placed sling and recommend NWB in LUE and transition to hanging arm cast in out-patient clinic. She received fentanyl 300 mcg over the past day. Daughter agrees goal should be pain management and would only want interventions if it would make her mother more comfortable. -appreciate orthopedics -transition to percocet 5-325 mg q4hprn -NWB in LUE  #Hygromas: New hygromas noted on head CT around the bilateral cerebral convexities. Seen by neurosurgery who feel this is likely arachnoid rupture secondary to fall and unlikely to cause significant mass effect and no surgical intervention or imaging. -appreciate neurosurgery   #s/p Recurrent Falls: Tracy Reeves was readmitted for another fall. This was during another episode of syncope likely due to ventricular tachycardia. On last admission, she was seen by electrophysiology who started amiodarone but per daughter does not want more aggressive interventions. -cont amiodarone 200 mg bid  #HTN: BP this morning elevated at 153/66 -resume home amlodipine 5 mg daily  #Hypothyroidism: At home she is on synthroid 25 mcg daily -cont synthroid 25 mcg daily  Dispo: Disposition is deferred at this time, awaiting improvement of current medical problems.  Anticipated discharge in  approximately 0-1 day(s).   The patient does have a current PCP ([name], MD) and does need an [facility] hospital follow-up appointment after discharge.  The patient does not have transportation limitations that hinder transportation to clinic appointments.  .Services Needed at time of discharge: Y = Yes, Blank = No PT:   OT:   RN:   Equipment:   Other:     LOS: 1 day   Tracy Aline, MD 09/04/2014, 1:07 PM

## 2014-09-04 NOTE — Evaluation (Signed)
Physical Therapy Evaluation Patient Details Name: Tracy Reeves MRN: [id number] DOB: 1922/11/24 Today's Date: 09/04/2014   History of Present Illness  Pt is a 81 y/o F s/p fall at home w/ resultant L humeral fx and B subdural hygromas.  Pt has h/o falls w/ recent hospitalization 2/2 subdural hematoma and subarachnoid hemorrhage back in 2/16 2/2 severe fall.  Pt's additional PMH includes HTN, dementia, syncope.  Clinical Impression  Pt admitted with above diagnosis. Pt currently with functional limitations due to the deficits listed below (see PT Problem List). Pt able to follow one step commands and requires 1 person HHA to perform sit<>stand and ambulate 5 ft to recliner chair. Pt will benefit from skilled PT to increase their independence and safety with mobility to allow discharge to the venue listed below.     Follow Up Recommendations SNF;Supervision/Assistance - 24 hour    Equipment Recommendations  Other (comment) (TBD by next venue of care)    Recommendations for Other Services       Precautions / Restrictions Precautions Precautions: Fall;Shoulder Type of Shoulder Precautions: NWB LUE Required Braces or Orthoses: Sling Restrictions Weight Bearing Restrictions: Yes LUE Weight Bearing: Non weight bearing      Mobility  Bed Mobility Overal bed mobility: Needs Assistance Bed Mobility: Rolling;Sidelying to Sit Rolling: Min guard Sidelying to sit: Min guard       General bed mobility comments: Pt able to roll and sit up EOB w/ increased time and use of bed rails.  Verbal cues to hold herself up w/ RUE when sitting.    Transfers Overall transfer level: Needs assistance Equipment used: 1 person hand held assist Transfers: Sit to/from Stand Sit to Stand: Min assist         General transfer comment: 1 person HHA w/ VCs to use my hand for support.    Ambulation/Gait Ambulation/Gait assistance: Min assist Ambulation Distance (Feet): 5 Feet Assistive device: 1  person hand held assist Gait Pattern/deviations: Step-through pattern;Antalgic;Trunk flexed;Staggering left;Staggering right;Shuffle   Gait velocity interpretation: Below normal speed for age/gender General Gait Details: 1 person HHA to recliner chair.  Directional cues to get to recliner chair.  Pt unsteady during ambulation but able to stabilize w/ 1 person HHA.  Stairs            Wheelchair Mobility    Modified Rankin (Stroke Patients Only)       Balance Overall balance assessment: Needs assistance;History of Falls Sitting-balance support: Single extremity supported;Feet supported Sitting balance-Leahy Scale: Fair     Standing balance support: Single extremity supported;During functional activity Standing balance-Leahy Scale: Poor                               Pertinent Vitals/Pain Pain Assessment: Faces Faces Pain Scale: Hurts little more Pain Location: LUE Pain Descriptors / Indicators: Moaning;Guarding;Grimacing Pain Intervention(s): Limited activity within patient's tolerance;Monitored during session;Repositioned    Home Living Family/patient expects to be discharged to:: Skilled nursing facility                 Additional Comments: no family available to provide information    Prior Function Level of Independence: Needs assistance         Comments: No family available to provide information.  Pt from memory care unit PTA w/ unknown level of assist.     Hand Dominance        Extremity/Trunk Assessment   Upper Extremity Assessment: Defer to  OT evaluation           Lower Extremity Assessment: Generalized weakness      Cervical / Trunk Assessment: Kyphotic  Communication   Communication: No difficulties  Cognition Arousal/Alertness: Awake/alert Behavior During Therapy: WFL for tasks assessed/performed Overall Cognitive Status: History of cognitive impairments - at baseline       Memory: Decreased short-term memory               General Comments General comments (skin integrity, edema, etc.): Pt able to follow one step commands consistently but appears unaware of deficits.    Exercises General Exercises - Lower Extremity Ankle Circles/Pumps: AROM;Both;10 reps;Seated Long Arc Quad: AROM;Both;Seated;10 reps Hip Flexion/Marching: AROM;Both;10 reps;Seated      Assessment/Plan    PT Assessment Patient needs continued PT services  PT Diagnosis Difficulty walking;Abnormality of gait;Generalized weakness;Acute pain   PT Problem List Decreased strength;Decreased range of motion;Decreased activity tolerance;Decreased balance;Decreased mobility;Decreased coordination;Decreased cognition;Decreased knowledge of use of DME;Decreased safety awareness;Decreased knowledge of precautions;Decreased skin integrity;Pain  PT Treatment Interventions DME instruction;Gait training;Stair training;Functional mobility training;Therapeutic activities;Therapeutic exercise;Balance training;Neuromuscular re-education;Cognitive remediation;Patient/family education;Wheelchair mobility training;Modalities   PT Goals (Current goals can be found in the Care Plan section) Acute Rehab PT Goals Patient Stated Goal: none stated PT Goal Formulation: Patient unable to participate in goal setting Time For Goal Achievement: 09/11/14 Potential to Achieve Goals: Good    Frequency Min 3X/week   Barriers to discharge        Co-evaluation               End of Session   Activity Tolerance: Patient tolerated treatment well Patient left: in chair;with call bell/phone within reach;with chair alarm set Nurse Communication: Mobility status;Precautions;Weight bearing status    Functional Assessment Tool Used: clinical judgement Functional Limitation: Mobility: Walking and moving around Mobility: Walking and Moving Around Current Status (W9794): At least 20 percent but less than 40 percent impaired, limited or restricted Mobility:  Walking and Moving Around Goal Status [phone]): At least 1 percent but less than 20 percent impaired, limited or restricted    Time: [id number] PT Time Calculation (min) (ACUTE ONLY): 15 min   Charges:   PT Evaluation $Initial PT Evaluation Tier I: 1 Procedure     PT G Codes:   PT G-Codes **NOT FOR INPATIENT CLASS** Functional Assessment Tool Used: clinical judgement Functional Limitation: Mobility: Walking and moving around Mobility: Walking and Moving Around Current Status (B8675): At least 20 percent but less than 40 percent impaired, limited or restricted Mobility: Walking and Moving Around Goal Status [phone]): At least 1 percent but less than 20 percent impaired, limited or restricted   Joslyn Hy PT, DPT [phone] Pager: [phone] 09/04/2014, 10:56 AM

## 2014-09-05 DIAGNOSIS — Y92129 Unspecified place in nursing home as the place of occurrence of the external cause: Secondary | ICD-10-CM | POA: Diagnosis not present

## 2014-09-05 DIAGNOSIS — W19XXXA Unspecified fall, initial encounter: Secondary | ICD-10-CM | POA: Diagnosis not present

## 2014-09-05 DIAGNOSIS — S42212A Unspecified displaced fracture of surgical neck of left humerus, initial encounter for closed fracture: Secondary | ICD-10-CM | POA: Diagnosis not present

## 2014-09-05 DIAGNOSIS — Z9181 History of falling: Secondary | ICD-10-CM | POA: Diagnosis not present

## 2014-09-05 MED ORDER — OXYCODONE-ACETAMINOPHEN 5-325 MG PO TABS: 1.0000 | ORAL_TABLET | Freq: Four times a day (QID) | ORAL | Status: DC | PRN

## 2014-09-05 MED ORDER — TRAMADOL HCL 50 MG PO TABS: 25.0000 mg | ORAL_TABLET | Freq: Three times a day (TID) | ORAL | Status: DC | PRN

## 2014-09-05 MED ORDER — AMLODIPINE BESYLATE 5 MG PO TABS: 2.5000 mg | ORAL_TABLET | Freq: Every day | ORAL | Status: DC

## 2014-09-05 MED ORDER — AMLODIPINE BESYLATE 2.5 MG PO TABS
2.5000 mg | ORAL_TABLET | Freq: Every day | ORAL | Status: DC
  Administered 2014-09-05: 2.5 mg via ORAL
  Filled 2014-09-05: qty 1

## 2014-09-05 NOTE — Progress Notes (Signed)
Internal Medicine Attending  Date: 09/05/2014  Patient name: Reading record number: [id number] Date of birth: 07-23-1922 Age: 81 y.o. Gender: female  I saw and evaluated the patient. I reviewed the resident's note by Dr. Ethelene Hal and I agree with the resident's findings and plans as documented in his progress note.  Tracy Reeves was resting comfortably when seen on rounds this morning. The pain regimen seems to be effective. She was considered stable for transfer to a skilled nursing facility this afternoon.

## 2014-09-05 NOTE — Progress Notes (Signed)
Subjective: Tracy Reeves. Tracy Reeves was resting peacefully in bed this morning.  Objective: Vital signs in last 24 hours: Filed Vitals:   09/04/14 0416 09/04/14 1706 09/04/14 2113 09/05/14 0653  BP: 153/66 136/57 108/50 124/59  Pulse: 77 85 92 83  Temp: 97.5 F (36.4 C) 98.3 F (36.8 C) 98 F (36.7 C) 98 F (36.7 C)  TempSrc:  Oral Oral Oral  Resp: 17 17 17 16   Height:      Weight:      SpO2: 93% 97% 94% 94%   Weight change:   Intake/Output Summary (Last 24 hours) at 09/05/14 1045 Last data filed at 09/05/14 0900  Gross per 24 hour  Intake    360 ml  Output      0 ml  Net    360 ml   Gen: A&O to person, sleeping in bed comfortably, frail appearing HEENT: Staples and abrasion on R cranium, PERRL, EOMI, sclerae anicteric, moist mucous membranes Heart: Regular rate and rhythm, normal S1 S2, no murmurs, rubs, or gallops Lungs: Clear to auscultation bilaterally anteriorly, respirations unlabored Abd: Soft, non-tender, non-distended, + bowel sounds, no hepatosplenomegaly Ext: L shoulder sling, no edema or cyanosis  Lab Results: Basic Metabolic Panel:  Recent Labs Lab 08/31/14 1300 09/01/14 0416 09/03/14 0649 09/03/14 1210  NA  --  141 138  --   K  --  4.1 4.8  --   CL  --  104 104  --   CO2  --  28 22  --   GLUCOSE  --  97 154*  --   BUN  --  20 37*  --   CREATININE 0.81 0.92 1.37* 1.05*  CALCIUM  --  9.1 8.6*  --   MG 1.9 2.0  --   --    Liver Function Tests:  Recent Labs Lab 09/01/14 0416  AST 20  ALT 12*  ALKPHOS 64  BILITOT 0.7  PROT 6.4*  ALBUMIN 3.2*   CBC:  Recent Labs Lab 08/31/14 0705  09/03/14 0649 09/03/14 1210  WBC 7.3  < > 8.4 10.0  NEUTROABS 3.0  --  5.3  --   HGB 13.5  < > 12.9 11.9*  HCT 43.2  < > 40.4 37.6  MCV 89.1  < > 88.2 88.9  PLT 274  < > 251 249  < > = values in this interval not displayed. Cardiac Enzymes:  Recent Labs Lab 08/31/14 0705 09/03/14 0649  CKTOTAL  --  89  CKMB  --  1.5  TROPONINI <0.03  --     Thyroid Function Tests:  Recent Labs Lab 08/31/14 1300  TSH 0.445   Coagulation:  Recent Labs Lab 09/03/14 0649  LABPROT 14.1  INR 1.07   CK 89  Micro Results: Recent Results (from the past 240 hour(s))  MRSA PCR Screening     Status: None   Collection Time: 08/31/14 11:19 AM  Result Value Ref Range Status   MRSA by PCR NEGATIVE NEGATIVE Final    Comment:        The GeneXpert MRSA Assay (FDA approved for NASAL specimens only), is one component of a comprehensive MRSA colonization surveillance program. It is not intended to diagnose MRSA infection nor to guide or monitor treatment for MRSA infections.    Studies/Results: No results found. Medications: I have reviewed the patient's current medications. Scheduled Meds: . amiodarone  200 mg Oral BID  . amLODipine  2.5 mg Oral Daily  . divalproex  125 mg  Oral QHS  . levothyroxine  25 mcg Oral QAC breakfast  . PARoxetine  5 mg Oral q morning - 10a  . sodium chloride  3 mL Intravenous Q12H   Continuous Infusions: . 0.9 % NaCl with KCl 20 mEq / L 50 mL/hr at 09/05/14 0207   PRN Meds:.oxyCODONE-acetaminophen Assessment/Plan: Principal Problem:   Fall Active Problems:   TBI (traumatic brain injury)   Hypothyroid   Humeral surgical neck fracture   V tach   Subdural hygroma   Fracture closed, humerus   Proximal humeral fracture   Hygroma  #Displaced L humerus surgical neck fracture: L humerus fractured during fall. Seen by ortho who placed sling and recommend NWB in LUE and transition to hanging arm cast in out-patient clinic. Transitioned from fentanyl to percocet yesterday and only used one. She was comfortable on exam. Daughter agrees goal should be pain management and would only want interventions if it would make her mother more comfortable. -appreciate orthopedics -percocet 5-325 mg q6hprn -NWB in LUE  #Hygromas: New hygromas noted on head CT around the bilateral cerebral convexities. Seen by  neurosurgery who feel this is likely arachnoid rupture secondary to fall and unlikely to cause significant mass effect and no surgical intervention or imaging. -appreciate neurosurgery   #s/p Recurrent Falls: Tracy Reeves was readmitted for another fall. This was during another episode of syncope likely due to ventricular tachycardia. On last admission, she was seen by electrophysiology who started amiodarone but per daughter does not want more aggressive interventions. PT recs SNF, awaiting insurance authorization. -cont amiodarone 200 mg bid  #HTN: BP low this morning after resuming amlodipine 5 mg daily -decrease amlodipine to 2.5 mg daily  #Hypothyroidism: At home she is on synthroid 25 mcg daily -cont synthroid 25 mcg daily  Dispo: Disposition is deferred at this time, awaiting improvement of current medical problems.  Anticipated discharge in approximately 0-1 day(s), pending SNF placement  The patient does have a current PCP (Tracy Brunner, MD) and does need an [facility] hospital follow-up appointment after discharge.  The patient does not have transportation limitations that hinder transportation to clinic appointments.  .Services Needed at time of discharge: Y = Yes, Blank = No PT:   OT:   RN:   Equipment:   Other:     LOS: 2 days   Tracy Aline, MD 09/05/2014, 10:45 AM

## 2014-09-05 NOTE — Discharge Instructions (Signed)
It was a pleasure to care for you at [facility]. You can take the percocet every 6 hours as needed for the pain. We have made you an appointment with the bone doctor who will change your shoulder sling. The skilled nursing facility will schedule your PCP follow-up.Please return to the Emergency Department or seek medical attention if you have any new or worsening chest pain, trouble breathing, or other worrisome medical condition.   Lottie Mussel, MD

## 2014-09-05 NOTE — Progress Notes (Signed)
Elias Else discharged SNF per MD order. Discharge instructions reviewed and discussed with patient. All questions and concerns answered. Copy of instructions and scripts given to patient. IV removed.  Patient escorted to car by staff in a wheelchair. No distress noted upon discharge.   Tarri Abernethy R 09/05/2014 4:14 PM

## 2014-09-05 NOTE — Discharge Summary (Signed)
Name: Tracy Reeves MRN: [id number] DOB: 1922-08-03 81 y.o. PCP: [name], MD  Date of Admission: 09/03/2014  5:54 AM Date of Discharge: 09/05/2014 Attending Physician: Oval Linsey, MD  Discharge Diagnosis:  Principal Problem:   Fall Active Problems:   TBI (traumatic brain injury)   Hypothyroid   Humeral surgical neck fracture   V tach   Subdural hygroma   Fracture closed, humerus   Proximal humeral fracture   Hygroma  Discharge Medications:   Medication List    STOP taking these medications        losartan 50 MG tablet  Commonly known as:  COZAAR      TAKE these medications        amiodarone 200 MG tablet  Commonly known as:  PACERONE  Take 1 tablet (200 mg total) by mouth 2 (two) times daily.     amLODipine 5 MG tablet  Commonly known as:  NORVASC  Take 0.5 tablets (2.5 mg total) by mouth daily.     divalproex 125 MG DR tablet  Commonly known as:  DEPAKOTE  Take 125 mg by mouth at bedtime.     levothyroxine 25 MCG tablet  Commonly known as:  SYNTHROID, LEVOTHROID  Take 25 mcg by mouth daily before breakfast.     oxyCODONE-acetaminophen 5-325 MG per tablet  Commonly known as:  PERCOCET/ROXICET  Take 1 tablet by mouth every 6 (six) hours as needed for moderate pain.     PARoxetine 10 MG tablet  Commonly known as:  PAXIL  Take 5 mg by mouth every morning.     saccharomyces boulardii 250 MG capsule  Commonly known as:  FLORASTOR  Take 250 mg by mouth 2 (two) times daily.     traMADol 50 MG tablet  Commonly known as:  ULTRAM  Take 0.5 tablets (25 mg total) by mouth 3 (three) times daily as needed (for pain).        Disposition and follow-up:   Tracy Reeves was discharged from [facility] in Stable condition.  At the hospital follow up visit please address:  1.  Pain control, goals of care, antihypertensive regimen  2.  Labs / imaging needed at time of follow-up: consider BMP for creatinine  3.  Pending labs/  test needing follow-up: none  Follow-up Appointments: Follow-up Information    Follow up with Renette Butters, MD On 09/14/2014.   Specialty:  Orthopedic Surgery   Why:  @ 9: 45 am   Contact information:   [address] [place] [id number] [phone]       Schedule an appointment as soon as possible for a visit with Sarina Ser, Hewitt Blade, MD.   Specialty:  Internal Medicine   Why:  SNF please arrange in next week   Contact information:   [address] [address] [phone]       Discharge Instructions: Discharge Instructions    Increase activity slowly    Complete by:  As directed            Procedures Performed:  Dg Chest 1 View  09/03/2014   CLINICAL DATA:  Fall in nursing home. Left humerus fracture. Initial encounter.  EXAM: CHEST  1 VIEW  COMPARISON:  08/31/2014  FINDINGS: Acute and displaced surgical neck fracture of the left humerus. Reference dedicated imaging. Additional fracture is identified.  Cardiomegaly is stable, as are aortic and hilar contours. Hypoventilation without evidence of contusion,  hemothorax, or pneumothorax.  IMPRESSION: 1. No acute cardiopulmonary findings. 2. Displaced left humerus surgical neck fracture.   Electronically Signed   By: Monte Fantasia M.D.   On: 09/03/2014 07:34   Dg Elbow Complete Left  09/03/2014   CLINICAL DATA:  Fall in nursing home. Limited elbow positioning due to ipsilateral left humerus fracture. Initial encounter.  EXAM: LEFT ELBOW - COMPLETE 3+ VIEW  COMPARISON:  None.  FINDINGS: Limited lateral imaging, nondiagnostic for evaluating joint fluid. There is no visible fracture or dislocation. Mild degenerative spurring about the elbow with lateral epicondylar enthesopathy.  IMPRESSION: 1. No acute findings. 2. Limited lateral view due to ipsilateral humerus fracture and difficulty with positioning. If ongoing concern for elbow pathology, repeat lateral view recommended  when clinically able.   Electronically Signed   By: Monte Fantasia M.D.   On: 09/03/2014 07:33   Ct Head Wo Contrast  09/03/2014   CLINICAL DATA:  Fall this morning with left head injury headache and neck pain.  EXAM: CT HEAD WITHOUT CONTRAST  CT CERVICAL SPINE WITHOUT CONTRAST  TECHNIQUE: Multidetector CT imaging of the head and cervical spine was performed following the standard protocol without intravenous contrast. Multiplanar CT image reconstructions of the cervical spine were also generated.  COMPARISON:  Head CT from 3 days ago  FINDINGS: CT HEAD FINDINGS  Skull and Sinuses:Decreased swelling at the right parietal scalp hematoma/ laceration.  No evidence of acute fracture  Orbits: Right cataract resection.  No traumatic findings.  Brain: There are low-density subdural collections around the bilateral cerebral convexities which are new from previous, up to 6 mm on the right and 9 mm on the left. Would expect some remaining high density if these were subacute hematomas, and the appearance is consistent with hygromas. These cause mild crowding of sulci compared to previous, but no shift or herniation. No high-density/acute hemorrhage.  No evidence of acute infarct or hydrocephalus. Temporal predominant brain atrophy with temporal horn dilatation. In this patient with history of dementia, this usually reflects Alzheimer's disease. Gliosis in the lateral left temporal lobe and at the left temporal occipital junction, likely remote infarcts. Chronic small-vessel disease with ischemic gliosis mainly in the deep cerebral white matter.  CT CERVICAL SPINE FINDINGS  No evidence of acute fracture or traumatic malalignment. Mild anterolisthesis at C4-5, C5-6, and T1-T2 is stable from previous and related to severe facet arthropathy with bulky overgrowth, especially on the right. There is no gross cervical canal hematoma or prevertebral edema. Extradural posterior high-density at the level of C5-6 is ligamentum flavum  hypertrophy with associated degenerative gas containing cyst.  Multi nodular goiter without airway compromise.  These results were called by telephone at the time of interpretation on 09/03/2014 at 7:54 am to Dr. Carlota Raspberry, who verbally acknowledged these results.  IMPRESSION: 1. New hygromas around the bilateral cerebral convexities. The collections measure up to 9 mm and do not cause significant mass effect. 2. No evidence of acute cervical spine injury. 3. Senescent and degenerative changes are noted above.   Electronically Signed   By: Monte Fantasia M.D.   On: 09/03/2014 07:58   Ct Head Wo Contrast  08/31/2014   CLINICAL DATA:  Nursing home patient fell, head laceration RIGHT scalp, history hypertension, Alzheimer's dementia  EXAM: CT HEAD WITHOUT CONTRAST  CT CERVICAL SPINE WITHOUT CONTRAST  TECHNIQUE: Multidetector CT imaging of the head and cervical spine was performed following the standard protocol without intravenous contrast. Multiplanar CT image reconstructions of the cervical spine  were also generated.  COMPARISON:  None  FINDINGS: CT HEAD FINDINGS  Generalized atrophy.  Normal ventricular morphology.  No midline shift or mass effect.  Small vessel chronic ischemic changes of deep cerebral white matter including pons.  No intracranial hemorrhage, mass lesion, or evidence of acute infarction.  No extra-axial fluid collections.  Small RIGHT parietal scalp hematoma.  Skull intact.  Tiny air-fluid level in sphenoid sinus appear.  Paranasal sinuses and mastoid air cells otherwise clear.  Extensive atherosclerotic calcifications of the internal carotid arteries bilaterally at the carotid siphons.  CT CERVICAL SPINE FINDINGS  Motion artifacts, for which repeat imaging was performed.  Disc space narrowing and endplate spur formation greatest at C6-C7.  Advanced multilevel facet degenerative changes with mild anterolisthesis at C4-C5 and C5-C6.  Vertebral body heights maintained without fracture or bone  destruction.  Lung apices clear.  Prevertebral soft tissues normal thickness.  Scattered atherosclerotic calcifications.  Diffuse enlargement of both thyroid lobes, multinodular in appearance.  IMPRESSION: Atrophy with small vessel chronic ischemic changes of deep cerebral white matter.  No acute intracranial abnormalities.  Multilevel degenerative disc and facet disease changes of the cervical spine as above.  No acute cervical spine abnormalities.  Extensive atherosclerotic calcifications.  Enlarged multinodular thyroid gland.   Electronically Signed   By: Lavonia Dana M.D.   On: 08/31/2014 08:22   Ct Cervical Spine Wo Contrast  09/03/2014   CLINICAL DATA:  Fall this morning with left head injury headache and neck pain.  EXAM: CT HEAD WITHOUT CONTRAST  CT CERVICAL SPINE WITHOUT CONTRAST  TECHNIQUE: Multidetector CT imaging of the head and cervical spine was performed following the standard protocol without intravenous contrast. Multiplanar CT image reconstructions of the cervical spine were also generated.  COMPARISON:  Head CT from 3 days ago  FINDINGS: CT HEAD FINDINGS  Skull and Sinuses:Decreased swelling at the right parietal scalp hematoma/ laceration.  No evidence of acute fracture  Orbits: Right cataract resection.  No traumatic findings.  Brain: There are low-density subdural collections around the bilateral cerebral convexities which are new from previous, up to 6 mm on the right and 9 mm on the left. Would expect some remaining high density if these were subacute hematomas, and the appearance is consistent with hygromas. These cause mild crowding of sulci compared to previous, but no shift or herniation. No high-density/acute hemorrhage.  No evidence of acute infarct or hydrocephalus. Temporal predominant brain atrophy with temporal horn dilatation. In this patient with history of dementia, this usually reflects Alzheimer's disease. Gliosis in the lateral left temporal lobe and at the left temporal  occipital junction, likely remote infarcts. Chronic small-vessel disease with ischemic gliosis mainly in the deep cerebral white matter.  CT CERVICAL SPINE FINDINGS  No evidence of acute fracture or traumatic malalignment. Mild anterolisthesis at C4-5, C5-6, and T1-T2 is stable from previous and related to severe facet arthropathy with bulky overgrowth, especially on the right. There is no gross cervical canal hematoma or prevertebral edema. Extradural posterior high-density at the level of C5-6 is ligamentum flavum hypertrophy with associated degenerative gas containing cyst.  Multi nodular goiter without airway compromise.  These results were called by telephone at the time of interpretation on 09/03/2014 at 7:54 am to Dr. Carlota Raspberry, who verbally acknowledged these results.  IMPRESSION: 1. New hygromas around the bilateral cerebral convexities. The collections measure up to 9 mm and do not cause significant mass effect. 2. No evidence of acute cervical spine injury. 3. Senescent and degenerative changes  are noted above.   Electronically Signed   By: Monte Fantasia M.D.   On: 09/03/2014 07:58   Ct Cervical Spine Wo Contrast  08/31/2014   CLINICAL DATA:  Nursing home patient fell, head laceration RIGHT scalp, history hypertension, Alzheimer's dementia  EXAM: CT HEAD WITHOUT CONTRAST  CT CERVICAL SPINE WITHOUT CONTRAST  TECHNIQUE: Multidetector CT imaging of the head and cervical spine was performed following the standard protocol without intravenous contrast. Multiplanar CT image reconstructions of the cervical spine were also generated.  COMPARISON:  None  FINDINGS: CT HEAD FINDINGS  Generalized atrophy.  Normal ventricular morphology.  No midline shift or mass effect.  Small vessel chronic ischemic changes of deep cerebral white matter including pons.  No intracranial hemorrhage, mass lesion, or evidence of acute infarction.  No extra-axial fluid collections.  Small RIGHT parietal scalp hematoma.  Skull intact.   Tiny air-fluid level in sphenoid sinus appear.  Paranasal sinuses and mastoid air cells otherwise clear.  Extensive atherosclerotic calcifications of the internal carotid arteries bilaterally at the carotid siphons.  CT CERVICAL SPINE FINDINGS  Motion artifacts, for which repeat imaging was performed.  Disc space narrowing and endplate spur formation greatest at C6-C7.  Advanced multilevel facet degenerative changes with mild anterolisthesis at C4-C5 and C5-C6.  Vertebral body heights maintained without fracture or bone destruction.  Lung apices clear.  Prevertebral soft tissues normal thickness.  Scattered atherosclerotic calcifications.  Diffuse enlargement of both thyroid lobes, multinodular in appearance.  IMPRESSION: Atrophy with small vessel chronic ischemic changes of deep cerebral white matter.  No acute intracranial abnormalities.  Multilevel degenerative disc and facet disease changes of the cervical spine as above.  No acute cervical spine abnormalities.  Extensive atherosclerotic calcifications.  Enlarged multinodular thyroid gland.   Electronically Signed   By: Lavonia Dana M.D.   On: 08/31/2014 08:22   Dg Chest Portable 1 View  08/31/2014   CLINICAL DATA:  Shortness of breath.  Recent fall.  EXAM: PORTABLE CHEST - 1 VIEW  COMPARISON:  None.  FINDINGS: Heart size is mildly enlarged. Both lungs are clear. No evidence of pneumothorax or pleural effusion.  IMPRESSION: Mild cardiomegaly.  No active lung disease.   Electronically Signed   By: Earle Gell M.D.   On: 08/31/2014 07:20   Dg Shoulder Left  09/03/2014   CLINICAL DATA:  Fall.  Initial encounter.  EXAM: LEFT SHOULDER - 2+ VIEW  COMPARISON:  None.  FINDINGS: There is a transverse fracture through the surgical neck of the humerus with 50% medial and anterior displacement. There may be continuation into the greater tuberosity, but no noted displacement at this level. Located glenohumeral and acromioclavicular joints.  Moderate acromioclavicular  osteoarthritis with inferior spurring. No notable glenohumeral arthritis.  Rotator cuff calcific tendinopathy.  IMPRESSION: Displaced surgical neck humerus fracture.   Electronically Signed   By: Monte Fantasia M.D.   On: 09/03/2014 07:30    Admission HPI: Tracy Reeves an 81 yo woman pmh recent admission for Vtach, [place] dementia complicated by TBI causing memory care residence at [facility], HTN, and hypothyroidism presents again after another fall. Pt is accompanied by her daughter and HCPOA and describe that this event was with the patient sitting in a chair and then suddenly blacked out and fell out of the chair onto the floor hitting her left shoulder. The patient didn't describe having any pain or have any symptoms to her aide whom was with her at the time. Again no seizure like activity and the  tech described the patient to be in her normal state of health and the event happened suddenly and not associated ith any activity such as toileting or eating, event was w/o any urinary or stool incontinence, vomiting, tongue biting, or shaking/seizure like activity. A medication log sheet was available with the patient and it is clear pt has been taking amiodarone. No arrhythmic events seen in the ED. HCPOA is worried if [facility] is still appropriate for the patient given the high frequency of recent falls.   Hospital Course by problem list: Principal Problem:   Fall Active Problems:   TBI (traumatic brain injury)   Hypothyroid   Humeral surgical neck fracture   V tach   Subdural hygroma   Fracture closed, humerus   Proximal humeral fracture   Hygroma   #Displaced L humerus surgical neck fracture: L humerus fractured during fall. Seen by ortho who placed sling and recommend NWB in LUE and transition to hanging arm cast in out-patient clinic which was set up with Dr Percell Miller. Transitioned from fentanyl to percocet and appears to be tolerating pain well. She only used one percocet 5-325 the day before  discharge. Daughter agrees goal should be pain management and would only want interventions if it would make her mother more comfortable. She is discharged with percocet 5-325 mg q6hprn and SNF provider can reassess her comfort and adjust as appropriate.   #Hygromas: New hygromas noted on head CT around the bilateral cerebral convexities. Seen by neurosurgery who feel this is likely arachnoid rupture secondary to fall and unlikely to cause significant mass effect and no surgical intervention or imaging.  #s/p Recurrent Falls secondary to ventricular tachycardia: Tracy Reeves was readmitted for another fall. This was during another episode of syncope likely due to ventricular tachycardia. On last admission, she was seen by electrophysiology who started amiodarone 200 mg bid but per daughter does not want more aggressive interventions. Patient should keep previous cardiology appointment for assessment of amiodarone dosing with Chanetta Marshall NP on 09/17/14 at 10 am.  #AKI: Presenting creatinine 1.3 when previously normal on recent admission. She was clinically dry which improved with IVF. Losartan held in setting of AKI and soft BP. PCP may decide to check BMP to see if resolved.  #HTN: BP low on presentation so held home losartan and amlodipine but then became hypertensive so resumed amlodipine 5 mg daily. The morning of discharge she again had soft BP so decreased amlodipine to 2.5 mg daily. PCP should reassess.  #Hypothyroidism: At home she is on synthroid 25 mcg daily which was continued.  Discharge Vitals:   BP 124/59 mmHg  Pulse 83  Temp(Src) 98 F (36.7 C) (Oral)  Resp 16  Ht 5\' 3"  (1.6 m)  Wt 130 lb (58.968 kg)  BMI 23.03 kg/m2  SpO2 94%  Discharge Physical Exam Gen: A&O to person, sleeping in bed comfortably, frail appearing HEENT: Staples and abrasion on R cranium, PERRL, EOMI, sclerae anicteric, moist mucous membranes Heart: Regular rate and rhythm, normal S1 S2, no murmurs, rubs, or  gallops Lungs: Clear to auscultation bilaterally anteriorly, respirations unlabored Abd: Soft, non-tender, non-distended, + bowel sounds, no hepatosplenomegaly Ext: L shoulder sling, no edema or cyanosis  Discharge Labs:  Basic Metabolic Panel:  Recent Labs Lab 08/31/14 1300 09/01/14 0416 09/03/14 0649 09/03/14 1210  NA  --  141 138  --   K  --  4.1 4.8  --   CL  --  104 104  --   CO2  --  28 22  --   GLUCOSE  --  97 154*  --   BUN  --  20 37*  --   CREATININE 0.81 0.92 1.37* 1.05*  CALCIUM  --  9.1 8.6*  --   MG 1.9 2.0  --   --    Liver Function Tests:  Recent Labs Lab 09/01/14 0416  AST 20  ALT 12*  ALKPHOS 64  BILITOT 0.7  PROT 6.4*  ALBUMIN 3.2*   CBC:  Recent Labs Lab 08/31/14 0705  09/03/14 0649 09/03/14 1210  WBC 7.3  < > 8.4 10.0  NEUTROABS 3.0  --  5.3  --   HGB 13.5  < > 12.9 11.9*  HCT 43.2  < > 40.4 37.6  MCV 89.1  < > 88.2 88.9  PLT 274  < > 251 249  < > = values in this interval not displayed. Cardiac Enzymes:  Recent Labs Lab 08/31/14 0705 09/03/14 0649  CKTOTAL  --  89  CKMB  --  1.5  TROPONINI <0.03  --    Coagulation:  Recent Labs Lab 09/03/14 0649  LABPROT 14.1  INR 1.07     Signed: [name], MD 09/05/2014, 12:20 PM    Services Ordered on Discharge: to SNF Equipment Ordered on Discharge: to SNF

## 2014-09-05 NOTE — Discharge Planning (Signed)
Patient will discharge today per MD order. Patient will discharge to: [facility] RN to call report prior to transportation to: [phone] Transportation: Daughter will transport between 4-5pm  CSW sent discharge summary to SNF for review.  Packet is complete.  RN, patient and family aware of discharge plans.  Nonnie Done, [place] [phone]  Psychiatric & Orthopedics (5N 1-16) Clinical Social Worker

## 2014-09-05 NOTE — Clinical Social Work Placement (Signed)
   CLINICAL SOCIAL WORK PLACEMENT  NOTE  Date:  09/05/2014  Patient Details  Name: Tracy Reeves MRN: [id number] Date of Birth: 04/10/1922  Clinical Social Work is seeking post-discharge placement for this patient at the [place] level of care (*CSW will initial, date and re-position this form in  chart as items are completed):  Yes   Patient/family provided with [place] Work Department's list of facilities offering this level of care within the geographic area requested by the patient (or if unable, by the patient's family).  Yes   Patient/family informed of their freedom to choose among providers that offer the needed level of care, that participate in Medicare, Medicaid or managed care program needed by the patient, have an available bed and are willing to accept the patient.  Yes   Patient/family informed of [facility]'s ownership interest in [facility] and [facility], as well as of the fact that they are under no obligation to receive care at these facilities.  PASRR submitted to EDS on       PASRR number received on       Existing PASRR number confirmed on 09/05/14     FL2 transmitted to all facilities in geographic area requested by pt/family on 09/05/14     FL2 transmitted to all facilities within larger geographic area on       Patient informed that his/her managed care company has contracts with or will negotiate with certain facilities, including the following:   (list given to daughter)     Yes   Patient/family informed of bed offers received.  Patient chooses bed at [facility]     Physician recommends and patient chooses bed at  (none)    Patient to be transferred to [facility] on 09/05/14.  Patient to be transferred to facility by daughter, Diane     Patient family notified on 09/05/14 of transfer.  Name of family member notified:  Diane     PHYSICIAN Please sign FL2, Please prepare priority discharge  summary, including medications, Please sign DNR     Additional Comment:    _______________________________________________ Dulcy Fanny, LCSW 09/05/2014, 12:02 PM

## 2014-09-05 NOTE — Clinical Social Work Note (Signed)
Clinical Social Work Assessment  Patient Details  Name: Tracy Reeves MRN: [id number] Date of Birth: 06-13-1922  Date of referral:  09/05/14               Reason for consult:  Facility Placement                Permission sought to share information with:  Facility Sport and exercise psychologist, Family Supports Permission granted to share information::  No (patient AMS)  Name::     Diane, daughter  Agency::  [facility]  Relationship::     Contact Information:  Daughter, Diane  Housing/Transportation Living arrangements for the past 2 months:  [place] of Information:  Adult Children Patient Interpreter Needed:  None Criminal Activity/Legal Involvement Pertinent to Current Situation/Hospitalization:  No - Comment as needed Significant Relationships:  Adult Children Lives with:    Do you feel safe going back to the place where you live?  No (Frequent falls and readmission ) Need for family participation in patient care:     Care giving concerns:  Daughter does not wish for patient to return to [facility]F due to the amount of recent falls patient has been "allowedEmergency planning/management officer / plan:  CSW received consult re: patient being from ALF, [facility].  Patient is a readmission and has had 3 falls since February at [facility].  At this time PT is recommending SNF level of care.  Daughter is agreeable. Per report and of note: daughter pays out of pocket for ALF as patient is not eligible for Medicaid.  Employment status:  Retired Forensic scientist:  [employer]) PT Recommendations:  [facility], [place] / Referral to community resources:  [place]  Patient/Family's Response to care:  Daughter is agreeable to SNF level of care and appreciative of CSW assistance.    Patient/Family's Understanding of and Emotional Response to Diagnosis, Current Treatment, and  Prognosis:  Daughter is realistic regarding patient's needs at time of discharge and is agreeable to SNF/STR.  Emotional Assessment Appearance:  Appears stated age Attitude/Demeanor/Rapport:   (AMS) Affect (typically observed):   (AMS) Orientation:  Oriented to Self Alcohol / Substance use:  Not Applicable Psych involvement (Current and /or in the community):  No (Comment)  Discharge Needs  Concerns to be addressed:  No discharge needs identified Readmission within the last 30 days:  No Current discharge risk:  None Barriers to Discharge:  No Barriers Identified   Dulcy Fanny, LCSW 09/05/2014, 11:59 AM

## 2014-09-13 ENCOUNTER — Encounter: Payer: Self-pay | Admitting: *Deleted

## 2014-09-17 ENCOUNTER — Encounter: Payer: Medicare Other | Admitting: Nurse Practitioner

## 2014-09-28 ENCOUNTER — Emergency Department (HOSPITAL_COMMUNITY): Payer: Medicare Other

## 2014-09-28 ENCOUNTER — Emergency Department (HOSPITAL_COMMUNITY)
Admission: EM | Admit: 2014-09-28 | Discharge: 2014-09-28 | Disposition: A | Discharge status: Home / Self Care | Payer: Medicare Other | Attending: Emergency Medicine | Admitting: Emergency Medicine

## 2014-09-28 ENCOUNTER — Encounter (HOSPITAL_COMMUNITY): Payer: Self-pay

## 2014-09-28 DIAGNOSIS — Z87820 Personal history of traumatic brain injury: Secondary | ICD-10-CM | POA: Diagnosis not present

## 2014-09-28 DIAGNOSIS — Z8781 Personal history of (healed) traumatic fracture: Secondary | ICD-10-CM | POA: Insufficient documentation

## 2014-09-28 DIAGNOSIS — W01198A Fall on same level from slipping, tripping and stumbling with subsequent striking against other object, initial encounter: Secondary | ICD-10-CM | POA: Diagnosis not present

## 2014-09-28 DIAGNOSIS — Y92129 Unspecified place in nursing home as the place of occurrence of the external cause: Secondary | ICD-10-CM | POA: Insufficient documentation

## 2014-09-28 DIAGNOSIS — E079 Disorder of thyroid, unspecified: Secondary | ICD-10-CM | POA: Insufficient documentation

## 2014-09-28 DIAGNOSIS — F0390 Unspecified dementia without behavioral disturbance: Secondary | ICD-10-CM | POA: Diagnosis not present

## 2014-09-28 DIAGNOSIS — Y9389 Activity, other specified: Secondary | ICD-10-CM | POA: Insufficient documentation

## 2014-09-28 DIAGNOSIS — Y998 Other external cause status: Secondary | ICD-10-CM | POA: Diagnosis not present

## 2014-09-28 DIAGNOSIS — Z8619 Personal history of other infectious and parasitic diseases: Secondary | ICD-10-CM | POA: Diagnosis not present

## 2014-09-28 DIAGNOSIS — R791 Abnormal coagulation profile: Secondary | ICD-10-CM | POA: Diagnosis not present

## 2014-09-28 DIAGNOSIS — I10 Essential (primary) hypertension: Secondary | ICD-10-CM | POA: Insufficient documentation

## 2014-09-28 DIAGNOSIS — S0990XA Unspecified injury of head, initial encounter: Secondary | ICD-10-CM | POA: Diagnosis present

## 2014-09-28 DIAGNOSIS — Z79899 Other long term (current) drug therapy: Secondary | ICD-10-CM | POA: Diagnosis not present

## 2014-09-28 LAB — CBC
HEMATOCRIT: 33.9 % — AB (ref 36.0–46.0)
Hemoglobin: 10.7 g/dL — ABNORMAL LOW (ref 12.0–15.0)
MCH: 27.4 pg (ref 26.0–34.0)
MCHC: 31.6 g/dL (ref 30.0–36.0)
MCV: 86.7 fL (ref 78.0–100.0)
PLATELETS: 378 10*3/uL (ref 150–400)
RBC: 3.91 MIL/uL (ref 3.87–5.11)
RDW: 14.8 % (ref 11.5–15.5)
WBC: 9.2 10*3/uL (ref 4.0–10.5)

## 2014-09-28 LAB — BASIC METABOLIC PANEL
Anion gap: 8 (ref 5–15)
BUN: 23 mg/dL — AB (ref 6–20)
CHLORIDE: 102 mmol/L (ref 101–111)
CO2: 28 mmol/L (ref 22–32)
Calcium: 8.4 mg/dL — ABNORMAL LOW (ref 8.9–10.3)
Creatinine, Ser: 1.04 mg/dL — ABNORMAL HIGH (ref 0.44–1.00)
GFR calc Af Amer: 54 mL/min — ABNORMAL LOW (ref >60)
GFR calc non Af Amer: 46 mL/min — ABNORMAL LOW (ref >60)
Glucose, Bld: 153 mg/dL — ABNORMAL HIGH (ref 65–99)
Potassium: 3.9 mmol/L (ref 3.5–5.1)
Sodium: 138 mmol/L (ref 135–145)

## 2014-09-28 LAB — PROTIME-INR
INR: 4.89 — ABNORMAL HIGH (ref 0.00–1.49)
PROTHROMBIN TIME: 47.4 s — AB (ref 11.6–15.2)

## 2014-09-28 NOTE — ED Notes (Signed)
DNR paperwork sent with pt.

## 2014-09-28 NOTE — Discharge Instructions (Signed)
Please hold your next 2 doses of Coumadin.  Call your Coumadin team to determine your ongoing management and dose and frequency.

## 2014-09-28 NOTE — ED Provider Notes (Signed)
CSN: [id number]     Arrival date & time 09/28/14  1539 History   First MD Initiated Contact with Patient 09/28/14 1545     Chief Complaint  Patient presents with  . Fall    Level V caveat: Dementia  HPI Patient presents to the emergency department complaining of a witnessed fall today at her nursing home.  She reports that she fell slightly lightheaded.  She has a history of dementia and cannot provide any additional information.  The patient does take Coumadin for a recent left upper extremity DVT per the daughter who is the healthcare power of attorney.  There was no actual fall.  Patient denies neck pain.  She does report mild pain to the right head.    Past Medical History  Diagnosis Date  . Hypertension   . Shingles   . Thyroid disease   . Dementia   . Thyroid activity decreased   . Fall at nursing home   . Syncope   . Humeral fracture 09/03/2014  . Renal insufficiency   . TBI (traumatic brain injury)    Past Surgical History  Procedure Laterality Date  . Abdominal hysterectomy    . Cataract extraction     History reviewed. No pertinent family history. History  Substance Use Topics  . Smoking status: Never Smoker   . Smokeless tobacco: Never Used  . Alcohol Use: No   OB History    No data available     Review of Systems  Unable to perform ROS: Dementia      Allergies  Eggs or egg-derived products  Home Medications   Prior to Admission medications   Medication Sig Start Date End Date Taking? Authorizing Provider  amiodarone (PACERONE) 200 MG tablet Take 1 tablet (200 mg total) by mouth 2 (two) times daily. 09/01/14   [name], MD  amLODipine (NORVASC) 5 MG tablet Take 0.5 tablets (2.5 mg total) by mouth daily. 09/05/14   [name], MD  divalproex (DEPAKOTE) 125 MG DR tablet Take 125 mg by mouth at bedtime.    Historical Provider, MD  levothyroxine (SYNTHROID, LEVOTHROID) 25 MCG tablet Take 25 mcg by mouth daily before breakfast.    Historical  Provider, MD  oxyCODONE-acetaminophen (PERCOCET/ROXICET) 5-325 MG per tablet Take 1 tablet by mouth every 6 (six) hours as needed for moderate pain. 09/05/14   [name], MD  PARoxetine (PAXIL) 10 MG tablet Take 5 mg by mouth every morning.     Historical Provider, MD  saccharomyces boulardii (FLORASTOR) 250 MG capsule Take 250 mg by mouth 2 (two) times daily.    Historical Provider, MD  traMADol (ULTRAM) 50 MG tablet Take 0.5 tablets (25 mg total) by mouth 3 (three) times daily as needed (for pain). 09/05/14   [name], MD   BP 135/48 mmHg  Pulse 69  Temp(Src) 98.8 F (37.1 C) (Oral)  Resp 22  Ht 5\' 1"  (1.549 m)  Wt 130 lb (58.968 kg)  BMI 24.58 kg/m2  SpO2 96% Physical Exam  Constitutional: She appears well-developed and well-nourished. No distress.  HENT:  Head: Normocephalic and atraumatic.  Eyes: EOM are normal.  Neck: Normal range of motion.  Cardiovascular: Normal rate, regular rhythm and normal heart sounds.   Pulmonary/Chest: Effort normal and breath sounds normal.  Abdominal: Soft. She exhibits no distension. There is no tenderness.  Musculoskeletal: Normal range of motion.  Neurological: She is alert.  c spine nontender. No c spine step off  Skin: Skin  is warm and dry.  Psychiatric: She has a normal mood and affect. Judgment normal.  Nursing note and vitals reviewed.   ED Course  Procedures (including critical care time) Labs Review Labs Reviewed  PROTIME-INR - Abnormal; Notable for the following:    Prothrombin Time 47.4 (*)    INR 4.89 (*)    All other components within normal limits  CBC - Abnormal; Notable for the following:    Hemoglobin 10.7 (*)    HCT 33.9 (*)    All other components within normal limits  BASIC METABOLIC PANEL - Abnormal; Notable for the following:    Glucose, Bld 153 (*)    BUN 23 (*)    Creatinine, Ser 1.04 (*)    Calcium 8.4 (*)    GFR calc non Af Amer 46 (*)    GFR calc Af Amer 54 (*)    All other components within  normal limits    Imaging Review Ct Head Wo Contrast  09/28/2014   CLINICAL DATA:  Pain following fall.  Confusion.  EXAM: CT HEAD WITHOUT CONTRAST  TECHNIQUE: Contiguous axial images were obtained from the base of the skull through the vertex without intravenous contrast.  COMPARISON:  September 03, 2014 and August 31, 2014  FINDINGS: Moderate diffuse atrophy is stable. There has been interval resolution of supratentorial subdural hygromas since prior study. On the current examination, there is no appreciable intracranial mass, hemorrhage, extra-axial fluid collection, or midline shift. Patchy small vessel disease throughout the centra semiovale bilaterally is present. There is evidence of a prior small infarct in the left occipital lobe, stable. There is no new gray-white compartment lesion. There is no demonstrable acute infarct. The bony calvarium appears intact. The mastoid air cells are clear.  IMPRESSION: Atrophy with supratentorial small vessel disease. Prior small left occipital lobe infarct, stable. No acute infarct apparent. No hemorrhage or mass effect. No extra-axial fluid collections are currently appreciable. There is been interval resort shin of subdural hygromas bilaterally compared to prior study.   Electronically Signed   By: Lowella Grip III M.D.   On: 09/28/2014 16:23  I personally reviewed the imaging tests through PACS system I reviewed available ER/hospitalization records through the EMR    EKG Interpretation None      MDM   Final diagnoses:  None    Head CT is without acute abnormality.  The patient does have an INR of 4.89.  Vas that she hold her Coumadin level for the next 2 days.  She'll follow-up with her Coumadin team regarding ongoing management of her Coumadin and INR.  Head injury warnings given.  Discussed with daughter.  Full range of motion of bilateral hips.    Jola Schmidt, MD 09/28/14 [phone]

## 2014-09-28 NOTE — ED Notes (Addendum)
Pt brought from [place] for a witnessed fall.  Pt fell from a standing position hitting the back of her head on the arm of a couch.  Pt is a fall risk at the facility and was not using her wheelchair.  Pt is confused and facility was unsure if this is pt baseline.  Pt daughter is on the way.  Pt is c/o no pain.  Left arm is in cast from previous fall.  Pt is on Coumadin.

## 2014-11-07 ENCOUNTER — Emergency Department (HOSPITAL_COMMUNITY): Payer: Medicare Other

## 2014-11-07 ENCOUNTER — Encounter (HOSPITAL_COMMUNITY): Payer: Self-pay

## 2014-11-07 ENCOUNTER — Emergency Department (HOSPITAL_COMMUNITY)
Admission: EM | Admit: 2014-11-07 | Discharge: 2014-11-07 | Disposition: A | Discharge status: Skilled Nursing Facility | Payer: Medicare Other | Attending: Emergency Medicine | Admitting: Emergency Medicine

## 2014-11-07 DIAGNOSIS — Y92129 Unspecified place in nursing home as the place of occurrence of the external cause: Secondary | ICD-10-CM | POA: Diagnosis not present

## 2014-11-07 DIAGNOSIS — Z87448 Personal history of other diseases of urinary system: Secondary | ICD-10-CM | POA: Diagnosis not present

## 2014-11-07 DIAGNOSIS — Y9389 Activity, other specified: Secondary | ICD-10-CM | POA: Insufficient documentation

## 2014-11-07 DIAGNOSIS — Z8619 Personal history of other infectious and parasitic diseases: Secondary | ICD-10-CM | POA: Diagnosis not present

## 2014-11-07 DIAGNOSIS — Z79899 Other long term (current) drug therapy: Secondary | ICD-10-CM | POA: Diagnosis not present

## 2014-11-07 DIAGNOSIS — I10 Essential (primary) hypertension: Secondary | ICD-10-CM | POA: Insufficient documentation

## 2014-11-07 DIAGNOSIS — Y998 Other external cause status: Secondary | ICD-10-CM | POA: Insufficient documentation

## 2014-11-07 DIAGNOSIS — W08XXXA Fall from other furniture, initial encounter: Secondary | ICD-10-CM | POA: Insufficient documentation

## 2014-11-07 DIAGNOSIS — Z7901 Long term (current) use of anticoagulants: Secondary | ICD-10-CM | POA: Diagnosis not present

## 2014-11-07 DIAGNOSIS — S199XXA Unspecified injury of neck, initial encounter: Secondary | ICD-10-CM | POA: Diagnosis present

## 2014-11-07 DIAGNOSIS — F0390 Unspecified dementia without behavioral disturbance: Secondary | ICD-10-CM | POA: Diagnosis not present

## 2014-11-07 DIAGNOSIS — W19XXXA Unspecified fall, initial encounter: Secondary | ICD-10-CM

## 2014-11-07 NOTE — ED Notes (Signed)
Per [facility]-  DNR yellow copy. Pt resides at [facility]. Pt with witnessed fall. Tripped by table landing on right side. NO LOC. Assessed dry blood to right side of head. Neuro intact. Neck pain. c- collar applied. Pt c/o of rt hip pain - no deformity present. EDP [facility] present at [facility] to evaluate this pt

## 2014-11-07 NOTE — ED Notes (Signed)
[facility] called and report given to [place]. Pt being transported to the facility with her daughter.

## 2014-11-07 NOTE — ED Provider Notes (Signed)
CSN: [id number]     Arrival date & time 11/07/14  1623 History   First MD Initiated Contact with Patient 11/07/14 1654     Chief Complaint  Patient presents with  . Fall  . Neck Pain  . Hip Pain    right side     (Consider location/radiation/quality/duration/timing/severity/associated sxs/prior Treatment) Patient is a 81 y.o. female presenting with fall. The history is provided by the EMS personnel.  Fall This is a recurrent problem. The current episode started less than 1 hour ago. The problem occurs constantly. The problem has not changed since onset.Pertinent negatives include no chest pain and no abdominal pain. Nothing aggravates the symptoms. Nothing relieves the symptoms. She has tried nothing for the symptoms.    Past Medical History  Diagnosis Date  . Hypertension   . Shingles   . Thyroid disease   . Dementia   . Thyroid activity decreased   . Fall at nursing home   . Syncope   . Humeral fracture 09/03/2014  . Renal insufficiency   . TBI (traumatic brain injury)    Past Surgical History  Procedure Laterality Date  . Abdominal hysterectomy    . Cataract extraction     No family history on file. Social History  Substance Use Topics  . Smoking status: Never Smoker   . Smokeless tobacco: Never Used  . Alcohol Use: No   OB History    No data available     Review of Systems  Unable to perform ROS: Dementia  Cardiovascular: Negative for chest pain.  Gastrointestinal: Negative for abdominal pain.      Allergies  Eggs or egg-derived products  Home Medications   Prior to Admission medications   Medication Sig Start Date End Date Taking? Authorizing Provider  Amino Acids-Protein Hydrolys (FEEDING SUPPLEMENT, PRO-STAT SUGAR FREE 64,) LIQD Take 30 mLs by mouth 2 (two) times daily.   Yes Historical Provider, MD  amiodarone (PACERONE) 200 MG tablet Take 1 tablet (200 mg total) by mouth 2 (two) times daily. 09/01/14  Yes [name], MD  amLODipine (NORVASC)  5 MG tablet Take 0.5 tablets (2.5 mg total) by mouth daily. 09/05/14  Yes [name], MD  cholecalciferol (VITAMIN D) 1000 UNITS tablet Take 2,000 Units by mouth daily.   Yes Historical Provider, MD  divalproex (DEPAKOTE) 125 MG DR tablet Take 125 mg by mouth at bedtime.   Yes Historical Provider, MD  levothyroxine (SYNTHROID, LEVOTHROID) 25 MCG tablet Take 25 mcg by mouth daily before breakfast.   Yes Historical Provider, MD  LORazepam (ATIVAN) 0.5 MG tablet Take 0.25 mg by mouth daily.   Yes Historical Provider, MD  LORazepam (ATIVAN) 0.5 MG tablet Take 0.25 mg by mouth every 8 (eight) hours as needed (agitation).   Yes Historical Provider, MD  magnesium oxide (MAG-OX) 400 MG tablet Take 400 mg by mouth at bedtime.   Yes Historical Provider, MD  oxyCODONE (OXY IR/ROXICODONE) 5 MG immediate release tablet Take 5 mg by mouth every 4 (four) hours as needed for severe pain.   Yes Historical Provider, MD  PARoxetine (PAXIL) 10 MG tablet Take 5 mg by mouth every morning.    Yes Historical Provider, MD  saccharomyces boulardii (FLORASTOR) 250 MG capsule Take 250 mg by mouth 2 (two) times daily.   Yes Historical Provider, MD  vitamin B-12 (CYANOCOBALAMIN) 1000 MCG tablet Take 1,000 mcg by mouth daily.   Yes Historical Provider, MD  warfarin (COUMADIN) 1 MG tablet Take 1 mg by  mouth as directed. Take 1 tablet by mouth daily on Mon thru Sat.   Yes Historical Provider, MD  warfarin (COUMADIN) 2 MG tablet Take 2 mg by mouth as directed. Take 1 tablet by mouth daily on Sun.   Yes Historical Provider, MD  oxyCODONE-acetaminophen (PERCOCET/ROXICET) 5-325 MG per tablet Take 1 tablet by mouth every 6 (six) hours as needed for moderate pain. Patient not taking: Reported on 11/07/2014 09/05/14   [name], MD  traMADol (ULTRAM) 50 MG tablet Take 0.5 tablets (25 mg total) by mouth 3 (three) times daily as needed (for pain). Patient not taking: Reported on 11/07/2014 09/05/14   [name], MD   BP 140/52 mmHg   Pulse 70  Temp(Src) 98.5 F (36.9 C) (Oral)  Resp 18  SpO2 95% Physical Exam  Constitutional: She appears well-developed and well-nourished. No distress.  HENT:  Head: Normocephalic.  Eyes: Conjunctivae are normal.  Neck: Neck supple. Muscular tenderness present. No spinous process tenderness present. No tracheal deviation present.  Cardiovascular: Normal rate and regular rhythm.   Pulmonary/Chest: Effort normal. No respiratory distress.  Abdominal: Soft. She exhibits no distension.  Musculoskeletal: Normal range of motion.  Neurological: She is alert. She is disoriented (at baseline).  Skin: Skin is warm and dry.  Psychiatric: She has a normal mood and affect.    ED Course  Procedures (including critical care time) Labs Review Labs Reviewed - No data to display  Imaging Review Dg Chest 1 View  11/07/2014   CLINICAL DATA:  Tripped on a table landing on the right side. Right hip pain.  EXAM: CHEST  1 VIEW  COMPARISON:  09/03/2014  FINDINGS: There are bilateral chronic bronchitic changes. There is no focal parenchymal opacity. There is no pleural effusion or pneumothorax. The heart and mediastinal contours are unremarkable.  There is mild osteoarthritis of the right shoulder. There is a chronic nonunited left proximal humeral fracture involving the surgical neck.  IMPRESSION: No active disease.   Electronically Signed   By: Kathreen Devoid   On: 11/07/2014 18:05   Ct Head Wo Contrast  11/07/2014   CLINICAL DATA:  Witnessed fall, dried blood along the right side of head, neck pain, dementia  EXAM: CT HEAD WITHOUT CONTRAST  CT CERVICAL SPINE WITHOUT CONTRAST  TECHNIQUE: Multidetector CT imaging of the head and cervical spine was performed following the standard protocol without intravenous contrast. Multiplanar CT image reconstructions of the cervical spine were also generated.  COMPARISON:  CT head dated 09/28/2014. CT head/cervical spine dated 09/03/2014.  FINDINGS: CT HEAD FINDINGS  No  evidence of parenchymal hemorrhage or extra-axial fluid collection. No mass lesion, mass effect, or midline shift.  No CT evidence of acute infarction. Old left occipital infarct (series 3/image 15).  Subcortical white matter and periventricular small vessel ischemic changes. Intracranial atherosclerosis.  Cortical and central atrophy. Atrophy involving the bilateral temporal lobes.  Secondary ventriculomegaly, particularly involving the temporal horns, left greater than right.  The visualized paranasal sinuses are essentially clear. The mastoid air cells are unopacified.  No evidence of calvarial fracture.  CT CERVICAL SPINE FINDINGS  Exaggerated upper cervical lordosis.  No evidence of fracture or dislocation. Vertebral body heights are maintained. Dens appears intact.  No prevertebral soft tissue swelling.  Moderate multilevel degenerative changes.  Visualized thyroid is enlarged/nodular.  Visualized lung apices are clear.  IMPRESSION: No evidence of acute intracranial abnormality. Old left occipital infarct. Atrophy with secondary ventriculomegaly. Small vessel ischemic changes.  No evidence of traumatic  injury to the cervical spine. Moderate multilevel degenerative changes.   Electronically Signed   By: Julian Hy M.D.   On: 11/07/2014 18:40   Ct Cervical Spine Wo Contrast  11/07/2014   CLINICAL DATA:  Witnessed fall, dried blood along the right side of head, neck pain, dementia  EXAM: CT HEAD WITHOUT CONTRAST  CT CERVICAL SPINE WITHOUT CONTRAST  TECHNIQUE: Multidetector CT imaging of the head and cervical spine was performed following the standard protocol without intravenous contrast. Multiplanar CT image reconstructions of the cervical spine were also generated.  COMPARISON:  CT head dated 09/28/2014. CT head/cervical spine dated 09/03/2014.  FINDINGS: CT HEAD FINDINGS  No evidence of parenchymal hemorrhage or extra-axial fluid collection. No mass lesion, mass effect, or midline shift.  No CT  evidence of acute infarction. Old left occipital infarct (series 3/image 15).  Subcortical white matter and periventricular small vessel ischemic changes. Intracranial atherosclerosis.  Cortical and central atrophy. Atrophy involving the bilateral temporal lobes.  Secondary ventriculomegaly, particularly involving the temporal horns, left greater than right.  The visualized paranasal sinuses are essentially clear. The mastoid air cells are unopacified.  No evidence of calvarial fracture.  CT CERVICAL SPINE FINDINGS  Exaggerated upper cervical lordosis.  No evidence of fracture or dislocation. Vertebral body heights are maintained. Dens appears intact.  No prevertebral soft tissue swelling.  Moderate multilevel degenerative changes.  Visualized thyroid is enlarged/nodular.  Visualized lung apices are clear.  IMPRESSION: No evidence of acute intracranial abnormality. Old left occipital infarct. Atrophy with secondary ventriculomegaly. Small vessel ischemic changes.  No evidence of traumatic injury to the cervical spine. Moderate multilevel degenerative changes.   Electronically Signed   By: Julian Hy M.D.   On: 11/07/2014 18:40   Dg Hip Unilat With Pelvis 2-3 Views Right  11/07/2014   CLINICAL DATA:  Status post fall today. Right hip pain. Initial encounter.  EXAM: DG HIP (WITH OR WITHOUT PELVIS) 2-3V RIGHT  COMPARISON:  None.  FINDINGS: No acute bony or joint abnormality is identified. Mild to moderate degenerative change is present about the hips. The patient has marked lower lumbar degenerative change. Convex right scoliosis is noted. Enthesopathic change about the greater trochanters is identified.  IMPRESSION: No acute abnormality.   Electronically Signed   By: Inge Rise M.D.   On: 11/07/2014 18:05   I have personally reviewed and evaluated these images and lab results as part of my medical decision-making.   EKG Interpretation None      MDM   Final diagnoses:  Fall from standing,  initial encounter   81 y.o. female presents with fall from standing from the nursing facility. She has had multiple witnessed falls recently, during this one she tripped forward and was complaining of some mild lateral neck pain. Her head and C-spine CTs as well as plain films of her chest and pelvis are negative for acute traumatic injury and at this time she is at her baseline and is stable for discharge.    Leo Grosser, MD 11/08/14 [phone]

## 2014-11-07 NOTE — ED Notes (Signed)
Patient transported to X-ray 

## 2014-11-07 NOTE — ED Notes (Signed)
Bed: NOTR71 Expected date:  Expected time:  Means of arrival:  Comments: EMS/elderly /fall

## 2014-12-17 ENCOUNTER — Emergency Department (HOSPITAL_COMMUNITY)
Admission: EM | Admit: 2014-12-17 | Discharge: 2014-12-17 | Disposition: A | Discharge status: Home / Self Care | Payer: Medicare Other | Attending: Emergency Medicine | Admitting: Emergency Medicine

## 2014-12-17 ENCOUNTER — Emergency Department (HOSPITAL_COMMUNITY): Payer: Medicare Other

## 2014-12-17 ENCOUNTER — Encounter (HOSPITAL_COMMUNITY): Payer: Self-pay | Admitting: Family Medicine

## 2014-12-17 DIAGNOSIS — Z87820 Personal history of traumatic brain injury: Secondary | ICD-10-CM | POA: Diagnosis not present

## 2014-12-17 DIAGNOSIS — Y92128 Other place in nursing home as the place of occurrence of the external cause: Secondary | ICD-10-CM | POA: Diagnosis not present

## 2014-12-17 DIAGNOSIS — Z8619 Personal history of other infectious and parasitic diseases: Secondary | ICD-10-CM | POA: Insufficient documentation

## 2014-12-17 DIAGNOSIS — E079 Disorder of thyroid, unspecified: Secondary | ICD-10-CM | POA: Diagnosis not present

## 2014-12-17 DIAGNOSIS — S12190A Other displaced fracture of second cervical vertebra, initial encounter for closed fracture: Secondary | ICD-10-CM | POA: Diagnosis not present

## 2014-12-17 DIAGNOSIS — Z79899 Other long term (current) drug therapy: Secondary | ICD-10-CM | POA: Diagnosis not present

## 2014-12-17 DIAGNOSIS — I10 Essential (primary) hypertension: Secondary | ICD-10-CM | POA: Insufficient documentation

## 2014-12-17 DIAGNOSIS — S12100A Unspecified displaced fracture of second cervical vertebra, initial encounter for closed fracture: Secondary | ICD-10-CM

## 2014-12-17 DIAGNOSIS — F0390 Unspecified dementia without behavioral disturbance: Secondary | ICD-10-CM | POA: Diagnosis not present

## 2014-12-17 DIAGNOSIS — Y998 Other external cause status: Secondary | ICD-10-CM | POA: Diagnosis not present

## 2014-12-17 DIAGNOSIS — Y9389 Activity, other specified: Secondary | ICD-10-CM | POA: Diagnosis not present

## 2014-12-17 DIAGNOSIS — S199XXA Unspecified injury of neck, initial encounter: Secondary | ICD-10-CM | POA: Diagnosis present

## 2014-12-17 DIAGNOSIS — W01198A Fall on same level from slipping, tripping and stumbling with subsequent striking against other object, initial encounter: Secondary | ICD-10-CM | POA: Diagnosis not present

## 2014-12-17 DIAGNOSIS — W19XXXA Unspecified fall, initial encounter: Secondary | ICD-10-CM

## 2014-12-17 NOTE — ED Provider Notes (Signed)
CSN: [id number]     Arrival date & time 12/17/14  1519 History   First MD Initiated Contact with Patient 12/17/14 1613     Chief Complaint  Patient presents with  . Fall     (Consider location/radiation/quality/duration/timing/severity/associated sxs/prior Treatment) Patient is a 81 y.o. female presenting with fall. The history is provided by the patient.  Fall   level V caveat due to dementia. Reportedly tripped while attempting to walk today. Hematoma to her left forehead. No reported loss consciousness but patient connected much history. Patient has been ambulatory since. Complaining of some pain on her left forehead. She is at her mental status baseline.  Past Medical History  Diagnosis Date  . Hypertension   . Shingles   . Thyroid disease   . Dementia   . Thyroid activity decreased   . Fall at nursing home   . Syncope   . Humeral fracture 09/03/2014  . Renal insufficiency   . TBI (traumatic brain injury)    Past Surgical History  Procedure Laterality Date  . Abdominal hysterectomy    . Cataract extraction     History reviewed. No pertinent family history. Social History  Substance Use Topics  . Smoking status: Never Smoker   . Smokeless tobacco: Never Used  . Alcohol Use: No   OB History    No data available     Review of Systems  Unable to perform ROS     Allergies  Eggs or egg-derived products  Home Medications   Prior to Admission medications   Medication Sig Start Date End Date Taking? Authorizing Provider  Amino Acids-Protein Hydrolys (FEEDING SUPPLEMENT, PRO-STAT SUGAR FREE 64,) LIQD Take 30 mLs by mouth 2 (two) times daily.    Historical Provider, MD  amiodarone (PACERONE) 200 MG tablet Take 1 tablet (200 mg total) by mouth 2 (two) times daily. 09/01/14   [name], MD  amLODipine (NORVASC) 5 MG tablet Take 0.5 tablets (2.5 mg total) by mouth daily. 09/05/14   [name], MD  cholecalciferol (VITAMIN D) 1000 UNITS tablet Take 2,000 Units  by mouth daily.    Historical Provider, MD  divalproex (DEPAKOTE) 125 MG DR tablet Take 125 mg by mouth at bedtime.    Historical Provider, MD  levothyroxine (SYNTHROID, LEVOTHROID) 25 MCG tablet Take 25 mcg by mouth daily before breakfast.    Historical Provider, MD  LORazepam (ATIVAN) 0.5 MG tablet Take 0.25 mg by mouth daily.    Historical Provider, MD  LORazepam (ATIVAN) 0.5 MG tablet Take 0.25 mg by mouth every 8 (eight) hours as needed (agitation).    Historical Provider, MD  magnesium oxide (MAG-OX) 400 MG tablet Take 400 mg by mouth at bedtime.    Historical Provider, MD  oxyCODONE (OXY IR/ROXICODONE) 5 MG immediate release tablet Take 5 mg by mouth every 4 (four) hours as needed for severe pain.    Historical Provider, MD  oxyCODONE-acetaminophen (PERCOCET/ROXICET) 5-325 MG per tablet Take 1 tablet by mouth every 6 (six) hours as needed for moderate pain. Patient not taking: Reported on 11/07/2014 09/05/14   [name], MD  PARoxetine (PAXIL) 10 MG tablet Take 5 mg by mouth every morning.     Historical Provider, MD  saccharomyces boulardii (FLORASTOR) 250 MG capsule Take 250 mg by mouth 2 (two) times daily.    Historical Provider, MD  traMADol (ULTRAM) 50 MG tablet Take 0.5 tablets (25 mg total) by mouth 3 (three) times daily as needed (for pain). Patient not  taking: Reported on 11/07/2014 09/05/14   [name], MD  vitamin B-12 (CYANOCOBALAMIN) 1000 MCG tablet Take 1,000 mcg by mouth daily.    Historical Provider, MD   BP 150/61 mmHg  Pulse 62  Temp(Src) 98.6 F (37 C) (Oral)  Resp 20  SpO2 96% Physical Exam  Constitutional: She appears well-developed.  HENT:  Large hematoma in the left forehead eye movements intact.  Neck:  Range of motion intact but mild upper neck tenderness posteriorly.  Cardiovascular: Normal rate.   Pulmonary/Chest: Effort normal.  Musculoskeletal:  Some chronic tenderness to her left proximal shoulder. No tenderness otherwise on extremities.   Neurological: She is alert.  Somewhat confused but reportedly at her baseline.  Skin: Skin is warm.    ED Course  Procedures (including critical care time) Labs Review Labs Reviewed - No data to display  Imaging Review Ct Head Wo Contrast  12/17/2014   CLINICAL DATA:  Fall at nursing home today. Forehead hematoma. Initial encounter.  EXAM: CT HEAD WITHOUT CONTRAST  CT CERVICAL SPINE WITHOUT CONTRAST  TECHNIQUE: Multidetector CT imaging of the head and cervical spine was performed following the standard protocol without intravenous contrast. Multiplanar CT image reconstructions of the cervical spine were also generated.  COMPARISON:  11/07/2014.  FINDINGS: CT HEAD FINDINGS  No mass lesion, mass effect, midline shift, hydrocephalus, hemorrhage. No acute territorial cortical ischemia/infarct. Atrophy and chronic ischemic white matter disease is present. LEFT forehead/ scalp hematoma is present measuring 4.5 cm x 1.3 cm (image 20 series 201). There is no underlying skull fracture. The visible paranasal sinuses are within normal limits. Mastoid air cells are clear. Debris is present in both external auditory canals. Intracranial atherosclerosis. Chronic dilation of the LEFT temporal horn of the lateral ventricle.  CT CERVICAL SPINE FINDINGS  Alignment: Unchanged with multilevel degenerative anterolisthesis of the mid to lower cervical spine.  Craniocervical junction: Severe atlantodental degenerative disease. The odontoid is intact. Mild cranial settling with arthrosis between the tip of the odontoid and the clivus.  Vertebrae: There is a nondisplaced fracture of the LEFT C 2 lateral mass. This runs obliquely through the anterior lateral mass and exits the C2 foramen transversarium on the LEFT. No other fractures are identified.  Paraspinal soft tissues: Atherosclerosis.  Thyroid goiter.  Lung apices: Apical scarring.  No pneumothorax.  Severe multilevel degenerative disc and facet disease is present. There  is ankylosis of the RIGHT C2 through C4 facet joints, which probably contributes to the LEFT-sided C2 fracture through altered mechanics. Ossification of the C3-C4 disc is present. Multilevel foraminal stenosis is present.  IMPRESSION: 1. Atrophy and chronic ischemic white matter disease without acute intracranial abnormality. 2. LEFT forehead/frontal scalp hematoma. 3. Subtle anterior left C2 lateral mass fracture extending into the LEFT C2 foramen transversarium. This is best seen on the orthogonal reconstructed images (image 13-16, series 306). 4. Critical Value/emergent results were called by telephone at the time of interpretation on 12/17/2014 at 4:23 pm to Dr. Alvino Chapel, who verbally acknowledged these results.   Electronically Signed   By: Dereck Ligas M.D.   On: 12/17/2014 16:29   Ct Cervical Spine Wo Contrast  12/17/2014   CLINICAL DATA:  Fall at nursing home today. Forehead hematoma. Initial encounter.  EXAM: CT HEAD WITHOUT CONTRAST  CT CERVICAL SPINE WITHOUT CONTRAST  TECHNIQUE: Multidetector CT imaging of the head and cervical spine was performed following the standard protocol without intravenous contrast. Multiplanar CT image reconstructions of the cervical spine were also generated.  COMPARISON:  11/07/2014.  FINDINGS: CT HEAD FINDINGS  No mass lesion, mass effect, midline shift, hydrocephalus, hemorrhage. No acute territorial cortical ischemia/infarct. Atrophy and chronic ischemic white matter disease is present. LEFT forehead/ scalp hematoma is present measuring 4.5 cm x 1.3 cm (image 20 series 201). There is no underlying skull fracture. The visible paranasal sinuses are within normal limits. Mastoid air cells are clear. Debris is present in both external auditory canals. Intracranial atherosclerosis. Chronic dilation of the LEFT temporal horn of the lateral ventricle.  CT CERVICAL SPINE FINDINGS  Alignment: Unchanged with multilevel degenerative anterolisthesis of the mid to lower cervical  spine.  Craniocervical junction: Severe atlantodental degenerative disease. The odontoid is intact. Mild cranial settling with arthrosis between the tip of the odontoid and the clivus.  Vertebrae: There is a nondisplaced fracture of the LEFT C 2 lateral mass. This runs obliquely through the anterior lateral mass and exits the C2 foramen transversarium on the LEFT. No other fractures are identified.  Paraspinal soft tissues: Atherosclerosis.  Thyroid goiter.  Lung apices: Apical scarring.  No pneumothorax.  Severe multilevel degenerative disc and facet disease is present. There is ankylosis of the RIGHT C2 through C4 facet joints, which probably contributes to the LEFT-sided C2 fracture through altered mechanics. Ossification of the C3-C4 disc is present. Multilevel foraminal stenosis is present.  IMPRESSION: 1. Atrophy and chronic ischemic white matter disease without acute intracranial abnormality. 2. LEFT forehead/frontal scalp hematoma. 3. Subtle anterior left C2 lateral mass fracture extending into the LEFT C2 foramen transversarium. This is best seen on the orthogonal reconstructed images (image 13-16, series 306). 4. Critical Value/emergent results were called by telephone at the time of interpretation on 12/17/2014 at 4:23 pm to Dr. Alvino Chapel, who verbally acknowledged these results.   Electronically Signed   By: Dereck Ligas M.D.   On: 12/17/2014 16:29   I have personally reviewed and evaluated these images and lab results as part of my medical decision-making.   EKG Interpretation None      MDM   Final diagnoses:  Fall, initial encounter  C2 cervical fracture, closed, initial encounter    Patient with fall. Acute on chronic C2 fracture. Likely has been chronic and just slightly worsened. Discussed with Dr. Cyndy Freeze, who reviewed this and previous imaging. Recommended cervical collar patient tolerates well but the patient had are to take it off and was not tolerated. It is a stable fracture  and does not need the collar. Follow-up as needed. Patient's been off Coumadin for a week and will make an note some will not be restarted.    Davonna Belling, MD 12/17/14 [phone]

## 2014-12-17 NOTE — ED Notes (Signed)
Hard C-collar placed on pt

## 2014-12-17 NOTE — ED Notes (Signed)
Pt here for trip and fall at the nursing home today. Pt has hematoma to right forehead. sts was taken off blood thinners last week. sts some head and neck pain,.

## 2014-12-17 NOTE — Discharge Instructions (Signed)
There is no need to wear a cervical collar for this fracture. Stop taking Coumadin and do not restart it.  Cervical Spine Fracture, Stable A cervical spine fracture is a break or crack in one of the bones of the neck. A fracture is stable if the chances of it causing you problems while it is healing are very small. CAUSES   Vehicle accidents.  Injuries from sports such as diving, football, biking, wrestling, or skiing.  Occasionally, severe osteoporosis or other bone diseases, such as cancers that spread to bone or metabolic abnormalities. SYMPTOMS   Severe neck pain after an accident or fall.  Pain down your shoulders or arms.  Bruising or swelling on the back of your neck.  Numbness, tingling, muscle spasm, or weakness. DIAGNOSIS  Cervical spine fracture is diagnosed with the help of X-ray exams of your neck. Often a CT scan or MRI is used to confirm the diagnosis and help determine how your injury should be treated. Generally, an examination of your neck, arms, and legs, and the history of your injury prompts the health care provider to order these tests.  TREATMENT  A stable fracture needs to be treated with a brace or cervical collar. A cervical collar is a two-piece collar designed to keep your neck from moving during the healing process. HOME CARE INSTRUCTIONS  Limit physical activity to prevent worsening of the fracture.  Apply ice to areas of pain 3-4 times a day for 2 days.  Put ice in a bag.  Place a towel between your skin and the bag.  Leave the ice on for 15-20 minutes, 3-4 times a day.  You may have been given a cervical collar to wear.  Do not remove the collar unless instructed by your health care provider.  If you have long hair, keep it outside of the collar.  Ask your health care provider before making any adjustments to your collar. Minor adjustments may be required over time to improve comfort and reduce pressure on your chin or on the back of your  head.  Keep your collar clean by wiping it with mild soap and water and drying it completely. The pads can be hand washed with soap and water and air dried completely.  If you are allowed to remove the collar for cleaning or bathing, follow your health care provider's instructions on how to do so safely.  If you are allowed to remove the collar for cleaning and bathing, wash and dry the skin of your neck. Check your skin for irritation or sores. If you see any, tell your health care provider.  Only take over-the-counter or prescription medicines for pain, discomfort, or fever as directed by your health care provider.   Keep all follow-up appointments as directed by your health care provider. Not keeping an appointment could result in a chronic or permanent injury, pain, and disability. Additionally, X-rays or an MRI may be repeated 1-3 weeks after your initial appointment. This is to:  Make sure any other breaks or cracks were not missed.   Help identify stretched or torn ligaments.   Get your test results if you did not get them when you were first evaluated. The results will determine whether you need other tests or treatment. It is your responsibility to get the results. SEEK MEDICAL CARE IF: You have irritation or sores on your skin from the cervical collar. SEEK IMMEDIATE MEDICAL CARE IF:   You have increasing pain in your neck.   You develop  difficulties swallowing or breathing.  You develop swelling in your neck.   You have numbness, weakness, burning pain, or movement problems in the arms or legs.   You are unable to control your bowel or bladder (incontinence).   You have problems with coordination or difficulty walking. MAKE SURE YOU:   Understand these instructions.  Will watch your condition.  Will get help right away if you are not doing well or get worse. Document Released: 01/25/2004 Document Revised: 03/14/2013 Document Reviewed: 10/03/2012 [facility]  Patient Information 2015 [place], [place]. This information is not intended to replace advice given to you by your health care provider. Make sure you discuss any questions you have with your health care provider.

## 2014-12-27 ENCOUNTER — Encounter (HOSPITAL_COMMUNITY): Payer: Self-pay | Admitting: Emergency Medicine

## 2014-12-27 ENCOUNTER — Emergency Department (HOSPITAL_COMMUNITY): Payer: Medicare Other

## 2014-12-27 ENCOUNTER — Emergency Department (HOSPITAL_COMMUNITY)
Admission: EM | Admit: 2014-12-27 | Discharge: 2014-12-27 | Disposition: A | Discharge status: Skilled Nursing Facility | Payer: Medicare Other | Attending: Emergency Medicine | Admitting: Emergency Medicine

## 2014-12-27 DIAGNOSIS — S4992XA Unspecified injury of left shoulder and upper arm, initial encounter: Secondary | ICD-10-CM | POA: Diagnosis not present

## 2014-12-27 DIAGNOSIS — F0390 Unspecified dementia without behavioral disturbance: Secondary | ICD-10-CM | POA: Insufficient documentation

## 2014-12-27 DIAGNOSIS — Z8619 Personal history of other infectious and parasitic diseases: Secondary | ICD-10-CM | POA: Insufficient documentation

## 2014-12-27 DIAGNOSIS — W1839XA Other fall on same level, initial encounter: Secondary | ICD-10-CM | POA: Diagnosis not present

## 2014-12-27 DIAGNOSIS — E079 Disorder of thyroid, unspecified: Secondary | ICD-10-CM | POA: Diagnosis not present

## 2014-12-27 DIAGNOSIS — I10 Essential (primary) hypertension: Secondary | ICD-10-CM | POA: Diagnosis not present

## 2014-12-27 DIAGNOSIS — W19XXXA Unspecified fall, initial encounter: Secondary | ICD-10-CM

## 2014-12-27 DIAGNOSIS — Z79899 Other long term (current) drug therapy: Secondary | ICD-10-CM | POA: Insufficient documentation

## 2014-12-27 DIAGNOSIS — Y9389 Activity, other specified: Secondary | ICD-10-CM | POA: Diagnosis not present

## 2014-12-27 DIAGNOSIS — R410 Disorientation, unspecified: Secondary | ICD-10-CM | POA: Insufficient documentation

## 2014-12-27 DIAGNOSIS — S0181XA Laceration without foreign body of other part of head, initial encounter: Secondary | ICD-10-CM

## 2014-12-27 DIAGNOSIS — Z87448 Personal history of other diseases of urinary system: Secondary | ICD-10-CM | POA: Insufficient documentation

## 2014-12-27 DIAGNOSIS — Y998 Other external cause status: Secondary | ICD-10-CM | POA: Diagnosis not present

## 2014-12-27 DIAGNOSIS — Y92129 Unspecified place in nursing home as the place of occurrence of the external cause: Secondary | ICD-10-CM | POA: Insufficient documentation

## 2014-12-27 DIAGNOSIS — S0083XA Contusion of other part of head, initial encounter: Secondary | ICD-10-CM | POA: Diagnosis not present

## 2014-12-27 DIAGNOSIS — S0990XA Unspecified injury of head, initial encounter: Secondary | ICD-10-CM | POA: Diagnosis present

## 2014-12-27 NOTE — ED Provider Notes (Signed)
CSN: [id number]     Arrival date & time 12/27/14  0536 History   First MD Initiated Contact with Patient 12/27/14 0601     Chief Complaint  Patient presents with  . Fall    HPI  Patient presents after a fall per Patient is a nursing home resident, with a history of dementia, level V caveat. Per report, the patient has frequent falls. Today, just prior to my evaluation she was found on the ground at her facility, with hematoma on the right forehead, as well as previous injury to the left forehead. EMS reports immobilizing the patient's neck with a towel. No report of new changes in behavior, diet, medication beyond cessation of blood thinning medication about 2 weeks ago.   Past Medical History  Diagnosis Date  . Hypertension   . Shingles   . Thyroid disease   . Dementia   . Thyroid activity decreased   . Fall at nursing home   . Syncope   . Humeral fracture 09/03/2014  . Renal insufficiency   . TBI (traumatic brain injury) [facility])    Past Surgical History  Procedure Laterality Date  . Abdominal hysterectomy    . Cataract extraction     History reviewed. No pertinent family history. Social History  Substance Use Topics  . Smoking status: Never Smoker   . Smokeless tobacco: Never Used  . Alcohol Use: No   OB History    No data available     Review of Systems  Unable to perform ROS: Dementia      Allergies  Eggs or egg-derived products  Home Medications   Prior to Admission medications   Medication Sig Start Date End Date Taking? Authorizing Provider  amiodarone (PACERONE) 200 MG tablet Take 200 mg by mouth 2 (two) times daily.   Yes Historical Provider, MD  cholecalciferol (VITAMIN D) 1000 UNITS tablet Take 2,000 Units by mouth daily.   Yes Historical Provider, MD  divalproex (DEPAKOTE SPRINKLE) 125 MG capsule Take 125 mg by mouth 2 (two) times daily.   Yes Historical Provider, MD  levothyroxine (SYNTHROID, LEVOTHROID) 25 MCG tablet Take 25 mcg by mouth daily  before breakfast.   Yes Historical Provider, MD  LORazepam (ATIVAN) 0.5 MG tablet Take 0.25 mg by mouth every 8 (eight) hours as needed (agitation).    Yes Historical Provider, MD  LORazepam (ATIVAN) 0.5 MG tablet Take 0.25 mg by mouth 2 (two) times daily.    Yes Historical Provider, MD  oxyCODONE (OXY IR/ROXICODONE) 5 MG immediate release tablet Take 5 mg by mouth every 4 (four) hours as needed for severe pain.   Yes Historical Provider, MD  sertraline (ZOLOFT) 50 MG tablet Take 50 mg by mouth daily.   Yes Historical Provider, MD  amLODipine (NORVASC) 5 MG tablet Take 0.5 tablets (2.5 mg total) by mouth daily. Patient not taking: Reported on 12/27/2014 09/05/14   [name], MD   BP 169/69 mmHg  Pulse 69  Temp(Src) 97.3 F (36.3 C) (Oral)  Resp 19  SpO2 94% Physical Exam  Constitutional: She has a sickly appearance.  HENT:  Large hematoma in the left forehead  R forehead laceration 5cm oval shaped w flap on medial aspect - active oozing  Neck: Muscular tenderness present. Decreased range of motion present.  Cardiovascular: Normal rate.   Pulmonary/Chest: Effort normal.  Musculoskeletal:  Some chronic tenderness to her left proximal shoulder. No tenderness otherwise on extremities.  Neurological: She is alert. She displays atrophy. She displays no  tremor. She displays no seizure activity.  Confused  Skin: Skin is warm.  Psychiatric: Cognition and memory are impaired.    ED Course  Procedures (including critical care time) Labs Review Labs Reviewed - No data to display  Imaging Review Ct Head Wo Contrast  12/27/2014   CLINICAL DATA:  Status post unwitnessed fall, with laceration and hematoma at the left forehead. Concern for cervical spine injury. Initial encounter.  EXAM: CT HEAD WITHOUT CONTRAST  CT CERVICAL SPINE WITHOUT CONTRAST  TECHNIQUE: Multidetector CT imaging of the head and cervical spine was performed following the standard protocol without intravenous contrast.  Multiplanar CT image reconstructions of the cervical spine were also generated.  COMPARISON:  CT of the head and cervical spine performed 12/17/2014  FINDINGS: CT HEAD FINDINGS  There is no evidence of acute infarction, mass lesion, or intra- or extra-axial hemorrhage on CT.  Prominence of the ventricles and sulci reflects moderate cortical volume loss. Cerebellar atrophy is noted. Scattered periventricular white matter change likely reflects small vessel ischemic microangiopathy. A small chronic infarct is noted at the left occipital lobe. A chronic lacunar infarct is seen at the inferior right basal ganglia.  The brainstem and fourth ventricle are within normal limits. No mass effect or midline shift is seen.  There is no evidence of fracture; visualized osseous structures are unremarkable in appearance. The orbits are within normal limits. The paranasal sinuses and mastoid air cells are well-aerated. Soft tissue swelling is noted overlying the left frontal calvarium.  CT CERVICAL SPINE FINDINGS  There is no evidence of acute fracture or subluxation. There is grade 1 anterolisthesis of C4 on C5, of C5 on C6, of C7 on T1, and of T1 on T2. Underlying facet disease is noted. Vertebral bodies demonstrate normal height. Intervertebral disc spaces are grossly preserved. Prevertebral soft tissues are within normal limits.  Diffuse prominence of the thyroid gland, with associated nodularity, likely reflects thyroid goiter. The visualized lung apices are clear. Mild calcification is noted at the carotid bifurcations bilaterally.  IMPRESSION: 1. No evidence of traumatic intracranial injury or fracture. 2. No evidence of acute fracture or subluxation along the cervical spine. 3. Soft tissue swelling overlying the left frontal calvarium. 4. Moderate cortical volume loss and scattered small vessel ischemic microangiopathy. 5. Small chronic infarct at the left occipital lobe. Chronic lacunar infarct at the inferior right basal  ganglia. 6. Mild diffuse degenerative change along the cervical spine. 7. Diffuse prominence of the thyroid gland, with associated nodularity, likely reflects thyroid goiter. 8. Mild calcification at the carotid bifurcations bilaterally.   Electronically Signed   By: Garald Balding M.D.   On: 12/27/2014 06:55   Ct Cervical Spine Wo Contrast  12/27/2014   CLINICAL DATA:  Status post unwitnessed fall, with laceration and hematoma at the left forehead. Concern for cervical spine injury. Initial encounter.  EXAM: CT HEAD WITHOUT CONTRAST  CT CERVICAL SPINE WITHOUT CONTRAST  TECHNIQUE: Multidetector CT imaging of the head and cervical spine was performed following the standard protocol without intravenous contrast. Multiplanar CT image reconstructions of the cervical spine were also generated.  COMPARISON:  CT of the head and cervical spine performed 12/17/2014  FINDINGS: CT HEAD FINDINGS  There is no evidence of acute infarction, mass lesion, or intra- or extra-axial hemorrhage on CT.  Prominence of the ventricles and sulci reflects moderate cortical volume loss. Cerebellar atrophy is noted. Scattered periventricular white matter change likely reflects small vessel ischemic microangiopathy. A small chronic infarct is noted at  the left occipital lobe. A chronic lacunar infarct is seen at the inferior right basal ganglia.  The brainstem and fourth ventricle are within normal limits. No mass effect or midline shift is seen.  There is no evidence of fracture; visualized osseous structures are unremarkable in appearance. The orbits are within normal limits. The paranasal sinuses and mastoid air cells are well-aerated. Soft tissue swelling is noted overlying the left frontal calvarium.  CT CERVICAL SPINE FINDINGS  There is no evidence of acute fracture or subluxation. There is grade 1 anterolisthesis of C4 on C5, of C5 on C6, of C7 on T1, and of T1 on T2. Underlying facet disease is noted. Vertebral bodies demonstrate  normal height. Intervertebral disc spaces are grossly preserved. Prevertebral soft tissues are within normal limits.  Diffuse prominence of the thyroid gland, with associated nodularity, likely reflects thyroid goiter. The visualized lung apices are clear. Mild calcification is noted at the carotid bifurcations bilaterally.  IMPRESSION: 1. No evidence of traumatic intracranial injury or fracture. 2. No evidence of acute fracture or subluxation along the cervical spine. 3. Soft tissue swelling overlying the left frontal calvarium. 4. Moderate cortical volume loss and scattered small vessel ischemic microangiopathy. 5. Small chronic infarct at the left occipital lobe. Chronic lacunar infarct at the inferior right basal ganglia. 6. Mild diffuse degenerative change along the cervical spine. 7. Diffuse prominence of the thyroid gland, with associated nodularity, likely reflects thyroid goiter. 8. Mild calcification at the carotid bifurcations bilaterally.   Electronically Signed   By: Garald Balding M.D.   On: 12/27/2014 06:55   I have personally reviewed and evaluated these images and lab results as part of my medical decision-making.  LACERATION REPAIR Performed by: Carmin Muskrat Authorized by: Carmin Muskrat Consent: Verbal consent obtained. Risks and benefits: risks, benefits and alternatives were discussed Consent given by: patient Patient identity confirmed: provided demographic data Prepped and Draped in normal sterile fashion Wound explored  Laceration Location: R forehead   Laceration Length: 5cm - oval shaped  No Foreign Bodies seen or palpated  Irrigation method: syringe Amount of cleaning: standard  Skin closure: 2 1/2 inch steri-strips  Number of sutures: 2  Technique: close as possible.  Patient tolerance: Patient tolerated the procedure well with no immediate complications.   MDM  Elderly female presents after an unwitnessed fall. Patient seemingly at baseline  neurologically, and radiographic studies are reassuring. Patient had uncomplicated wound repair, was discharged in stable condition to her nursing facility.  Carmin Muskrat, MD 12/27/14 [phone]

## 2014-12-27 NOTE — ED Notes (Signed)
Per EMS pt fell at [facility]  Pt has a hematoma to her right side of her forehead, bleeding controlled  Pt has small rash on her lower back from the carpet  Pt has dementia  EMS wrapped a towel around her neck and bandaged her forehead  Fall was unwitnessed  No LOC  Pt denies neck or back pain  Pt denies any pain at this time  Pt is not on any type of blood thinners

## 2014-12-27 NOTE — Discharge Instructions (Signed)
Your sterile wound closure strips will fall off when it is appropriate.  You may lightly rinse the wound.  Return here for concerning changes in your condition.   Laceration Care, Adult A laceration is a cut that goes through all layers of the skin. The cut also goes into the tissue that is right under the skin. Some cuts heal on their own. Others need to be closed with stitches (sutures), staples, skin adhesive strips, or wound glue. Taking care of your cut lowers your risk of infection and helps your cut to heal better. HOW TO TAKE CARE OF YOUR CUT For stitches or staples:  Keep the wound clean and dry.  If you were given a bandage (dressing), you should change it at least one time per day or as told by your doctor. You should also change it if it gets wet or dirty.  Keep the wound completely dry for the first 24 hours or as told by your doctor. After that time, you may take a shower or a bath. However, make sure that the wound is not soaked in water until after the stitches or staples have been removed.  Clean the wound one time each day or as told by your doctor:  Wash the wound with soap and water.  Rinse the wound with water until all of the soap comes off.  Pat the wound dry with a clean towel. Do not rub the wound.  After you clean the wound, put a thin layer of antibiotic ointment on it as told by your doctor. This ointment:  Helps to prevent infection.  Keeps the bandage from sticking to the wound.  Have your stitches or staples removed as told by your doctor. If your doctor used skin adhesive strips:   Keep the wound clean and dry.  If you were given a bandage, you should change it at least one time per day or as told by your doctor. You should also change it if it gets dirty or wet.  Do not get the skin adhesive strips wet. You can take a shower or a bath, but be careful to keep the wound dry.  If the wound gets wet, pat it dry with a clean towel. Do not rub the  wound.  Skin adhesive strips fall off on their own. You can trim the strips as the wound heals. Do not remove any strips that are still stuck to the wound. They will fall off after a while. If your doctor used wound glue:  Try to keep your wound dry, but you may briefly wet it in the shower or bath. Do not soak the wound in water, such as by swimming.  After you take a shower or a bath, gently pat the wound dry with a clean towel. Do not rub the wound.  Do not do any activities that will make you really sweaty until the skin glue has fallen off on its own.  Do not apply liquid, cream, or ointment medicine to your wound while the skin glue is still on.  If you were given a bandage, you should change it at least one time per day or as told by your doctor. You should also change it if it gets dirty or wet.  If a bandage is placed over the wound, do not let the tape for the bandage touch the skin glue.  Do not pick at the glue. The skin glue usually stays on for 5-10 days. Then, it falls off of  the skin. General Instructions  To help prevent scarring, make sure to cover your wound with sunscreen whenever you are outside after stitches are removed, after adhesive strips are removed, or when wound glue stays in place and the wound is healed. Make sure to wear a sunscreen of at least 30 SPF.  Take over-the-counter and prescription medicines only as told by your doctor.  If you were given antibiotic medicine or ointment, take or apply it as told by your doctor. Do not stop using the antibiotic even if your wound is getting better.  Do not scratch or pick at the wound.  Keep all follow-up visits as told by your doctor. This is important.  Check your wound every day for signs of infection. Watch for:  Redness, swelling, or pain.  Fluid, blood, or pus.  Raise (elevate) the injured area above the level of your heart while you are sitting or lying down, if possible. GET HELP IF:  You got a  tetanus shot and you have any of these problems at the injection site:  Swelling.  Very bad pain.  Redness.  Bleeding.  You have a fever.  A wound that was closed breaks open.  You notice a bad smell coming from your wound or your bandage.  You notice something coming out of the wound, such as wood or glass.  Medicine does not help your pain.  You have more redness, swelling, or pain at the site of your wound.  You have fluid, blood, or pus coming from your wound.  You notice a change in the color of your skin near your wound.  You need to change the bandage often because fluid, blood, or pus is coming from the wound.  You start to have a new rash.  You start to have numbness around the wound. GET HELP RIGHT AWAY IF:  You have very bad swelling around the wound.  Your pain suddenly gets worse and is very bad.  You notice painful lumps near the wound or on skin that is anywhere on your body.  You have a red streak going away from your wound.  The wound is on your hand or foot and you cannot move a finger or toe like you usually can.  The wound is on your hand or foot and you notice that your fingers or toes look pale or bluish.   This information is not intended to replace advice given to you by your health care provider. Make sure you discuss any questions you have with your health care provider.   Document Released: 08/26/2007 Document Revised: 07/24/2014 Document Reviewed: 03/05/2014 Elsevier Interactive Patient Education [employer].

## 2014-12-27 NOTE — ED Notes (Signed)
Returned from CT.

## 2014-12-27 NOTE — ED Notes (Signed)
Patient transported to CT 

## 2014-12-27 NOTE — ED Notes (Signed)
[facility] called to transport pt back to facility 

## 2015-01-05 ENCOUNTER — Encounter (HOSPITAL_COMMUNITY): Payer: Self-pay | Admitting: Emergency Medicine

## 2015-01-05 ENCOUNTER — Emergency Department (HOSPITAL_COMMUNITY): Payer: Medicare Other

## 2015-01-05 ENCOUNTER — Emergency Department (HOSPITAL_COMMUNITY)
Admission: EM | Admit: 2015-01-05 | Discharge: 2015-01-05 | Disposition: A | Discharge status: Home / Self Care | Payer: Medicare Other | Attending: Emergency Medicine | Admitting: Emergency Medicine

## 2015-01-05 DIAGNOSIS — W19XXXA Unspecified fall, initial encounter: Secondary | ICD-10-CM

## 2015-01-05 DIAGNOSIS — S0001XA Abrasion of scalp, initial encounter: Secondary | ICD-10-CM | POA: Insufficient documentation

## 2015-01-05 DIAGNOSIS — Z79899 Other long term (current) drug therapy: Secondary | ICD-10-CM | POA: Diagnosis not present

## 2015-01-05 DIAGNOSIS — Z87820 Personal history of traumatic brain injury: Secondary | ICD-10-CM | POA: Diagnosis not present

## 2015-01-05 DIAGNOSIS — Y998 Other external cause status: Secondary | ICD-10-CM | POA: Insufficient documentation

## 2015-01-05 DIAGNOSIS — I10 Essential (primary) hypertension: Secondary | ICD-10-CM | POA: Diagnosis not present

## 2015-01-05 DIAGNOSIS — Z87828 Personal history of other (healed) physical injury and trauma: Secondary | ICD-10-CM | POA: Insufficient documentation

## 2015-01-05 DIAGNOSIS — S0083XA Contusion of other part of head, initial encounter: Secondary | ICD-10-CM | POA: Insufficient documentation

## 2015-01-05 DIAGNOSIS — Y92128 Other place in nursing home as the place of occurrence of the external cause: Secondary | ICD-10-CM | POA: Insufficient documentation

## 2015-01-05 DIAGNOSIS — Z87448 Personal history of other diseases of urinary system: Secondary | ICD-10-CM | POA: Insufficient documentation

## 2015-01-05 DIAGNOSIS — W1839XA Other fall on same level, initial encounter: Secondary | ICD-10-CM | POA: Insufficient documentation

## 2015-01-05 DIAGNOSIS — Z8619 Personal history of other infectious and parasitic diseases: Secondary | ICD-10-CM | POA: Insufficient documentation

## 2015-01-05 DIAGNOSIS — E039 Hypothyroidism, unspecified: Secondary | ICD-10-CM | POA: Insufficient documentation

## 2015-01-05 DIAGNOSIS — Y9301 Activity, walking, marching and hiking: Secondary | ICD-10-CM | POA: Insufficient documentation

## 2015-01-05 DIAGNOSIS — F0390 Unspecified dementia without behavioral disturbance: Secondary | ICD-10-CM | POA: Diagnosis not present

## 2015-01-05 LAB — CBC WITH DIFFERENTIAL/PLATELET
BASOS ABS: 0 10*3/uL (ref 0.0–0.1)
Basophils Relative: 0 %
EOS PCT: 2 %
Eosinophils Absolute: 0.2 10*3/uL (ref 0.0–0.7)
HCT: 42.3 % (ref 36.0–46.0)
Hemoglobin: 13.4 g/dL (ref 12.0–15.0)
LYMPHS PCT: 27 %
Lymphs Abs: 2.5 10*3/uL (ref 0.7–4.0)
MCH: 26.5 pg (ref 26.0–34.0)
MCHC: 31.7 g/dL (ref 30.0–36.0)
MCV: 83.6 fL (ref 78.0–100.0)
MONO ABS: 1 10*3/uL (ref 0.1–1.0)
Monocytes Relative: 10 %
Neutro Abs: 5.8 10*3/uL (ref 1.7–7.7)
Neutrophils Relative %: 61 %
Platelets: 250 10*3/uL (ref 150–400)
RBC: 5.06 MIL/uL (ref 3.87–5.11)
RDW: 17 % — AB (ref 11.5–15.5)
WBC: 9.5 10*3/uL (ref 4.0–10.5)

## 2015-01-05 LAB — BASIC METABOLIC PANEL
ANION GAP: 9 (ref 5–15)
BUN: 20 mg/dL (ref 6–20)
CALCIUM: 9.1 mg/dL (ref 8.9–10.3)
CHLORIDE: 99 mmol/L — AB (ref 101–111)
CO2: 31 mmol/L (ref 22–32)
Creatinine, Ser: 1.11 mg/dL — ABNORMAL HIGH (ref 0.44–1.00)
GFR calc non Af Amer: 43 mL/min — ABNORMAL LOW (ref >60)
GFR, EST AFRICAN AMERICAN: 50 mL/min — AB (ref >60)
GLUCOSE: 121 mg/dL — AB (ref 65–99)
POTASSIUM: 3.8 mmol/L (ref 3.5–5.1)
Sodium: 139 mmol/L (ref 135–145)

## 2015-01-05 MED ORDER — FENTANYL CITRATE (PF) 100 MCG/2ML IJ SOLN: 50.0000 ug | Freq: Once | INTRAMUSCULAR | Status: DC

## 2015-01-05 NOTE — Discharge Instructions (Signed)
Fall Prevention in the Home  Ms. Gauer, your CT scan results are below.  See a primary care doctor within 3 days for close follow up.  If any symptoms worsen come back to the emergency department immediately. Thank you.  IMPRESSION: 1. No acute intracranial pathology. 2. No acute osseous injury of the cervical spine. 3. Cervical spine spondylosis as described above. 4. Again noted is a subtle nondisplaced fracture involving the left lateral mass of C2 extending into the foramen transverse transversarium.   Falls can cause injuries. They can happen to people of all ages. There are many things you can do to make your home safe and to help prevent falls.  WHAT CAN I DO ON THE OUTSIDE OF MY HOME?  Regularly fix the edges of walkways and driveways and fix any cracks.  Remove anything that might make you trip as you walk through a door, such as a raised step or threshold.  Trim any bushes or trees on the path to your home.  Use bright outdoor lighting.  Clear any walking paths of anything that might make someone trip, such as rocks or tools.  Regularly check to see if handrails are loose or broken. Make sure that both sides of any steps have handrails.  Any raised decks and porches should have guardrails on the edges.  Have any leaves, snow, or ice cleared regularly.  Use sand or salt on walking paths during winter.  Clean up any spills in your garage right away. This includes oil or grease spills. WHAT CAN I DO IN THE BATHROOM?   Use night lights.  Install grab bars by the toilet and in the tub and shower. Do not use towel bars as grab bars.  Use non-skid mats or decals in the tub or shower.  If you need to sit down in the shower, use a plastic, non-slip stool.  Keep the floor dry. Clean up any water that spills on the floor as soon as it happens.  Remove soap buildup in the tub or shower regularly.  Attach bath mats securely with double-sided non-slip rug tape.  Do  not have throw rugs and other things on the floor that can make you trip. WHAT CAN I DO IN THE BEDROOM?  Use night lights.  Make sure that you have a light by your bed that is easy to reach.  Do not use any sheets or blankets that are too big for your bed. They should not hang down onto the floor.  Have a firm chair that has side arms. You can use this for support while you get dressed.  Do not have throw rugs and other things on the floor that can make you trip. WHAT CAN I DO IN THE KITCHEN?  Clean up any spills right away.  Avoid walking on wet floors.  Keep items that you use a lot in easy-to-reach places.  If you need to reach something above you, use a strong step stool that has a grab bar.  Keep electrical cords out of the way.  Do not use floor polish or wax that makes floors slippery. If you must use wax, use non-skid floor wax.  Do not have throw rugs and other things on the floor that can make you trip. WHAT CAN I DO WITH MY STAIRS?  Do not leave any items on the stairs.  Make sure that there are handrails on both sides of the stairs and use them. Fix handrails that are broken or loose.  Make sure that handrails are as long as the stairways.  Check any carpeting to make sure that it is firmly attached to the stairs. Fix any carpet that is loose or worn.  Avoid having throw rugs at the top or bottom of the stairs. If you do have throw rugs, attach them to the floor with carpet tape.  Make sure that you have a light switch at the top of the stairs and the bottom of the stairs. If you do not have them, ask someone to add them for you. WHAT ELSE CAN I DO TO HELP PREVENT FALLS?  Wear shoes that:  Do not have high heels.  Have rubber bottoms.  Are comfortable and fit you well.  Are closed at the toe. Do not wear sandals.  If you use a stepladder:  Make sure that it is fully opened. Do not climb a closed stepladder.  Make sure that both sides of the stepladder  are locked into place.  Ask someone to hold it for you, if possible.  Clearly mark and make sure that you can see:  Any grab bars or handrails.  First and last steps.  Where the edge of each step is.  Use tools that help you move around (mobility aids) if they are needed. These include:  Canes.  Walkers.  Scooters.  Crutches.  Turn on the lights when you go into a dark area. Replace any light bulbs as soon as they burn out.  Set up your furniture so you have a clear path. Avoid moving your furniture around.  If any of your floors are uneven, fix them.  If there are any pets around you, be aware of where they are.  Review your medicines with your doctor. Some medicines can make you feel dizzy. This can increase your chance of falling. Ask your doctor what other things that you can do to help prevent falls.   This information is not intended to replace advice given to you by your health care provider. Make sure you discuss any questions you have with your health care provider.   Document Released: 01/03/2009 Document Revised: 07/24/2014 Document Reviewed: 04/13/2014 Elsevier Interactive Patient Education [employer].

## 2015-01-05 NOTE — ED Notes (Signed)
Per EMS pt fell walking across the dining room floor around 2300. Per EMS witnessed mechanical fall no LOC. Pt is oriented to self. Moderate bleeding of hematoma at occipital region of head. NAD noted.

## 2015-01-05 NOTE — ED Provider Notes (Signed)
CSN: [id number]     Arrival date & time 01/05/15  0030 History   By signing my name below, I, Tracy Reeves, attest that this documentation has been prepared under the direction and in the presence of [name], MD. Electronically Signed: [name], ED Scribe. 01/05/2015. 1:06 AM.      Chief Complaint  Patient presents with  . Fall   Patient is a 81 y.o. female presenting with fall. The history is provided by the EMS personnel. No language interpreter was used.  Fall This is a new problem. The current episode started 1 to 2 hours ago. The problem occurs every several days. She has tried nothing for the symptoms.   HPI Comments: Level 5 Caveat: Dementia  Tracy Reeves is a 81 y.o. female brought in by ambulance, who presents to the Emergency Department complaining of fall onset tonight PTA. Per Ems pt fell walking across the hall at nursing homes. States that she did fall landing on the posterior aspect of her head. Pt presents with a small abrasion to the posterior aspect of her head. Denies no LOC, neck pain, back pain. Pt is not on any anticoagulants. Pt does have a Hx of falling and recently fell 9 days prior.     Past Medical History  Diagnosis Date  . Hypertension   . Shingles   . Thyroid disease   . Dementia   . Thyroid activity decreased   . Fall at nursing home   . Syncope   . Humeral fracture 09/03/2014  . Renal insufficiency   . TBI (traumatic brain injury) [facility])    Past Surgical History  Procedure Laterality Date  . Abdominal hysterectomy    . Cataract extraction     No family history on file. Social History  Substance Use Topics  . Smoking status: Never Smoker   . Smokeless tobacco: Never Used  . Alcohol Use: No   OB History    No data available     Review of Systems  Unable to perform ROS: Dementia  Skin: Positive for wound.  Neurological: Negative for syncope.      Allergies  Eggs or egg-derived products  Home Medications   Prior to  Admission medications   Medication Sig Start Date End Date Taking? Authorizing Provider  amiodarone (PACERONE) 200 MG tablet Take 200 mg by mouth 2 (two) times daily.    Historical Provider, MD  amLODipine (NORVASC) 5 MG tablet Take 0.5 tablets (2.5 mg total) by mouth daily. Patient not taking: Reported on 12/27/2014 09/05/14   [name], MD  cholecalciferol (VITAMIN D) 1000 UNITS tablet Take 2,000 Units by mouth daily.    Historical Provider, MD  divalproex (DEPAKOTE SPRINKLE) 125 MG capsule Take 125 mg by mouth 2 (two) times daily.    Historical Provider, MD  levothyroxine (SYNTHROID, LEVOTHROID) 25 MCG tablet Take 25 mcg by mouth daily before breakfast.    Historical Provider, MD  LORazepam (ATIVAN) 0.5 MG tablet Take 0.25 mg by mouth every 8 (eight) hours as needed (agitation).     Historical Provider, MD  LORazepam (ATIVAN) 0.5 MG tablet Take 0.25 mg by mouth 2 (two) times daily.     Historical Provider, MD  oxyCODONE (OXY IR/ROXICODONE) 5 MG immediate release tablet Take 5 mg by mouth every 4 (four) hours as needed for severe pain.    Historical Provider, MD  sertraline (ZOLOFT) 50 MG tablet Take 50 mg by mouth daily.    Historical Provider, MD   BP  199/63 mmHg  Pulse 60  Temp(Src) 98.5 F (36.9 C) (Oral)  Resp 21  SpO2 97%   Physical Exam  Constitutional: She appears well-developed and well-nourished. No distress.  HENT:  Head: Normocephalic.  Nose: Nose normal.  Mouth/Throat: Oropharynx is clear and moist. No oropharyngeal exudate.  Old left front hematoma 2cm New left post scalp abrasion 1 cm  Eyes: Conjunctivae and EOM are normal. No scleral icterus.  Left pupil 86mm and reactive right pupil 22mm and reactive   Neck: Normal range of motion. Neck supple. No JVD present. No tracheal deviation present. No thyromegaly present.  Cardiovascular: Normal rate, regular rhythm and normal heart sounds.  Exam reveals no gallop and no friction rub.   No murmur heard. Pulmonary/Chest:  Effort normal and breath sounds normal. No respiratory distress. She has no wheezes. She exhibits no tenderness.  Abdominal: Soft. Bowel sounds are normal. She exhibits no distension and no mass. There is no tenderness. There is no rebound and no guarding.  Musculoskeletal: Normal range of motion. She exhibits no edema or tenderness.  Lymphadenopathy:    She has no cervical adenopathy.  Neurological: She is alert.  Skin: Skin is warm and dry. No rash noted. No erythema. No pallor.  Nursing note and vitals reviewed.   ED Course  Procedures (including critical care time) DIAGNOSTIC STUDIES: Oxygen Saturation is 97% on room air, normal by my interpretation.    COORDINATION OF CARE:    Labs Review Labs Reviewed  CBC WITH DIFFERENTIAL/PLATELET - Abnormal; Notable for the following:    RDW 17.0 (*)    All other components within normal limits  BASIC METABOLIC PANEL - Abnormal; Notable for the following:    Chloride 99 (*)    Glucose, Bld 121 (*)    Creatinine, Ser 1.11 (*)    GFR calc non Af Amer 43 (*)    GFR calc Af Amer 50 (*)    All other components within normal limits    Imaging Review Ct Head Wo Contrast  01/05/2015  CLINICAL DATA:  Dementia, fell several days ago EXAM: CT HEAD WITHOUT CONTRAST CT CERVICAL SPINE WITHOUT CONTRAST TECHNIQUE: Multidetector CT imaging of the head and cervical spine was performed following the standard protocol without intravenous contrast. Multiplanar CT image reconstructions of the cervical spine were also generated. COMPARISON:  07/30/2014, 08/31/2014, 09/03/2014, 09/28/2014, 11/07/2014, 12/17/2014, 12/27/2014 FINDINGS: CT HEAD FINDINGS There is no evidence of mass effect, midline shift, or extra-axial fluid collections. There is no evidence of a space-occupying lesion or intracranial hemorrhage. There is no evidence of a cortical-based area of acute infarction. There is generalized cerebral atrophy. There is periventricular white matter low  attenuation likely secondary to microangiopathy. The ventricles and sulci are appropriate for the patient's age. The basal cisterns are patent. Visualized portions of the orbits are unremarkable. The visualized portions of the paranasal sinuses and mastoid air cells are unremarkable. Cerebrovascular atherosclerotic calcifications are noted. The osseous structures are unremarkable. There is a left frontal and left parietal scalp hematoma. CT CERVICAL SPINE FINDINGS The alignment is anatomic. The vertebral body heights are maintained. Again noted is a subtle nondisplaced fracture involving the left lateral mass of C2 extending into the foramen transverse transversarium. There is no acute fracture. There is no static listhesis. The prevertebral soft tissues are normal. The intraspinal soft tissues are not fully imaged on this examination due to poor soft tissue contrast, but there is no gross soft tissue abnormality. There is 4 mm of anterolisthesis of C4  on C5 secondary to facet arthropathy. There is 3 mm of anterolisthesis of C5 on C6 secondary to bilateral facet arthropathy. There is degenerative disc disease at C6-C7. There is bilateral facet arthropathy throughout the cervical spine. Bilateral uncovertebral degenerative changes at C3-4 resulting in right foraminal stenosis. Bilateral uncovertebral degenerative changes at C4-5 with mild right foraminal stenosis. The visualized portions of the lung apices demonstrate no focal abnormality. IMPRESSION: 1. No acute intracranial pathology. 2. No acute osseous injury of the cervical spine. 3. Cervical spine spondylosis as described above. 4. Again noted is a subtle nondisplaced fracture involving the left lateral mass of C2 extending into the foramen transverse transversarium. Electronically Signed   By: Kathreen Devoid   On: 01/05/2015 01:48   Ct Cervical Spine Wo Contrast  01/05/2015  CLINICAL DATA:  Dementia, fell several days ago EXAM: CT HEAD WITHOUT CONTRAST CT  CERVICAL SPINE WITHOUT CONTRAST TECHNIQUE: Multidetector CT imaging of the head and cervical spine was performed following the standard protocol without intravenous contrast. Multiplanar CT image reconstructions of the cervical spine were also generated. COMPARISON:  07/30/2014, 08/31/2014, 09/03/2014, 09/28/2014, 11/07/2014, 12/17/2014, 12/27/2014 FINDINGS: CT HEAD FINDINGS There is no evidence of mass effect, midline shift, or extra-axial fluid collections. There is no evidence of a space-occupying lesion or intracranial hemorrhage. There is no evidence of a cortical-based area of acute infarction. There is generalized cerebral atrophy. There is periventricular white matter low attenuation likely secondary to microangiopathy. The ventricles and sulci are appropriate for the patient's age. The basal cisterns are patent. Visualized portions of the orbits are unremarkable. The visualized portions of the paranasal sinuses and mastoid air cells are unremarkable. Cerebrovascular atherosclerotic calcifications are noted. The osseous structures are unremarkable. There is a left frontal and left parietal scalp hematoma. CT CERVICAL SPINE FINDINGS The alignment is anatomic. The vertebral body heights are maintained. Again noted is a subtle nondisplaced fracture involving the left lateral mass of C2 extending into the foramen transverse transversarium. There is no acute fracture. There is no static listhesis. The prevertebral soft tissues are normal. The intraspinal soft tissues are not fully imaged on this examination due to poor soft tissue contrast, but there is no gross soft tissue abnormality. There is 4 mm of anterolisthesis of C4 on C5 secondary to facet arthropathy. There is 3 mm of anterolisthesis of C5 on C6 secondary to bilateral facet arthropathy. There is degenerative disc disease at C6-C7. There is bilateral facet arthropathy throughout the cervical spine. Bilateral uncovertebral degenerative changes at C3-4  resulting in right foraminal stenosis. Bilateral uncovertebral degenerative changes at C4-5 with mild right foraminal stenosis. The visualized portions of the lung apices demonstrate no focal abnormality. IMPRESSION: 1. No acute intracranial pathology. 2. No acute osseous injury of the cervical spine. 3. Cervical spine spondylosis as described above. 4. Again noted is a subtle nondisplaced fracture involving the left lateral mass of C2 extending into the foramen transverse transversarium. Electronically Signed   By: Kathreen Devoid   On: 01/05/2015 01:48      EKG Interpretation   Date/Time:  Saturday January 05 2015 00:42:38 EDT Ventricular Rate:  63 PR Interval:  73 QRS Duration: 102 QT Interval:  478 QTC Calculation: 489 R Axis:   -35 Text Interpretation:  Sinus rhythm Short PR interval Left axis deviation  Borderline prolonged QT interval No significant change since last tracing  Confirmed by Glynn Octave [phone]) on 01/05/2015 12:56:32 AM      MDM   Final diagnoses:  None  Patient presents emergency department after falling backwards and hitting her head. There is a small laceration on her posterior scalp. Tetanus shot is up-to-date from previous falls. Will obtain CT scan of the head and neck for evaluation. Patient has asymmetry of her pupils as well. EKG does not show cause for syncope. Laboratory studies unremarkable.  EKG does not show any cause for syncope. CT scan reveals no acute pathology. There is an old left lateral mass of C2 nondisplaced fracture that was seen. This was placed in the discharge paperwork.  She appears well in no acute distress, vital signs were within her normal limits and she is safe for discharge.   I, Ita Fritzsche, personally performed the services described in this documentation. All medical record entries made by the scribe were at my direction and in my presence.  I have reviewed the chart and discharge instructions and agree that the record  reflects my personal performance and is accurate and complete. Kingsten Enfield.  01/05/2015. 1:02 AM.      [name], MD 01/05/15 [phone]

## 2015-08-09 ENCOUNTER — Emergency Department (HOSPITAL_COMMUNITY)
Admission: EM | Admit: 2015-08-09 | Discharge: 2015-08-09 | Disposition: A | Discharge status: Home / Self Care | Payer: Medicare Other | Attending: Emergency Medicine | Admitting: Emergency Medicine

## 2015-08-09 ENCOUNTER — Encounter (HOSPITAL_COMMUNITY): Payer: Self-pay | Admitting: Emergency Medicine

## 2015-08-09 ENCOUNTER — Emergency Department (HOSPITAL_COMMUNITY): Payer: Medicare Other

## 2015-08-09 DIAGNOSIS — Y92129 Unspecified place in nursing home as the place of occurrence of the external cause: Secondary | ICD-10-CM | POA: Insufficient documentation

## 2015-08-09 DIAGNOSIS — F329 Major depressive disorder, single episode, unspecified: Secondary | ICD-10-CM | POA: Diagnosis not present

## 2015-08-09 DIAGNOSIS — Y999 Unspecified external cause status: Secondary | ICD-10-CM | POA: Diagnosis not present

## 2015-08-09 DIAGNOSIS — I10 Essential (primary) hypertension: Secondary | ICD-10-CM | POA: Insufficient documentation

## 2015-08-09 DIAGNOSIS — H1131 Conjunctival hemorrhage, right eye: Secondary | ICD-10-CM | POA: Diagnosis not present

## 2015-08-09 DIAGNOSIS — W19XXXA Unspecified fall, initial encounter: Secondary | ICD-10-CM | POA: Insufficient documentation

## 2015-08-09 DIAGNOSIS — Y939 Activity, unspecified: Secondary | ICD-10-CM | POA: Diagnosis not present

## 2015-08-09 DIAGNOSIS — Z79899 Other long term (current) drug therapy: Secondary | ICD-10-CM | POA: Diagnosis not present

## 2015-08-09 MED ORDER — LORAZEPAM 0.5 MG PO TABS
0.5000 mg | ORAL_TABLET | Freq: Once | ORAL | Status: AC
  Administered 2015-08-09: 0.5 mg via ORAL
  Filled 2015-08-09: qty 1

## 2015-08-09 NOTE — Discharge Instructions (Signed)

## 2015-08-09 NOTE — ED Notes (Signed)
Bed: [facility] Expected date:  Expected time:  Means of arrival:  Comments: EMS 90 Fall

## 2015-08-09 NOTE — ED Notes (Signed)
[facility] arrived for transport 

## 2015-08-09 NOTE — ED Notes (Signed)
Novella Olive, PA-C made aware of BP.

## 2015-08-09 NOTE — ED Notes (Signed)
Patient presents from [facility] via EMS for fall. Attempting to stand from w/c and fell, facility reports pt hit head, no LOC. Redness noted to right sclera.  Last VS: 64hr, 110cbg, 170/80.

## 2015-08-09 NOTE — ED Notes (Signed)
Bed: [facility]23 Expected date:  Expected time:  Means of arrival:  Comments: No bed  

## 2015-08-09 NOTE — ED Provider Notes (Signed)
CSN: [id number]     Arrival date & time 08/09/15  1651 History   First MD Initiated Contact with Patient 08/09/15 1741     No chief complaint on file.    (Consider location/radiation/quality/duration/timing/severity/associated sxs/prior Treatment) HPI  Tracy Reeves is a(n) 82 y.o. female who presents to the ED from her Nursing home with fall. The patient was transferring from her wheelchair and fell to the floor. She didn't hit her head but did not lose consciousness. Patient had a subconjunctival hemorrhage to the right eye prior to arrival. She has baseline dementia and there is a level V caveat.  Past Medical History  Diagnosis Date  . Hypertension   . Shingles   . Thyroid disease   . Dementia   . Thyroid activity decreased   . Fall at nursing home   . Syncope   . Humeral fracture 09/03/2014  . Renal insufficiency   . TBI (traumatic brain injury) [facility])    Past Surgical History  Procedure Laterality Date  . Abdominal hysterectomy    . Cataract extraction     No family history on file. Social History  Substance Use Topics  . Smoking status: Never Smoker   . Smokeless tobacco: Never Used  . Alcohol Use: No   OB History    No data available     Review of Systems  Ten systems reviewed and are negative for acute change, except as noted in the HPI.    Allergies  Eggs or egg-derived products  Home Medications   Prior to Admission medications   Medication Sig Start Date End Date Taking? Authorizing Provider  amiodarone (PACERONE) 200 MG tablet Take 100 mg by mouth daily.    Yes Historical Provider, MD  divalproex (DEPAKOTE SPRINKLE) 125 MG capsule Take 125 mg by mouth at bedtime.    Yes Historical Provider, MD  HYDROcodone-acetaminophen (NORCO/VICODIN) 5-325 MG tablet Take 0.5 tablets by mouth 2 (two) times daily as needed for moderate pain or severe pain.   Yes Historical Provider, MD  loratadine (CLARITIN) 10 MG tablet Take 10 mg by mouth daily.   Yes Historical  Provider, MD  LORazepam (ATIVAN) 0.5 MG tablet Take 0.25 mg by mouth daily as needed for anxiety (agitation).    Yes Historical Provider, MD  polyethylene glycol (MIRALAX / GLYCOLAX) packet Take 17 g by mouth daily.   Yes Historical Provider, MD  sertraline (ZOLOFT) 50 MG tablet Take 50 mg by mouth daily.   Yes Historical Provider, MD  amLODipine (NORVASC) 5 MG tablet Take 0.5 tablets (2.5 mg total) by mouth daily. Patient not taking: Reported on 12/27/2014 09/05/14   [name], MD   BP 131/80 mmHg  Pulse 73  Resp 18  SpO2 96% Physical Exam  Constitutional: She is oriented to person, place, and time. She appears well-developed and well-nourished. No distress.  HENT:  Head: Normocephalic and atraumatic.  Eyes: No scleral icterus.  R  Eye with Subconjunctival hemorrhage  Neck: Normal range of motion.  Cardiovascular: Normal rate, regular rhythm and normal heart sounds.  Exam reveals no gallop and no friction rub.   No murmur heard. Pulmonary/Chest: Effort normal and breath sounds normal. No respiratory distress.  Abdominal: Soft. Bowel sounds are normal. She exhibits no distension and no mass. There is no tenderness. There is no guarding.  Neurological: She is alert and oriented to person, place, and time.  Skin: Skin is warm and dry. She is not diaphoretic.  Nursing note and vitals reviewed.  ED Course  Procedures (including critical care time) Labs Review Labs Reviewed - No data to display  Imaging Review Ct Head Wo Contrast  08/09/2015  CLINICAL DATA:  Fall. EXAM: CT HEAD WITHOUT CONTRAST CT CERVICAL SPINE WITHOUT CONTRAST TECHNIQUE: Multidetector CT imaging of the head and cervical spine was performed following the standard protocol without intravenous contrast. Multiplanar CT image reconstructions of the cervical spine were also generated. COMPARISON:  01/05/2015 head and cervical spine CT. FINDINGS: CT HEAD FINDINGS No evidence of parenchymal hemorrhage or extra-axial fluid  collection. No mass lesion, mass effect, or midline shift. No CT evidence of acute infarction. Intracranial atherosclerosis. Nonspecific stable subcortical and periventricular white matter hypodensity, most in keeping with chronic small vessel ischemic change. Generalized cerebral volume loss. Cerebral ventricle sizes are stable and concordant with the degree of cerebral volume loss. The visualized paranasal sinuses are essentially clear. The mastoid air cells are unopacified. No evidence of calvarial fracture. CT CERVICAL SPINE FINDINGS Motion degraded study. No fracture is detected in the cervical spine. The previously described nondisplaced left lateral C2 mass fracture appears healed. No prevertebral soft tissue swelling. There is mild straightening of the cervical spine, usually due to positioning and/or muscle spasm. Dens is well positioned between the lateral masses of C1. No subluxation is seen at the facets. Moderate degenerative disc disease throughout the cervical spine. Severe bilateral facet arthropathy. Severe degenerative foraminal stenosis on the right at C3-4. Mild degenerative foraminal stenosis on the right at C4-5. There is stable 2 mm anterolisthesis at C3-4 and 4 mm anterolisthesis at C4-5. There is 4 mm anterolisthesis at C5-6, increased from 2 mm on 01/05/2015. Visualized mastoid air cells appear clear. No evidence of intra-axial hemorrhage in the visualized brain. No gross cervical canal hematoma. No significant pulmonary nodules at the visualized lung apices. No cervical adenopathy or other significant neck soft tissue abnormality. IMPRESSION: 1. No evidence of acute intracranial abnormality. No evidence of calvarial fracture. 2. Generalized cerebral volume loss and chronic small vessel ischemia. 3. Motion degraded cervical spine CT. No cervical spine fracture detected. Previously described nondisplaced left lateral C2 mass fracture appears healed. 4. Anterolisthesis at C5-6 measures 4 mm,  previously 2 mm on 01/05/2015, which could indicate chronic ligamentous laxity or acute ligamentous injury. If there is clinical concern for acute ligamentous injury, a neck MRI could be obtained. 5. Degenerative changes in the cervical spine as described. Electronically Signed   By: Ilona Sorrel M.D.   On: 08/09/2015 21:03   Ct Cervical Spine Wo Contrast  08/09/2015  CLINICAL DATA:  Fall. EXAM: CT HEAD WITHOUT CONTRAST CT CERVICAL SPINE WITHOUT CONTRAST TECHNIQUE: Multidetector CT imaging of the head and cervical spine was performed following the standard protocol without intravenous contrast. Multiplanar CT image reconstructions of the cervical spine were also generated. COMPARISON:  01/05/2015 head and cervical spine CT. FINDINGS: CT HEAD FINDINGS No evidence of parenchymal hemorrhage or extra-axial fluid collection. No mass lesion, mass effect, or midline shift. No CT evidence of acute infarction. Intracranial atherosclerosis. Nonspecific stable subcortical and periventricular white matter hypodensity, most in keeping with chronic small vessel ischemic change. Generalized cerebral volume loss. Cerebral ventricle sizes are stable and concordant with the degree of cerebral volume loss. The visualized paranasal sinuses are essentially clear. The mastoid air cells are unopacified. No evidence of calvarial fracture. CT CERVICAL SPINE FINDINGS Motion degraded study. No fracture is detected in the cervical spine. The previously described nondisplaced left lateral C2 mass fracture appears healed. No prevertebral soft tissue  swelling. There is mild straightening of the cervical spine, usually due to positioning and/or muscle spasm. Dens is well positioned between the lateral masses of C1. No subluxation is seen at the facets. Moderate degenerative disc disease throughout the cervical spine. Severe bilateral facet arthropathy. Severe degenerative foraminal stenosis on the right at C3-4. Mild degenerative foraminal  stenosis on the right at C4-5. There is stable 2 mm anterolisthesis at C3-4 and 4 mm anterolisthesis at C4-5. There is 4 mm anterolisthesis at C5-6, increased from 2 mm on 01/05/2015. Visualized mastoid air cells appear clear. No evidence of intra-axial hemorrhage in the visualized brain. No gross cervical canal hematoma. No significant pulmonary nodules at the visualized lung apices. No cervical adenopathy or other significant neck soft tissue abnormality. IMPRESSION: 1. No evidence of acute intracranial abnormality. No evidence of calvarial fracture. 2. Generalized cerebral volume loss and chronic small vessel ischemia. 3. Motion degraded cervical spine CT. No cervical spine fracture detected. Previously described nondisplaced left lateral C2 mass fracture appears healed. 4. Anterolisthesis at C5-6 measures 4 mm, previously 2 mm on 01/05/2015, which could indicate chronic ligamentous laxity or acute ligamentous injury. If there is clinical concern for acute ligamentous injury, a neck MRI could be obtained. 5. Degenerative changes in the cervical spine as described. Electronically Signed   By: Ilona Sorrel M.D.   On: 08/09/2015 21:03   I have personally reviewed and evaluated these images and lab results as part of my medical decision-making.   EKG Interpretation None      MDM   Final diagnoses:  Fall, initial encounter   Filed Vitals:   08/09/15 1839 08/09/15 2125  BP: 206/86 131/80  Pulse: 67 73  Resp: 18 18  SpO2: 95% 96%   Patient appears to have been in ativan withdraw. Given her normal dose and her vitals have normalized. Elderly patient with dementia. Fall at nursing home. At baseline mental status. No signs of injury on PE. No blood thinners. Negative labs and imaging. Appears safe to return to SNF     Margarita Mail, PA-C 08/09/15 2134

## 2015-08-09 NOTE — ED Notes (Signed)
Pt undressed, saturated brief removed. Siderails padded for safety, pt continually attempting to slide to end of the bed. Pt talking gibberish, unable to send patient to CT at this time, order obtained for Ativan, pt does take Ativan PRN at [facility].

## 2015-08-09 NOTE — ED Notes (Signed)
Report called to [facility] of [place], [place] called for transport

## 2015-10-03 NOTE — ED Provider Notes (Signed)
Medical screening examination/treatment/procedure(s) were conducted as a shared visit with non-physician practitioner(s) and myself.  I personally evaluated the patient during the encounter.  Agree with plan.  Pt improved. [place] for d/c.   EKG Interpretation None       Isla Pence, MD 10/03/15 1350

## 2016-01-23 ENCOUNTER — Emergency Department (HOSPITAL_COMMUNITY): Payer: Medicare Other

## 2016-01-23 ENCOUNTER — Emergency Department (HOSPITAL_COMMUNITY)
Admission: EM | Admit: 2016-01-23 | Discharge: 2016-01-23 | Disposition: A | Discharge status: Home / Self Care | Payer: Medicare Other | Attending: Emergency Medicine | Admitting: Emergency Medicine

## 2016-01-23 ENCOUNTER — Encounter (HOSPITAL_COMMUNITY): Payer: Self-pay | Admitting: Emergency Medicine

## 2016-01-23 DIAGNOSIS — Z79899 Other long term (current) drug therapy: Secondary | ICD-10-CM | POA: Insufficient documentation

## 2016-01-23 DIAGNOSIS — E039 Hypothyroidism, unspecified: Secondary | ICD-10-CM | POA: Insufficient documentation

## 2016-01-23 DIAGNOSIS — R1110 Vomiting, unspecified: Secondary | ICD-10-CM | POA: Diagnosis present

## 2016-01-23 DIAGNOSIS — R791 Abnormal coagulation profile: Secondary | ICD-10-CM | POA: Insufficient documentation

## 2016-01-23 DIAGNOSIS — R410 Disorientation, unspecified: Secondary | ICD-10-CM

## 2016-01-23 DIAGNOSIS — I10 Essential (primary) hypertension: Secondary | ICD-10-CM | POA: Diagnosis not present

## 2016-01-23 DIAGNOSIS — N390 Urinary tract infection, site not specified: Secondary | ICD-10-CM

## 2016-01-23 LAB — URINE MICROSCOPIC-ADD ON

## 2016-01-23 LAB — URINALYSIS, ROUTINE W REFLEX MICROSCOPIC
BILIRUBIN URINE: NEGATIVE
GLUCOSE, UA: NEGATIVE mg/dL
HGB URINE DIPSTICK: NEGATIVE
Ketones, ur: NEGATIVE mg/dL
Nitrite: POSITIVE — AB
PH: 6 (ref 5.0–8.0)
Protein, ur: NEGATIVE mg/dL
SPECIFIC GRAVITY, URINE: 1.019 (ref 1.005–1.030)

## 2016-01-23 LAB — PROTIME-INR
INR: 1.09
Prothrombin Time: 14.1 seconds (ref 11.4–15.2)

## 2016-01-23 LAB — COMPREHENSIVE METABOLIC PANEL
ALBUMIN: 3.9 g/dL (ref 3.5–5.0)
ALK PHOS: 52 U/L (ref 38–126)
ALT: 13 U/L — AB (ref 14–54)
AST: 15 U/L (ref 15–41)
Anion gap: 7 (ref 5–15)
BILIRUBIN TOTAL: 0.6 mg/dL (ref 0.3–1.2)
BUN: 33 mg/dL — ABNORMAL HIGH (ref 6–20)
CALCIUM: 8.6 mg/dL — AB (ref 8.9–10.3)
CO2: 25 mmol/L (ref 22–32)
CREATININE: 1.46 mg/dL — AB (ref 0.44–1.00)
Chloride: 105 mmol/L (ref 101–111)
GFR calc Af Amer: 35 mL/min — ABNORMAL LOW (ref >60)
GFR calc non Af Amer: 30 mL/min — ABNORMAL LOW (ref >60)
GLUCOSE: 106 mg/dL — AB (ref 65–99)
Potassium: 4.5 mmol/L (ref 3.5–5.1)
SODIUM: 137 mmol/L (ref 135–145)
TOTAL PROTEIN: 6.8 g/dL (ref 6.5–8.1)

## 2016-01-23 LAB — CBC
HCT: 41.5 % (ref 36.0–46.0)
HEMOGLOBIN: 13.5 g/dL (ref 12.0–15.0)
MCH: 29.5 pg (ref 26.0–34.0)
MCHC: 32.5 g/dL (ref 30.0–36.0)
MCV: 90.8 fL (ref 78.0–100.0)
PLATELETS: 224 10*3/uL (ref 150–400)
RBC: 4.57 MIL/uL (ref 3.87–5.11)
RDW: 15.8 % — ABNORMAL HIGH (ref 11.5–15.5)
WBC: 8.9 10*3/uL (ref 4.0–10.5)

## 2016-01-23 LAB — DIFFERENTIAL
Basophils Absolute: 0 10*3/uL (ref 0.0–0.1)
Basophils Relative: 0 %
EOS PCT: 1 %
Eosinophils Absolute: 0.1 10*3/uL (ref 0.0–0.7)
LYMPHS ABS: 1.5 10*3/uL (ref 0.7–4.0)
LYMPHS PCT: 16 %
MONO ABS: 0.7 10*3/uL (ref 0.1–1.0)
Monocytes Relative: 7 %
NEUTROS ABS: 6.7 10*3/uL (ref 1.7–7.7)
NEUTROS PCT: 76 %

## 2016-01-23 LAB — I-STAT TROPONIN, ED: Troponin i, poc: 0.01 ng/mL (ref 0.00–0.08)

## 2016-01-23 LAB — VALPROIC ACID LEVEL: Valproic Acid Lvl: 17 ug/mL — ABNORMAL LOW (ref 50.0–100.0)

## 2016-01-23 MED ORDER — CEPHALEXIN 500 MG PO CAPS: 500.0000 mg | ORAL_CAPSULE | Freq: Four times a day (QID) | ORAL | 0 refills | Status: DC

## 2016-01-23 MED ORDER — DEXTROSE 5 % IV SOLN
1.0000 g | Freq: Once | INTRAVENOUS | Status: AC
  Administered 2016-01-23: 1 g via INTRAVENOUS
  Filled 2016-01-23: qty 10

## 2016-01-23 MED ORDER — SODIUM CHLORIDE 0.9 % IV BOLUS (SEPSIS)
500.0000 mL | Freq: Once | INTRAVENOUS | Status: AC
  Administered 2016-01-23: 500 mL via INTRAVENOUS

## 2016-01-23 NOTE — ED Notes (Signed)
Patient continued to try to slide out of bed.  MD made aware.  MD to put in orders for soft safety restraints

## 2016-01-23 NOTE — ED Notes (Signed)
Bed: [id number] Expected date:  Expected time:  Means of arrival:  Comments: [facility]

## 2016-01-23 NOTE — ED Provider Notes (Signed)
[facility] DEPT Provider Note   CSN: [id number] Arrival date & time: 01/23/16  1123     History   Chief Complaint Chief Complaint  Patient presents with  . Emesis  . Altered Mental Status    HPI Tracy Reeves is a 82 y.o. female.  HPI Pt denies any complaints.  She states she seems to be doing OK.  She denies headache, chest pain, trouble with her breathing, vomiting or diarrhea.  Denies any abdominal pain.  Admits she has not had much of an appetite.  According to the EMS report the patient was on the commode. She had an episode of vomiting. Patient was assessed by staff and she appeared less responsive and pale. When EMS arrived the patient was alert and awake. EMS did give her 300 mL of IV fluid. She was given an additional 200 mL in the emergency room for my evaluation. According to the report the patient's skin color and appearance is improved with the IV fluid bolus.   Past Medical History:  Diagnosis Date  . Dementia   . Fall at nursing home   . Humeral fracture 09/03/2014  . Hypertension   . Renal insufficiency   . Shingles   . Syncope   . TBI (traumatic brain injury) ([place])   . Thyroid activity decreased   . Thyroid disease     Patient Active Problem List   Diagnosis Date Noted  . Fracture closed, humerus 09/04/2014  . Proximal humeral fracture   . Hygroma   . Humeral surgical neck fracture 09/03/2014  . V tach ([place]) 09/03/2014  . Subdural hygroma 09/03/2014  . Laceration of scalp without complication   . Syncope and collapse 08/31/2014  . Depression 07/01/2014  . Hypothyroidism due to acquired atrophy of thyroid 07/01/2014  . Essential hypertension 07/01/2014  . Vascular dementia without behavioral disturbance 06/19/2014  . Hypertension   . Hypothyroid   . Fall 05/23/2014  . TBI (traumatic brain injury) ([place]) 05/20/2014    Past Surgical History:  Procedure Laterality Date  . ABDOMINAL HYSTERECTOMY    . CATARACT EXTRACTION      OB History      No data available       Home Medications    Prior to Admission medications   Medication Sig Start Date End Date Taking? Authorizing Provider  acetaminophen (TYLENOL) 500 MG tablet Take 500 mg by mouth 2 (two) times daily as needed for mild pain.   Yes Historical Provider, MD  amiodarone (PACERONE) 200 MG tablet Take 100 mg by mouth daily.    Yes Historical Provider, MD  dextromethorphan-guaiFENesin (ROBITUSSIN-DM) 10-100 MG/5ML liquid Take 15 mLs by mouth 2 (two) times daily as needed for cough.   Yes Historical Provider, MD  divalproex (DEPAKOTE SPRINKLE) 125 MG capsule Take 125 mg by mouth at bedtime.    Yes Historical Provider, MD  HYDROcodone-acetaminophen (NORCO/VICODIN) 5-325 MG tablet Take 0.5 tablets by mouth 2 (two) times daily as needed for moderate pain or severe pain.   Yes Historical Provider, MD  LORazepam (ATIVAN) 0.5 MG tablet Take 0.25 mg by mouth daily as needed for anxiety (agitation).    Yes Historical Provider, MD  Polyethyl Glycol-Propyl Glycol (SYSTANE) 0.4-0.3 % SOLN Apply 1 drop to eye daily.   Yes Historical Provider, MD  polyethylene glycol (MIRALAX / GLYCOLAX) packet Take 17 g by mouth daily.   Yes Historical Provider, MD  sertraline (ZOLOFT) 25 MG tablet Take 25 mg by mouth daily.   Yes Historical Provider, MD  sertraline (ZOLOFT) 50 MG tablet Take 50 mg by mouth daily.   Yes Historical Provider, MD  amLODipine (NORVASC) 5 MG tablet Take 0.5 tablets (2.5 mg total) by mouth daily. Patient not taking: Reported on 12/27/2014 09/05/14   Tracy Aline, MD  cephALEXin (KEFLEX) 500 MG capsule Take 1 capsule (500 mg total) by mouth 4 (four) times daily. 01/23/16   Tracy Rank, MD  sulfamethoxazole-trimethoprim (BACTRIM DS,SEPTRA DS) 800-160 MG tablet Take 1 tablet by mouth 2 (two) times daily. 01/15/16   Historical Provider, MD    Family History No family history on file.  Social History Social History  Substance Use Topics  . Smoking status: Never Smoker  .  Smokeless tobacco: Never Used  . Alcohol use No     Allergies   Other; Ace inhibitors; and Eggs or egg-derived products   Review of Systems Review of Systems  All other systems reviewed and are negative.    Physical Exam Updated Vital Signs BP 174/72 (BP Location: Right Arm)   Pulse 70   Temp 98.2 F (36.8 C)   Resp 15   SpO2 96%   Physical Exam  Constitutional: No distress.  Elderly, frail  HENT:  Head: Normocephalic.  Right Ear: External ear normal.  Left Ear: External ear normal.  Old bruising left forehead  Eyes: Conjunctivae are normal. Right eye exhibits no discharge. Left eye exhibits no discharge. No scleral icterus.  Neck: Neck supple. No tracheal deviation present.  Cardiovascular: Normal rate, regular rhythm and intact distal pulses.   Pulmonary/Chest: Effort normal and breath sounds normal. No stridor. No respiratory distress. She has no wheezes. She has no rales.  Abdominal: Soft. Bowel sounds are normal. She exhibits no distension. There is no tenderness. There is no rebound and no guarding.  Musculoskeletal: She exhibits no edema or tenderness.  Neurological: She is alert. No cranial nerve deficit (no facial droop, extraocular movements intact, no slurred speech) or sensory deficit. She exhibits normal muscle tone. She displays no seizure activity. Coordination normal. GCS eye subscore is 4. GCS verbal subscore is 4. GCS motor subscore is 6.  Patient follows commands, equal grip strength bilateral upper extremities, able to lift both legs off the bed  Skin: Skin is warm and dry. No rash noted.  Psychiatric: She has a normal mood and affect.  Nursing note and vitals reviewed.    ED Treatments / Results  Labs (all labs ordered are listed, but only abnormal results are displayed) Labs Reviewed  CBC - Abnormal; Notable for the following:       Result Value   RDW 15.8 (*)    All other components within normal limits  COMPREHENSIVE METABOLIC PANEL -  Abnormal; Notable for the following:    Glucose, Bld 106 (*)    BUN 33 (*)    Creatinine, Ser 1.46 (*)    Calcium 8.6 (*)    ALT 13 (*)    GFR calc non Af Amer 30 (*)    GFR calc Af Amer 35 (*)    All other components within normal limits  URINALYSIS, ROUTINE W REFLEX MICROSCOPIC (NOT AT [facility]) - Abnormal; Notable for the following:    APPearance CLOUDY (*)    Nitrite POSITIVE (*)    Leukocytes, UA MODERATE (*)    All other components within normal limits  VALPROIC ACID LEVEL - Abnormal; Notable for the following:    Valproic Acid Lvl 17 (*)    All other components within normal limits  URINE  MICROSCOPIC-ADD ON - Abnormal; Notable for the following:    Squamous Epithelial / LPF 0-5 (*)    Bacteria, UA MANY (*)    Casts HYALINE CASTS (*)    All other components within normal limits  PROTIME-INR  DIFFERENTIAL  I-STAT TROPOININ, ED    EKG  EKG Interpretation  Date/Time:  Thursday January 23 2016 12:09:23 EDT Ventricular Rate:  64 PR Interval:    QRS Duration: 126 QT Interval:  461 QTC Calculation: 476 R Axis:   -35 Text Interpretation:  Sinus rhythm Short PR interval Left ventricular hypertrophy Nonspecific T abnormalities, lateral leads Artifact in lead(s) I II aVR aVL V1 V2 Poor data quality in current ECG precludes serial comparison Confirmed by Lesieli Bresee  MD-J, Maira Christon [id number]) on 01/23/2016 12:47:01 PM       Radiology Ct Head Wo Contrast  Result Date: 01/23/2016 CLINICAL DATA:  Nausea and vomiting.  Lethargy.  Recent fall. EXAM: CT HEAD WITHOUT CONTRAST TECHNIQUE: Contiguous axial images were obtained from the base of the skull through the vertex without intravenous contrast. COMPARISON:  08/09/2015 FINDINGS: Brain: No evidence of acute infarction, hemorrhage, hydrocephalus, extra-axial collection or mass lesion/mass effect. There is prominence of the sulci and ventricles compatible with brain atrophy. Diffuse low attenuation throughout the subcortical and periventricular white  matter is identified compatible with chronic microvascular disease. Vascular: No hyperdense vessel or unexpected calcification. Skull: Moderate mucosal thickening involves the left maxillary sinus. The remaining paranasal sinuses are clear. The mastoid air cells are clear. Sinuses/Orbits: No acute finding. Other: None. IMPRESSION: 1. Small vessel ischemic disease and brain atrophy. 2. No acute intracranial abnormalities noted. Electronically Signed   By: Kerby Moors M.D.   On: 01/23/2016 12:34    Procedures Procedures (including critical care time)  Medications Ordered in ED Medications  sodium chloride 0.9 % bolus 500 mL (500 mLs Intravenous New Bag/Given 01/23/16 1543)  cefTRIAXone (ROCEPHIN) 1 g in dextrose 5 % 50 mL IVPB (0 g Intravenous Stopped 01/23/16 1625)     Initial Impression / Assessment and Plan / ED Course  I have reviewed the triage vital signs and the nursing notes.  Pertinent labs & imaging results that were available during my care of the patient were reviewed by me and considered in my medical decision making (see chart for details).  Clinical Course  Comment By Time  Pt is getting a little more agitated.  She is trying to get out of bed.  UA does suggest a uti.  Will give dose of rocephin Tracy Rank, MD 11/02 1525   Discussed findings with the patient and her daughter.  Soft restraints applied temporarily for safety.  Pt was given something to eat and drink. DIscussed inpatient versus outpatient. Pt is in a memory unit at a nursing facility.  Pt is wheelchair bound.  They should be able to watch her carefully.  Final Clinical Impressions(s) / ED Diagnoses   Final diagnoses:  Lower urinary tract infectious disease  Confusion    New Prescriptions New Prescriptions   CEPHALEXIN (KEFLEX) 500 MG CAPSULE    Take 1 capsule (500 mg total) by mouth 4 (four) times daily.     Tracy Rank, MD 01/23/16 [phone]

## 2016-01-23 NOTE — ED Notes (Signed)
Pt wheeled to daughter's car.  Daughter verbalized understanding of discharge instructions

## 2016-01-23 NOTE — Discharge Instructions (Signed)
Take the antibiotics as prescribed, return as needed for fever, worsening symptoms

## 2016-01-23 NOTE — ED Triage Notes (Signed)
Pt from [facility] with nausea and vomiting. Facility reports pt was sitting on the toilet when the episode of vomiting occurred and didn't appear to be at her baseline. Per [facility] color has improved post 300 Fluid bolus and more alert. Denies Pain.   100/38 BP post fluid 127/56 76 HR now 56  193 CBG 97.7 tympanic temp

## 2016-01-23 NOTE — ED Notes (Signed)
ED Provider at bedside. 

## 2016-02-03 IMAGING — CT CT HEAD W/O CM
3 series · 17 of 30 positions shown, 19 images · non-contrast
Comparison: September 03, 2014 and August 31, 2014

CLINICAL DATA: Pain following fall.  Confusion.

EXAM:
CT HEAD WITHOUT CONTRAST
TECHNIQUE: Contiguous axial images were obtained from the base of the skull
through the vertex without intravenous contrast.

[Series 2: head 5.0 h30s · axial · 0.41mm/px · z∈[+1254,+1354]mm · 5 of 30 slices shown, 7 images]
[im 5/30  brain]
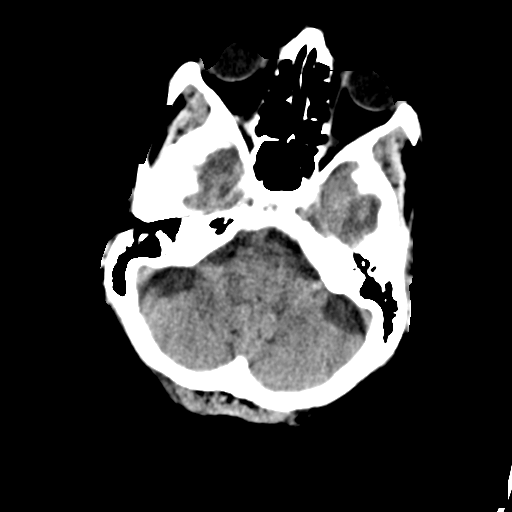
[im 5/30  bone]
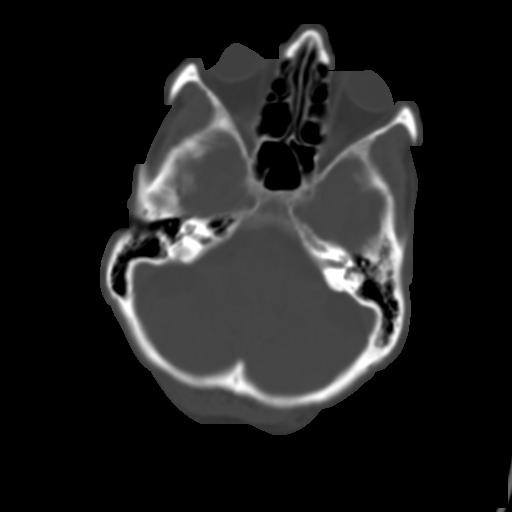
[im 10/30  brain]
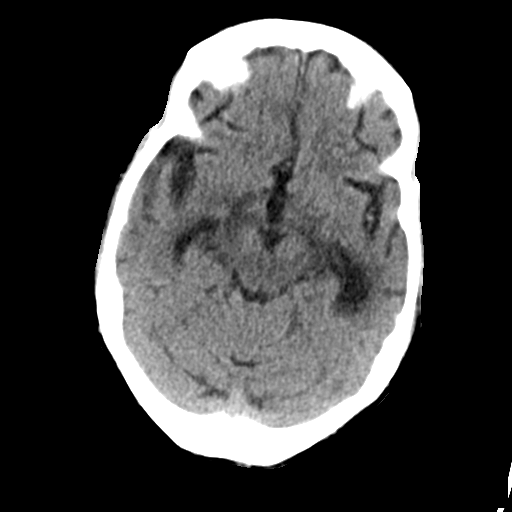
[im 15/30  brain]
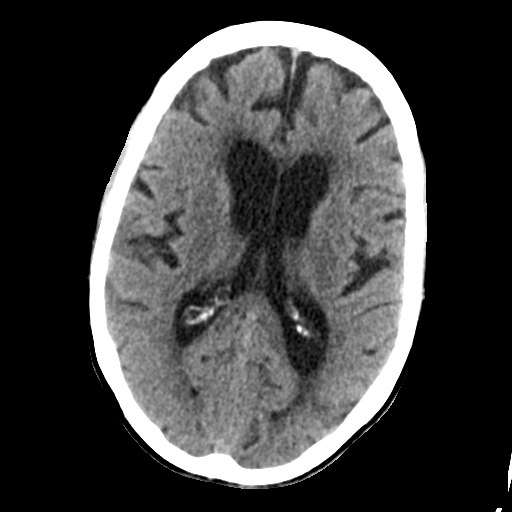
[im 20/30  brain]
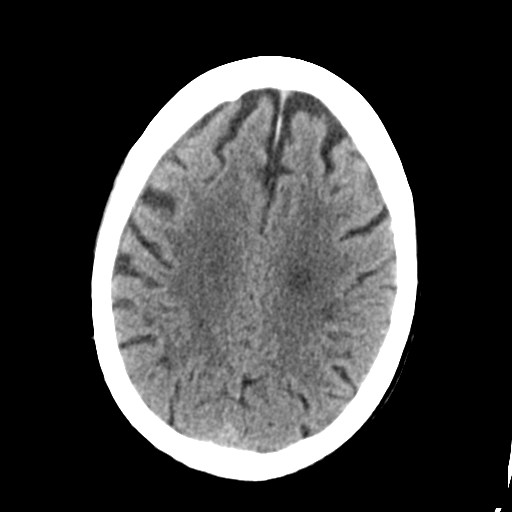
[im 25/30  brain]
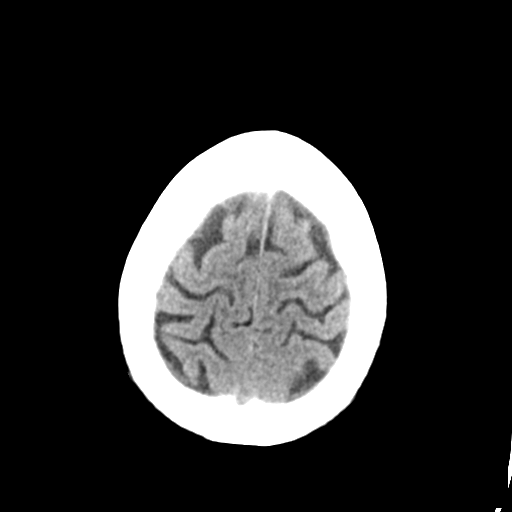
[im 25/30  bone]
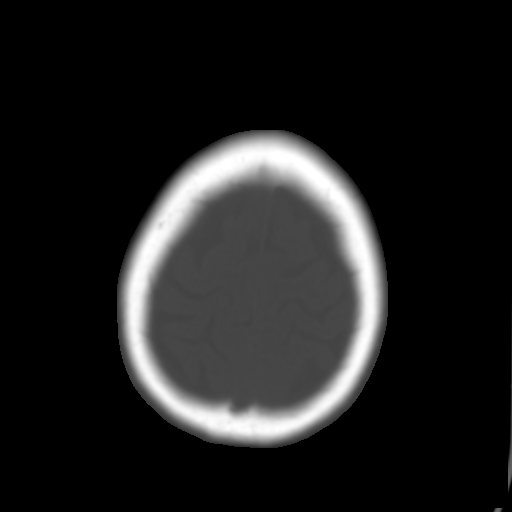

[Series 3: head 2.0 mpr · axial · 0.41mm/px · z∈[+1263,+1384]mm · 8 of 72 slices shown]
[im 5/72  brain]
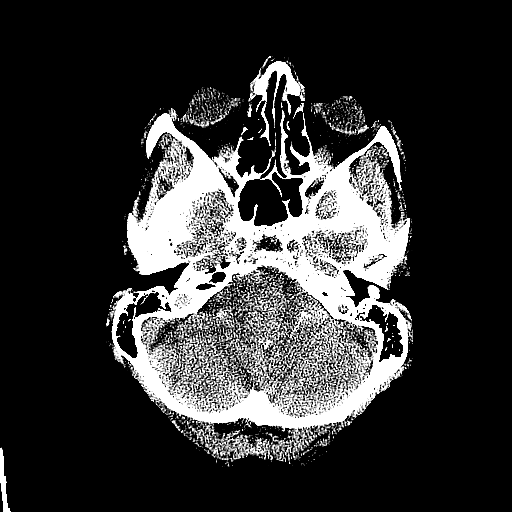
[im 14/72  brain]
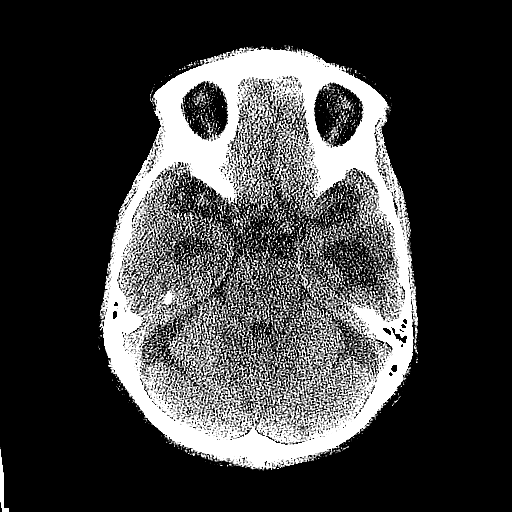
[im 23/72  brain]
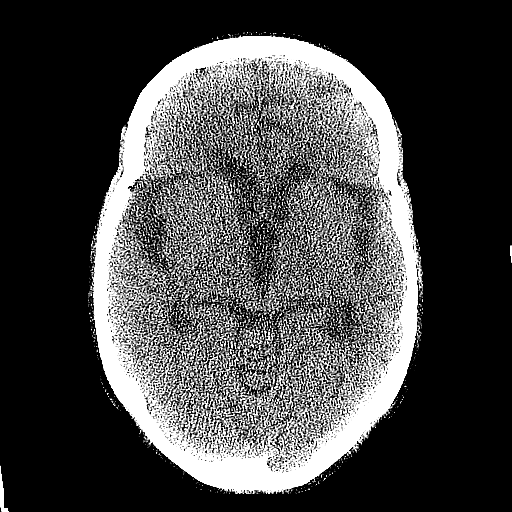
[im 32/72  brain]
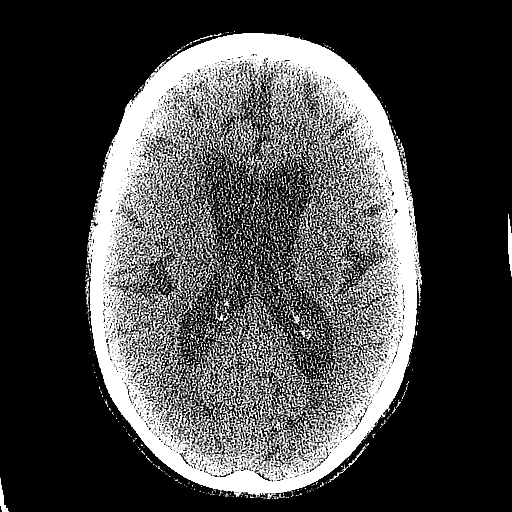
[im 40/72  brain]
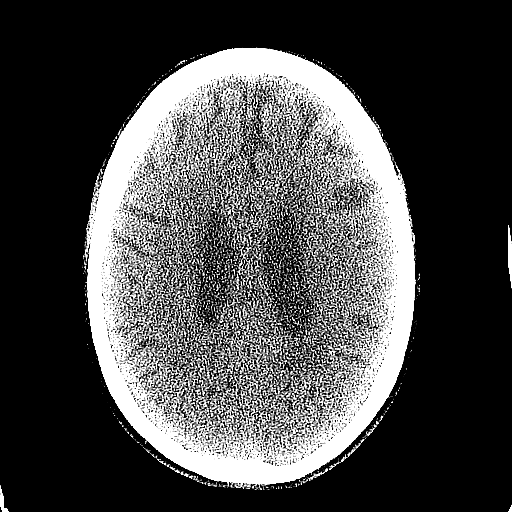
[im 49/72  brain]
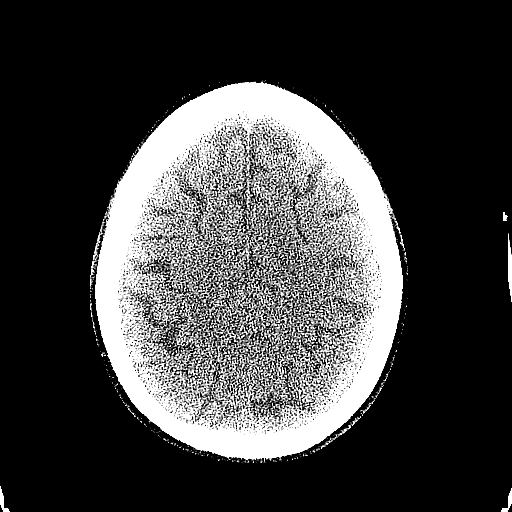
[im 58/72  brain]
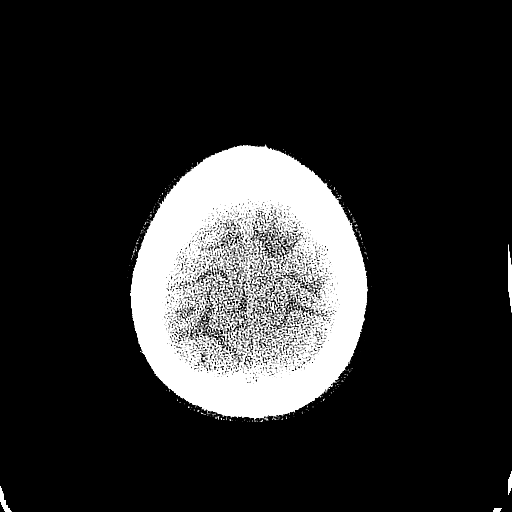
[im 67/72  brain]
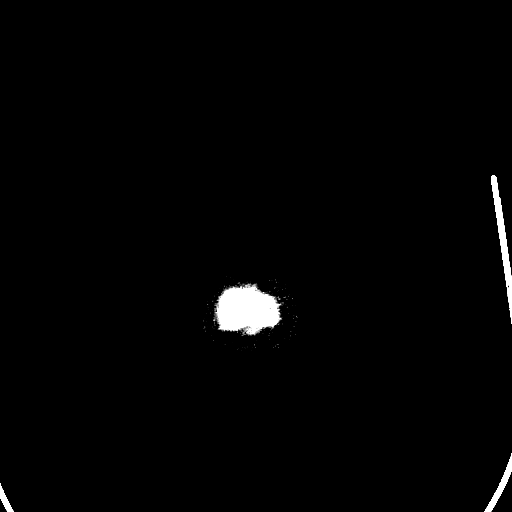

[Series 4: head 5.0 mpr · axial · 0.41mm/px · z∈[+1285,+1359]mm · 4 of 27 slices shown]
[im 6/27  brain]
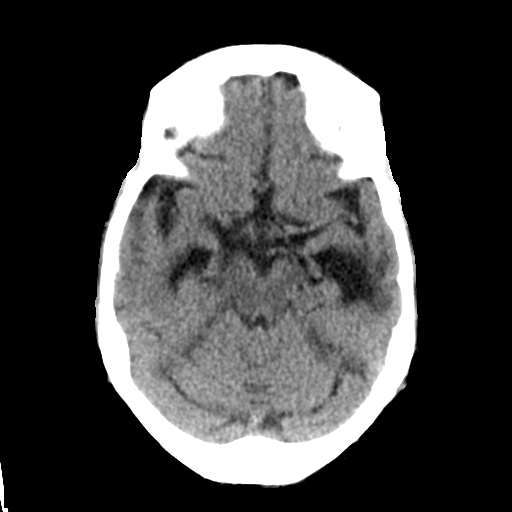
[im 11/27  brain]
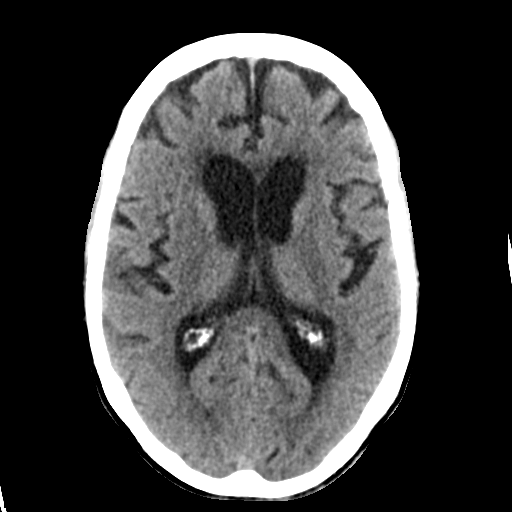
[im 16/27  brain]
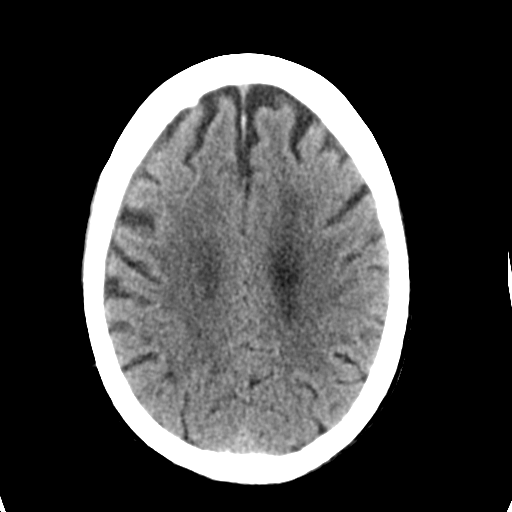
[im 21/27  brain]
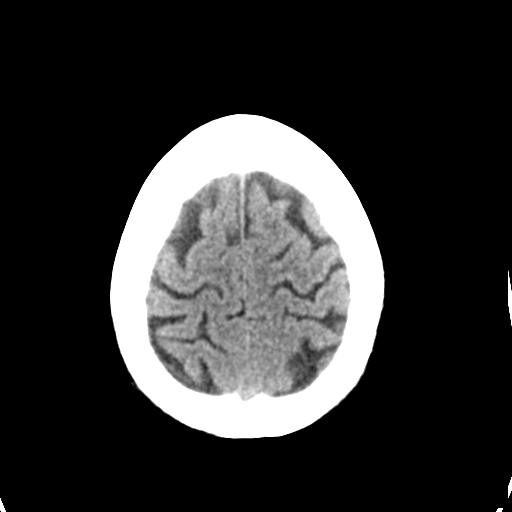

[17 of 30 positions shown; findings below may reference images not displayed]

FINDINGS: Moderate diffuse atrophy is stable. There has been interval
resolution of supratentorial subdural hygromas since prior study. On
the current examination, there is no appreciable intracranial mass,
hemorrhage, extra-axial fluid collection, or midline shift. Patchy
small vessel disease throughout the centra semiovale bilaterally is
present. There is evidence of a prior small infarct in the left
occipital lobe, stable. There is no new gray-white compartment
lesion. There is no demonstrable acute infarct. The bony calvarium
appears intact. The mastoid air cells are clear.
IMPRESSION: Atrophy with supratentorial small vessel disease. Prior small left
occipital lobe infarct, stable. No acute infarct apparent. No
hemorrhage or mass effect. No extra-axial fluid collections are
currently appreciable. There is been interval resort shin of
subdural hygromas bilaterally compared to prior study.

## 2016-02-26 ENCOUNTER — Emergency Department (HOSPITAL_COMMUNITY): Payer: Medicare Other

## 2016-02-26 ENCOUNTER — Inpatient Hospital Stay (HOSPITAL_COMMUNITY)
Admission: EM | Admit: 2016-02-26 | Discharge: 2016-02-29 | DRG: 069 | Disposition: A | Discharge status: Skilled Nursing Facility | Payer: Medicare Other | Attending: Internal Medicine | Admitting: Internal Medicine

## 2016-02-26 ENCOUNTER — Encounter (HOSPITAL_COMMUNITY): Payer: Self-pay

## 2016-02-26 DIAGNOSIS — Z87440 Personal history of urinary (tract) infections: Secondary | ICD-10-CM

## 2016-02-26 DIAGNOSIS — R509 Fever, unspecified: Secondary | ICD-10-CM

## 2016-02-26 DIAGNOSIS — Z91012 Allergy to eggs: Secondary | ICD-10-CM

## 2016-02-26 DIAGNOSIS — J209 Acute bronchitis, unspecified: Secondary | ICD-10-CM | POA: Diagnosis present

## 2016-02-26 DIAGNOSIS — G459 Transient cerebral ischemic attack, unspecified: Secondary | ICD-10-CM | POA: Diagnosis not present

## 2016-02-26 DIAGNOSIS — E034 Atrophy of thyroid (acquired): Secondary | ICD-10-CM | POA: Diagnosis present

## 2016-02-26 DIAGNOSIS — K7290 Hepatic failure, unspecified without coma: Secondary | ICD-10-CM | POA: Diagnosis present

## 2016-02-26 DIAGNOSIS — F329 Major depressive disorder, single episode, unspecified: Secondary | ICD-10-CM | POA: Diagnosis present

## 2016-02-26 DIAGNOSIS — I10 Essential (primary) hypertension: Secondary | ICD-10-CM | POA: Diagnosis not present

## 2016-02-26 DIAGNOSIS — R4182 Altered mental status, unspecified: Secondary | ICD-10-CM | POA: Diagnosis not present

## 2016-02-26 DIAGNOSIS — R1310 Dysphagia, unspecified: Secondary | ICD-10-CM | POA: Diagnosis present

## 2016-02-26 DIAGNOSIS — Z87820 Personal history of traumatic brain injury: Secondary | ICD-10-CM

## 2016-02-26 DIAGNOSIS — G40909 Epilepsy, unspecified, not intractable, without status epilepticus: Secondary | ICD-10-CM | POA: Diagnosis present

## 2016-02-26 DIAGNOSIS — R569 Unspecified convulsions: Secondary | ICD-10-CM

## 2016-02-26 DIAGNOSIS — R0902 Hypoxemia: Secondary | ICD-10-CM | POA: Diagnosis present

## 2016-02-26 DIAGNOSIS — Z888 Allergy status to other drugs, medicaments and biological substances status: Secondary | ICD-10-CM

## 2016-02-26 DIAGNOSIS — R55 Syncope and collapse: Secondary | ICD-10-CM | POA: Insufficient documentation

## 2016-02-26 DIAGNOSIS — F32A Depression, unspecified: Secondary | ICD-10-CM | POA: Diagnosis present

## 2016-02-26 DIAGNOSIS — R4701 Aphasia: Secondary | ICD-10-CM | POA: Diagnosis present

## 2016-02-26 DIAGNOSIS — E039 Hypothyroidism, unspecified: Secondary | ICD-10-CM | POA: Diagnosis present

## 2016-02-26 DIAGNOSIS — Z79899 Other long term (current) drug therapy: Secondary | ICD-10-CM

## 2016-02-26 DIAGNOSIS — Z66 Do not resuscitate: Secondary | ICD-10-CM | POA: Diagnosis present

## 2016-02-26 DIAGNOSIS — Z90710 Acquired absence of both cervix and uterus: Secondary | ICD-10-CM

## 2016-02-26 DIAGNOSIS — N289 Disorder of kidney and ureter, unspecified: Secondary | ICD-10-CM | POA: Diagnosis present

## 2016-02-26 DIAGNOSIS — E876 Hypokalemia: Secondary | ICD-10-CM | POA: Diagnosis present

## 2016-02-26 DIAGNOSIS — I4891 Unspecified atrial fibrillation: Secondary | ICD-10-CM | POA: Diagnosis present

## 2016-02-26 DIAGNOSIS — R9401 Abnormal electroencephalogram [EEG]: Secondary | ICD-10-CM | POA: Diagnosis present

## 2016-02-26 DIAGNOSIS — F0390 Unspecified dementia without behavioral disturbance: Secondary | ICD-10-CM | POA: Diagnosis present

## 2016-02-26 DIAGNOSIS — R278 Other lack of coordination: Secondary | ICD-10-CM | POA: Diagnosis not present

## 2016-02-26 DIAGNOSIS — Z9849 Cataract extraction status, unspecified eye: Secondary | ICD-10-CM

## 2016-02-26 HISTORY — DX: Unspecified convulsions: R56.9

## 2016-02-26 LAB — COMPREHENSIVE METABOLIC PANEL
ALBUMIN: 3.6 g/dL (ref 3.5–5.0)
ALK PHOS: 60 U/L (ref 38–126)
ALT: 16 U/L (ref 14–54)
ANION GAP: 7 (ref 5–15)
AST: 26 U/L (ref 15–41)
BILIRUBIN TOTAL: 0.8 mg/dL (ref 0.3–1.2)
BUN: 21 mg/dL — AB (ref 6–20)
CALCIUM: 8.9 mg/dL (ref 8.9–10.3)
CO2: 32 mmol/L (ref 22–32)
Chloride: 101 mmol/L (ref 101–111)
Creatinine, Ser: 1.3 mg/dL — ABNORMAL HIGH (ref 0.44–1.00)
GFR calc Af Amer: 40 mL/min — ABNORMAL LOW (ref >60)
GFR, EST NON AFRICAN AMERICAN: 35 mL/min — AB (ref >60)
GLUCOSE: 154 mg/dL — AB (ref 65–99)
Potassium: 4.6 mmol/L (ref 3.5–5.1)
Sodium: 140 mmol/L (ref 135–145)
TOTAL PROTEIN: 7.2 g/dL (ref 6.5–8.1)

## 2016-02-26 LAB — RAPID URINE DRUG SCREEN, HOSP PERFORMED
Amphetamines: NOT DETECTED
BARBITURATES: NOT DETECTED
Benzodiazepines: NOT DETECTED
Cocaine: NOT DETECTED
Opiates: POSITIVE — AB
TETRAHYDROCANNABINOL: NOT DETECTED

## 2016-02-26 LAB — CBG MONITORING, ED: Glucose-Capillary: 108 mg/dL — ABNORMAL HIGH (ref 65–99)

## 2016-02-26 LAB — URINALYSIS, ROUTINE W REFLEX MICROSCOPIC
Bilirubin Urine: NEGATIVE
Glucose, UA: 50 mg/dL — AB
Hgb urine dipstick: NEGATIVE
KETONES UR: NEGATIVE mg/dL
LEUKOCYTES UA: NEGATIVE
NITRITE: NEGATIVE
PROTEIN: NEGATIVE mg/dL
Specific Gravity, Urine: 1.031 — ABNORMAL HIGH (ref 1.005–1.030)
pH: 7 (ref 5.0–8.0)

## 2016-02-26 LAB — DIFFERENTIAL
Basophils Absolute: 0 10*3/uL (ref 0.0–0.1)
Basophils Relative: 0 %
EOS ABS: 0 10*3/uL (ref 0.0–0.7)
EOS PCT: 0 %
LYMPHS ABS: 2.2 10*3/uL (ref 0.7–4.0)
LYMPHS PCT: 23 %
MONOS PCT: 10 %
Monocytes Absolute: 1 10*3/uL (ref 0.1–1.0)
NEUTROS PCT: 67 %
Neutro Abs: 6.5 10*3/uL (ref 1.7–7.7)

## 2016-02-26 LAB — I-STAT CHEM 8, ED
BUN: 27 mg/dL — AB (ref 6–20)
CALCIUM ION: 1.11 mmol/L — AB (ref 1.15–1.40)
CREATININE: 1.2 mg/dL — AB (ref 0.44–1.00)
Chloride: 100 mmol/L — ABNORMAL LOW (ref 101–111)
GLUCOSE: 150 mg/dL — AB (ref 65–99)
HCT: 43 % (ref 36.0–46.0)
HEMOGLOBIN: 14.6 g/dL (ref 12.0–15.0)
Potassium: 4.5 mmol/L (ref 3.5–5.1)
Sodium: 140 mmol/L (ref 135–145)
TCO2: 32 mmol/L (ref 0–100)

## 2016-02-26 LAB — CBC
HEMATOCRIT: 42.9 % (ref 36.0–46.0)
HEMOGLOBIN: 13.5 g/dL (ref 12.0–15.0)
MCH: 29.2 pg (ref 26.0–34.0)
MCHC: 31.5 g/dL (ref 30.0–36.0)
MCV: 92.7 fL (ref 78.0–100.0)
Platelets: 234 10*3/uL (ref 150–400)
RBC: 4.63 MIL/uL (ref 3.87–5.11)
RDW: 15.4 % (ref 11.5–15.5)
WBC: 9.7 10*3/uL (ref 4.0–10.5)

## 2016-02-26 LAB — ETHANOL: Alcohol, Ethyl (B): <5 mg/dL (ref <5)

## 2016-02-26 LAB — PROTIME-INR
INR: 1.06
Prothrombin Time: 13.8 seconds (ref 11.4–15.2)

## 2016-02-26 LAB — APTT: aPTT: 26 seconds (ref 24–36)

## 2016-02-26 LAB — AMMONIA: AMMONIA: 25 umol/L (ref 9–35)

## 2016-02-26 LAB — I-STAT TROPONIN, ED: TROPONIN I, POC: 0.02 ng/mL (ref 0.00–0.08)

## 2016-02-26 MED ORDER — BISACODYL 10 MG RE SUPP: 10.0000 mg | Freq: Every day | RECTAL | Status: DC | PRN

## 2016-02-26 MED ORDER — ONDANSETRON HCL 4 MG PO TABS: 4.0000 mg | ORAL_TABLET | Freq: Four times a day (QID) | ORAL | Status: DC | PRN

## 2016-02-26 MED ORDER — AMIODARONE HCL 200 MG PO TABS
100.0000 mg | ORAL_TABLET | Freq: Every day | ORAL | Status: DC
  Administered 2016-02-27 – 2016-02-29 (×3): 100 mg via ORAL
  Filled 2016-02-26 (×3): qty 1

## 2016-02-26 MED ORDER — TRAZODONE HCL 50 MG PO TABS: 25.0000 mg | ORAL_TABLET | Freq: Every evening | ORAL | Status: DC | PRN

## 2016-02-26 MED ORDER — ONDANSETRON HCL 4 MG/2ML IJ SOLN: 4.0000 mg | Freq: Four times a day (QID) | INTRAMUSCULAR | Status: DC | PRN

## 2016-02-26 MED ORDER — SENNOSIDES-DOCUSATE SODIUM 8.6-50 MG PO TABS: 1.0000 | ORAL_TABLET | Freq: Every evening | ORAL | Status: DC | PRN

## 2016-02-26 MED ORDER — SERTRALINE HCL 50 MG PO TABS
75.0000 mg | ORAL_TABLET | Freq: Every day | ORAL | Status: DC
  Administered 2016-02-27 – 2016-02-29 (×3): 75 mg via ORAL
  Filled 2016-02-26 (×3): qty 2

## 2016-02-26 MED ORDER — PANTOPRAZOLE SODIUM 40 MG PO TBEC
40.0000 mg | DELAYED_RELEASE_TABLET | Freq: Every day | ORAL | Status: DC
  Administered 2016-02-27 – 2016-02-28 (×2): 40 mg via ORAL
  Filled 2016-02-26 (×3): qty 1

## 2016-02-26 MED ORDER — SODIUM CHLORIDE 0.9% FLUSH
3.0000 mL | Freq: Two times a day (BID) | INTRAVENOUS | Status: DC
  Administered 2016-02-27 – 2016-02-28 (×3): 3 mL via INTRAVENOUS

## 2016-02-26 MED ORDER — ACETAMINOPHEN 325 MG PO TABS: 650.0000 mg | ORAL_TABLET | Freq: Four times a day (QID) | ORAL | Status: DC | PRN

## 2016-02-26 MED ORDER — MAGNESIUM CITRATE PO SOLN: 1.0000 | Freq: Once | ORAL | Status: DC | PRN

## 2016-02-26 MED ORDER — HYDRALAZINE HCL 20 MG/ML IJ SOLN: 5.0000 mg | INTRAMUSCULAR | Status: DC | PRN

## 2016-02-26 MED ORDER — HYDROCODONE-ACETAMINOPHEN 5-325 MG PO TABS: 1.0000 | ORAL_TABLET | ORAL | Status: DC | PRN

## 2016-02-26 MED ORDER — DIVALPROEX SODIUM 125 MG PO CSDR
125.0000 mg | DELAYED_RELEASE_CAPSULE | Freq: Two times a day (BID) | ORAL | Status: DC
  Administered 2016-02-27 – 2016-02-29 (×4): 125 mg via ORAL
  Filled 2016-02-26 (×5): qty 1

## 2016-02-26 MED ORDER — LORAZEPAM 0.5 MG PO TABS: 0.2500 mg | ORAL_TABLET | Freq: Every day | ORAL | Status: DC | PRN

## 2016-02-26 MED ORDER — ACETAMINOPHEN 650 MG RE SUPP: 650.0000 mg | Freq: Four times a day (QID) | RECTAL | Status: DC | PRN

## 2016-02-26 MED ORDER — SODIUM CHLORIDE 0.9 % IV SOLN
INTRAVENOUS | Status: DC
  Administered 2016-02-27 – 2016-02-28 (×3): via INTRAVENOUS

## 2016-02-26 NOTE — ED Notes (Addendum)
Pt presented with AMS. Pt a&ox1. Pt has hx of dementia. NIH was 5. Pt uses bedpan. Pt failed swallow screen and speech evaluation has been ordered. Family at bedside. VSS.

## 2016-02-26 NOTE — ED Notes (Signed)
Dr. Marily Memos notified that pts SBP in 190's.

## 2016-02-26 NOTE — ED Triage Notes (Signed)
Pt brought in by EMS due to AMS after eating lunch this afternoon. Pt was LSN at 12:45 and was found by nursing staff at [facility] at 13:15 was was vomiting and altered. Per EMS pt was 82% on room air, pt was 92-94% on 4L. Pt has hx of dementia.

## 2016-02-26 NOTE — ED Notes (Signed)
EKG given to Dr. Goldston  

## 2016-02-26 NOTE — ED Notes (Signed)
Pt's CBG 108.  Informed Whitney, RN.

## 2016-02-26 NOTE — Progress Notes (Signed)
Patient arrived from ED to 5M20. Safety precautions and orders reviewed with pt/family. Daughter at bedside left at this time. TELE applied and confirmed. MD paged.    Ave Filter, RN

## 2016-02-26 NOTE — H&P (Signed)
History and Physical    Tracy Reeves [id number] DOB: 12-05-1922 DOA: 02/26/2016   PCP: [name], MD   Patient coming from:  Home    Chief Complaint: Confusion, possible seizure   HPI: Tracy Reeves is a 82 y.o. female a with advanced dementia, and speech difficulties at baseline, presenting with acute on chronic altered mental status. Patient was eating breakfast this morning, when she began to be less responsive, and have an episode of emesis. She was transferred to the hospital by EMS, at which time code stroke was initiated. Since that time, the patient's overall mental status improved. Of note, neurology has seen the patient, MD exam was essentially unremarkable, with the exception of right hemianopia ., although these finding is unclear if his new or old. The patient has a history of frequent UTIs, last on 01/23/16  no apparent and respiratory or cardiac complaints. Daughter reports that she does have frequent false, but no history of syncope or presyncope in the past. Daughter denies any history of seizures in this patient. No other recent infections or take bites. She has decreased appetite, and has had decreased fluid oral intake lately. Last known normal at 12:45 PM.    ED Course:  BP 190/72   Pulse 68   Temp 98.1 F (36.7 C) (Oral)   Resp 14   Ht 5' (1.524 m)   Wt 52.2 kg (115 lb)   SpO2 94%   BMI 22.46 kg/m    creatinine 1.2 glucose 150 troponin 0.02 white count 9.7 hemoglobin 14.6 platelets 234 PT 3.8 INR 1.06 CT of the head negative for acute intracranial hemorrhage. EKG Afib without ACS current urinalysis is pending. MRI of the brain, EEG is pending.  Review of Systems: As per HPI otherwise unable to provide further history the to the altered mental status.  Past Medical History:  Diagnosis Date  . Dementia   . Fall at nursing home   . Humeral fracture 09/03/2014  . Hypertension   . Renal insufficiency   . Seizure ([place])   . Shingles   .  Syncope   . TBI (traumatic brain injury) ([place])   . Thyroid activity decreased   . Thyroid disease     Past Surgical History:  Procedure Laterality Date  . ABDOMINAL HYSTERECTOMY    . CATARACT EXTRACTION      Social History Social History   Social History  . Marital status: Unknown    Spouse name: N/A  . Number of children: N/A  . Years of education: N/A   Occupational History  . Not on file.   Social History Main Topics  . Smoking status: Never Smoker  . Smokeless tobacco: Never Used  . Alcohol use No  . Drug use: No  . Sexual activity: Not on file   Other Topics Concern  . Not on file   Social History Narrative  . No narrative on file     Allergies  Allergen Reactions  . Ace Inhibitors Other (See Comments)  . Other Other (See Comments)    Uncoded Allergy. Allergen: eggs, Other Reaction: GI Upset  . Eggs Or Egg-Derived Products Other (See Comments)    Unknown reaction    Family History  Problem Relation Age of Onset  . Family history unknown: Yes      Prior to Admission medications   Medication Sig Start Date End Date Taking? Authorizing Provider  acetaminophen (TYLENOL) 500 MG tablet Take 500 mg by mouth 2 (two) times daily.  Yes Historical Provider, MD  amiodarone (PACERONE) 200 MG tablet Take 100 mg by mouth daily.    Yes Historical Provider, MD  dextromethorphan-guaiFENesin (TUSSIN DM) 10-100 MG/5ML liquid Take 15 mLs by mouth 2 (two) times daily as needed for cough.   Yes Historical Provider, MD  divalproex (DEPAKOTE SPRINKLE) 125 MG capsule Take 125 mg by mouth 2 (two) times daily.    Yes Historical Provider, MD  HYDROcodone-acetaminophen (NORCO/VICODIN) 5-325 MG tablet Take 0.5 tablets by mouth 2 (two) times daily as needed for moderate pain or severe pain.   Yes Historical Provider, MD  LORazepam (ATIVAN) 0.5 MG tablet Take 0.25 mg by mouth daily as needed for anxiety (agitation).    Yes Historical Provider, MD  omeprazole (PRILOSEC) 40 MG  capsule Take 40 mg by mouth daily.   Yes Historical Provider, MD  Polyethyl Glycol-Propyl Glycol (SYSTANE) 0.4-0.3 % GEL ophthalmic gel Place 1 application into both eyes daily.   Yes Historical Provider, MD  polyethylene glycol (MIRALAX / GLYCOLAX) packet Take 17 g by mouth daily as needed for moderate constipation.    Yes Historical Provider, MD  sertraline (ZOLOFT) 25 MG tablet Take 25 mg by mouth daily.   Yes Historical Provider, MD  sertraline (ZOLOFT) 50 MG tablet Take 50 mg by mouth daily.   Yes Historical Provider, MD    Physical Exam:    Vitals:   02/26/16 1745 02/26/16 1800 02/26/16 1815 02/26/16 1830  BP: 196/60 194/71 195/77 190/72  Pulse: 65 66 69 68  Resp: 16 15 18 14   Temp:      TempSrc:      SpO2: 96% 97% 97% 94%  Weight:      Height:           Constitutional: NAD, calmSound asleep at this time. Vitals:   02/26/16 1745 02/26/16 1800 02/26/16 1815 02/26/16 1830  BP: 196/60 194/71 195/77 190/72  Pulse: 65 66 69 68  Resp: 16 15 18 14   Temp:      TempSrc:      SpO2: 96% 97% 97% 94%  Weight:      Height:       Eyes: PERRL, lids and conjunctivae normal ENMT: Mucous membranes are moist. Posterior pharynx clear of any exudate or lesions.Normal dentition.  Neck: normal, supple, no masses, no thyromegaly Respiratory: clear to auscultation bilaterally, no wheezing, no crackles. Normal respiratory effort. No accessory muscle use.  Cardiovascular: Regular rate and rhythm, no murmurs / rubs / gallops. No extremity edema. 2+ pedal pulses. No carotid bruits.  Abdomen: no tenderness, no masses palpated. No hepatosplenomegaly. Bowel sounds positive.  Musculoskeletal: no clubbing / cyanosis. No joint deformity upper and lower extremities. Good ROM, no contractures. Normal muscle tone.  Skin: no rashes, lesions, ulcers.  Neurologic as per neurology. The report, the patient is unable to engage in conversation with appropriate words. Rest of the exam unable to perform, the  patient is sound asleep at this time. Psychiatric: Normal judgment and insight. Alert and oriented x 3. Normal mood.     Labs on Admission: I have personally reviewed following labs and imaging studies  CBC:  Recent Labs Lab 02/26/16 1345 02/26/16 1351  WBC 9.7  --   NEUTROABS 6.5  --   HGB 13.5 14.6  HCT 42.9 43.0  MCV 92.7  --   PLT 234  --     Basic Metabolic Panel:  Recent Labs Lab 02/26/16 1345 02/26/16 1351  NA 140 140  K 4.6 4.5  CL  101 100*  CO2 32  --   GLUCOSE 154* 150*  BUN 21* 27*  CREATININE 1.30* 1.20*  CALCIUM 8.9  --     GFR: Estimated Creatinine Clearance: 21.9 mL/min (by C-G formula based on SCr of 1.2 mg/dL (H)).  Liver Function Tests:  Recent Labs Lab 02/26/16 1345  AST 26  ALT 16  ALKPHOS 60  BILITOT 0.8  PROT 7.2  ALBUMIN 3.6   No results for input(s): LIPASE, AMYLASE in the last 168 hours.  Recent Labs Lab 02/26/16 1649  AMMONIA 25    Coagulation Profile:  Recent Labs Lab 02/26/16 1345  INR 1.06    Cardiac Enzymes: No results for input(s): CKTOTAL, CKMB, CKMBINDEX, TROPONINI in the last 168 hours.  BNP (last 3 results) No results for input(s): PROBNP in the last 8760 hours.  HbA1C: No results for input(s): HGBA1C in the last 72 hours.  CBG:  Recent Labs Lab 02/26/16 1340  GLUCAP 108*    Lipid Profile: No results for input(s): CHOL, HDL, LDLCALC, TRIG, CHOLHDL, LDLDIRECT in the last 72 hours.  Thyroid Function Tests: No results for input(s): TSH, T4TOTAL, FREET4, T3FREE, THYROIDAB in the last 72 hours.  Anemia Panel: No results for input(s): VITAMINB12, FOLATE, FERRITIN, TIBC, IRON, RETICCTPCT in the last 72 hours.  Urine analysis:    Component Value Date/Time   COLORURINE YELLOW 02/26/2016 1537   APPEARANCEUR CLEAR 02/26/2016 1537   LABSPEC 1.031 (H) 02/26/2016 1537   PHURINE 7.0 02/26/2016 1537   GLUCOSEU 50 (A) 02/26/2016 1537   HGBUR NEGATIVE 02/26/2016 1537   BILIRUBINUR NEGATIVE  02/26/2016 1537   KETONESUR NEGATIVE 02/26/2016 1537   PROTEINUR NEGATIVE 02/26/2016 1537   NITRITE NEGATIVE 02/26/2016 1537   LEUKOCYTESUR NEGATIVE 02/26/2016 1537    Sepsis Labs: @LABRCNTIP (procalcitonin:4,lacticidven:4) )No results found for this or any previous visit (from the past 240 hour(s)).   Radiological Exams on Admission: Ct Angio Head W Or Wo Contrast  Result Date: 02/26/2016 CLINICAL DATA:  82 year old female code stroke. Language deficit. Initial encounter. EXAM: CT ANGIOGRAPHY HEAD AND NECK TECHNIQUE: Multidetector CT imaging of the head and neck was performed using the standard protocol during bolus administration of intravenous contrast. Multiplanar CT image reconstructions and MIPs were obtained to evaluate the vascular anatomy. Carotid stenosis measurements (when applicable) are obtained utilizing NASCET criteria, using the distal internal carotid diameter as the denominator. CONTRAST:  50 mL Isovue 370. COMPARISON:  Head CT without contrast 1343 hours today. FINDINGS: CTA NECK Skeleton: Degenerative changes in the spine. Up to severe cervical facet arthropathy greater on the right. Exaggerated upper thoracic kyphosis. No acute osseous abnormality identified. Visualized paranasal sinuses and mastoids are stable and well pneumatized. Upper chest: Negative lung apices. Negative visualized superior mediastinum. Other neck: Thyromegaly. Subcentimeter bilateral thyroid nodules. No significant mass effect on the airway. Negative larynx, pharynx, parapharyngeal spaces, retropharyngeal space, sublingual space, submandibular glands and parotid glands. No cervical lymphadenopathy. No acute orbit or scalp soft tissue findings. Aortic arch: 3 vessel arch configuration, the entire arch is not included. No great vessel origin stenosis suspected. Right carotid system: Negative right cervical carotids aside from tortuosity and mild soft and calcified plaque at the right carotid bifurcation. Left  carotid system: Tortuous left CCA without stenosis proximal to the bifurcation. Mild to moderate soft and calcified plaque at the left carotid bifurcation. Stenosis is less than 50 % with respect to the distal vessel. Tortuous left ICA distal to the bulb. No stenosis. Vertebral arteries:New line no proximal right subclavian or  right vertebral artery stenosis. Tortuous but otherwise negative cervical right vertebral artery. No proximal left subclavian artery stenosis despite soft plaque. Normal left vertebral artery origin. Tortuous left V1 segment. No left vertebral artery stenosis in the neck. CTA HEAD Posterior circulation: Patent distal vertebral arteries without stenosis. Mild left V4 segment calcified plaque. Patent PICA origins. No basilar stenosis. Normal SCA and left PCA origins. Fetal type right PCA origin. Left posterior communicating artery diminutive or absent. Bilateral PCA branches are within normal limits. Anterior circulation: Both ICA siphons are patent. Mild to moderate calcified siphon atherosclerosis. Only mild subsequent supraclinoid segment stenosis bilaterally. Ophthalmic and right posterior communicating artery origins are normal. Patent carotid termini. Normal MCA and ACA origins aside from mild calcified plaque. Diminutive or absent anterior communicating artery. Bilateral ACA branches are within normal limits. Right MCA branches are within normal limits. Left MCA M1 segment, left MCA bifurcation, and left MCA branches are within normal limits. Venous sinuses: Not well evaluated due to early contrast timing on these images. Anatomic variants: Fetal type right PCA origin. Review of the MIP images confirms the above findings IMPRESSION: 1. Negative for emergent large vessel occlusion. Preliminary report of this was relayed via text pager to Dr. Roland Rack on 02/26/2016 at 1407 hours. 2. Mild for age mostly calcified atherosclerosis in the head and neck. No hemodynamically significant  arterial stenosis identified. 3. Thyromegaly compatible with benign goiter. Electronically Signed   By: Genevie Ann M.D.   On: 02/26/2016 14:17   Ct Angio Neck W Or Wo Contrast  Result Date: 02/26/2016 CLINICAL DATA:  82 year old female code stroke. Language deficit. Initial encounter. EXAM: CT ANGIOGRAPHY HEAD AND NECK TECHNIQUE: Multidetector CT imaging of the head and neck was performed using the standard protocol during bolus administration of intravenous contrast. Multiplanar CT image reconstructions and MIPs were obtained to evaluate the vascular anatomy. Carotid stenosis measurements (when applicable) are obtained utilizing NASCET criteria, using the distal internal carotid diameter as the denominator. CONTRAST:  50 mL Isovue 370. COMPARISON:  Head CT without contrast 1343 hours today. FINDINGS: CTA NECK Skeleton: Degenerative changes in the spine. Up to severe cervical facet arthropathy greater on the right. Exaggerated upper thoracic kyphosis. No acute osseous abnormality identified. Visualized paranasal sinuses and mastoids are stable and well pneumatized. Upper chest: Negative lung apices. Negative visualized superior mediastinum. Other neck: Thyromegaly. Subcentimeter bilateral thyroid nodules. No significant mass effect on the airway. Negative larynx, pharynx, parapharyngeal spaces, retropharyngeal space, sublingual space, submandibular glands and parotid glands. No cervical lymphadenopathy. No acute orbit or scalp soft tissue findings. Aortic arch: 3 vessel arch configuration, the entire arch is not included. No great vessel origin stenosis suspected. Right carotid system: Negative right cervical carotids aside from tortuosity and mild soft and calcified plaque at the right carotid bifurcation. Left carotid system: Tortuous left CCA without stenosis proximal to the bifurcation. Mild to moderate soft and calcified plaque at the left carotid bifurcation. Stenosis is less than 50 % with respect to the  distal vessel. Tortuous left ICA distal to the bulb. No stenosis. Vertebral arteries:New line no proximal right subclavian or right vertebral artery stenosis. Tortuous but otherwise negative cervical right vertebral artery. No proximal left subclavian artery stenosis despite soft plaque. Normal left vertebral artery origin. Tortuous left V1 segment. No left vertebral artery stenosis in the neck. CTA HEAD Posterior circulation: Patent distal vertebral arteries without stenosis. Mild left V4 segment calcified plaque. Patent PICA origins. No basilar stenosis. Normal SCA and left PCA origins. Fetal  type right PCA origin. Left posterior communicating artery diminutive or absent. Bilateral PCA branches are within normal limits. Anterior circulation: Both ICA siphons are patent. Mild to moderate calcified siphon atherosclerosis. Only mild subsequent supraclinoid segment stenosis bilaterally. Ophthalmic and right posterior communicating artery origins are normal. Patent carotid termini. Normal MCA and ACA origins aside from mild calcified plaque. Diminutive or absent anterior communicating artery. Bilateral ACA branches are within normal limits. Right MCA branches are within normal limits. Left MCA M1 segment, left MCA bifurcation, and left MCA branches are within normal limits. Venous sinuses: Not well evaluated due to early contrast timing on these images. Anatomic variants: Fetal type right PCA origin. Review of the MIP images confirms the above findings IMPRESSION: 1. Negative for emergent large vessel occlusion. Preliminary report of this was relayed via text pager to Dr. Roland Rack on 02/26/2016 at 1407 hours. 2. Mild for age mostly calcified atherosclerosis in the head and neck. No hemodynamically significant arterial stenosis identified. 3. Thyromegaly compatible with benign goiter. Electronically Signed   By: Genevie Ann M.D.   On: 02/26/2016 14:17   Dg Chest Portable 1 View  Result Date:  02/26/2016 CLINICAL DATA:  Hypoxia. EXAM: PORTABLE CHEST 1 VIEW COMPARISON:  One-view chest x-ray 11/07/2014. FINDINGS: The heart is enlarged. Atherosclerotic calcifications are present at the aortic arch. There is no edema or effusion to suggest failure. No focal airspace disease is present. Emphysematous changes are again noted. A remote left humerus fracture is noted with probable pseudoarticulation. Advanced degenerative changes are noted at the right shoulder. The visualized soft tissues and bony thorax are otherwise unremarkable. IMPRESSION: 1. No acute cardiopulmonary disease. 2. Cardiomegaly without failure. 3. Aortic atherosclerosis. 4. Emphysema. 5. Remote left humerus fracture with probable pseudoarticulation. Electronically Signed   By: San Morelle M.D.   On: 02/26/2016 14:39   Ct Head Code Stroke W/o Cm  Result Date: 02/26/2016 CLINICAL DATA:  Code stroke. 82 year old female with aphasia. Last seen normal 1245 hours. Initial encounter. EXAM: CT HEAD WITHOUT CONTRAST TECHNIQUE: Contiguous axial images were obtained from the base of the skull through the vertex without intravenous contrast. COMPARISON:  Head CT without contrast 01/23/2016 and earlier. FINDINGS: Brain: Stable cerebral volume. No midline shift, mass effect, or evidence of intracranial mass lesion. Stable ventricle size and configuration. Confluent cerebral white matter hypodensity is stable. Small areas of cortical encephalomalacia along the lateral left temporal lobe and lateral left occipital lobe appear stable. More pronounced encephalomalacia along the anterior right temporal lobe appears stable. No acute intracranial hemorrhage identified. No cortically based acute infarct identified. Vascular: No suspicious intracranial vascular hyperdensity. Skull: No acute osseous abnormality identified. Sinuses/Orbits: Visualized paranasal sinuses and mastoids are stable and well pneumatized. Other: No acute orbit or scalp soft tissue  finding. ASPECTS [facility] Stroke Program Early CT Score) - Ganglionic level infarction (caudate, lentiform nuclei, internal capsule, insula, M1-M3 cortex): 7 - Supraganglionic infarction (M4-M6 cortex): 3 Total score (0-10 with 10 being normal): 10 IMPRESSION: 1. No acute cortically based infarct or acute intracranial hemorrhage. 2. ASPECTS is 10. 3. Stable encephalomalacia along the anterior right temporal lobe and lateral left temporal and occipital lobes. 4. The above was relayed via text pager to Dr. Roland Rack on 02/26/2016 at 13:52 . Electronically Signed   By: Genevie Ann M.D.   On: 02/26/2016 13:52    EKG: Independently reviewed.  Assessment/Plan Active Problems:   Hypertension   Hypothyroid   Depression   Hypothyroidism due to acquired atrophy of thyroid  Essential hypertension   Altered mental status   Acute encephalopathy with suspected presyncope versus ?aspiration . Etiology unclear. Concern for seizures versus CVA vs sepsis vs UTI. EkG NAD  No hypoxemia  T max CT of the head without acute abnormality. MRI is pending . Afebrile WBC 9.7  Neuro unconvinced this is stroke related  Admit to telemetry obs  Ammonia levels  Urine culture IVF  Will await EEG and MRI.  SlP   Hypertension BP 146/68   Pulse 66   Temp 98.1   Controlled Continue home anti-hypertensive medications when able to swallow   Hypothyroidism: -Continue home Synthroid  Atrial Fibrillation    Rate controlled -Continue meds  Histroy of Seizures on Depakote, - Depakote level - Seizure precautions  Depression Continue home ZOloft    DVT prophylaxis:  Heparin  Code Status:   DNR Family Communication:  Discussed with daughter  Disposition Plan: Expect patient to be discharged to home after condition improves Consults called:    Neuro  Admission status:Tele  Obs    Sharene Butters, PA-C [facility]   02/26/2016, 6:44 PM   Attending MD note  Patient was seen, examined,treatment plan  was discussed with the  Advance Practice Provider.  I have personally reviewed the clinical findings, lab,EKG, imaging studies and management of this patient in detail.I have also reviewed the orders written for this patient which were under my direction. I agree with the documentation, as recorded by the Advance Practice Provider.   Sheenna Barnick is a 82 y.o. female  who presents with AMS and possible syncopal episode from possible seizure vs aspiration from emesis or vagal episode.. Doubt stroke but will follow recs of NEuro team. No evidence of Sepsis or UTI.  Of note family history unable to be ubtained at this time given pts condition.    MERRELL, Grayling Congress, MD Family Medicine See Amion for pager # [facility]

## 2016-02-26 NOTE — Consult Note (Signed)
Neurology Consultation Reason for Consult: Altered mental status Referring Physician: Regenia Skeeter, S  CC: Altered mental status  History is obtained from: EMS  HPI: Tracy Reeves is a 82 y.o. female a history of fairly advanced dementia as well as some difficulty with speech (daughter describes word salad) at baseline who presents after an episode of altered mental status. She apparently had emesis after saying that she did not feel well at lunchtime and following this became essentially unresponsive. She was transported by EMS reactivated code stroke and on arrival was not speaking very much. Over the course of her initial evaluation, she did begin speaking more the time of her daughter's arrival she seems pretty much back to baseline.  She was found to have a right hemianopia, however on review of the CT is unclear if this is old or new.  LKW: 12:45 PM tpa given?: no, rapidly improving symptoms   ROS:  Unable to obtain due to altered mental status.   Past Medical History:  Diagnosis Date  . Dementia   . Fall at nursing home   . Humeral fracture 09/03/2014  . Hypertension   . Renal insufficiency   . Shingles   . Syncope   . TBI (traumatic brain injury) ([place])   . Thyroid activity decreased   . Thyroid disease      Family history: Unable to obtain due to altered mental status   Social History:  reports that she has never smoked. She has never used smokeless tobacco. She reports that she does not drink alcohol or use drugs.   Exam: Current vital signs: BP 122/74   Pulse 65   Temp 98.1 F (36.7 C) (Oral)   Resp 18   Ht 5' (1.524 m)   Wt 52.2 kg (115 lb)   SpO2 96%   BMI 22.46 kg/m  Vital signs in last 24 hours: Temp:  [98.1 F (36.7 C)] 98.1 F (36.7 C) (12/06 1418) Pulse Rate:  [65-70] 65 (12/06 1515) Resp:  [15-21] 18 (12/06 1515) BP: (122-156)/(58-96) 122/74 (12/06 1515) SpO2:  [89 %-100 %] 96 % (12/06 1515) Weight:  [52.2 kg (115 lb)] 52.2 kg (115 lb) (12/06  1300)   Physical Exam  Constitutional: Appears elderly Psych: Affect appropriate to situation Eyes: No scleral injection HENT: No OP obstrucion Head: Normocephalic.  Cardiovascular: Normal rate and regular rhythm.  Respiratory: Effort normal and breath sounds normal to anterior ascultation GI: Soft.  No distension. There is no tenderness.  Skin: WDI  Neuro: Mental Status: Patient is awake, alert, she is able to tell me her name. The majority of her responses are inappropriate, but she is able to say her daughter's name. She says I don't know to a lot of questions, and when asked what color my pen is (it is blue) she responds that "it is red." Cranial Nerves: II: She appears to have a right hemianopia, though due to lack of cooperation this is difficult to be certain about Pupils are equal, round, and reactive to light.   III,IV, VI: EOMI without ptosis or diploplia.  V: Facial sensation is symmetric to temperature VII: Facial movement is difficult to ascertain given patient cooperation , but nasolabial folds appear grossly symmetric VIII: hearing is intact to voice X: Uvula elevates symmetrically XI: Shoulder shrug is symmetric. XII: tongue is midline without atrophy or fasciculations.  Motor: She keeps her fists clenched until instructed not to, able to hold all extremities without drift Sensory: Sensation is symmetric to pin Cerebellar: No clear  ataxia  I have reviewed labs in epic and the results pertinent to this consultation are: Cr 1.3  I have reviewed the images obtained: CT head-no acute findings, multiple old injuries  Impression: 82 year old female with altered mental status after vomiting. Possibilities include syncope due to vagal event, hypoxia(patient was hypoxic on EMS arrival), seizure, stroke. I do not have reason to suspect stroke over the other events, and would not perform a stroke workup unless the MRI was positive.  Recommendations:  1) MRI brain 2)  EEG 3) neurology will follow   Roland Rack, MD [facility] [phone]  If 7pm- 7am, please page neurology on call as listed in [place].

## 2016-02-26 NOTE — ED Provider Notes (Signed)
[facility] DEPT Provider Note   CSN: [id number] Arrival date & time: 02/26/16  1335  LEVEL 5 CAVEAT - DEMENTIA  An emergency department physician performed an initial assessment on this suspected stroke patient at 1337.  History   Chief Complaint Chief Complaint  Patient presents with  . Code Stroke    HPI Tracy Reeves is a 82 y.o. female.  HPI  82 year old presents with acute altered mental status. History sacrum from EMS. History is limited due to dementia and mental status changes. EMS reports the patient was last seen well at around 12:45 PM and then when they checked on her again she was altered and less responsive. Had speech changes. Vomited once and EMS reports an O2 sat of 83% on room air with a good waveform. Came up with oxygen. No specific complaints by the patient. Did not find any focal deficits.   Past Medical History:  Diagnosis Date  . Dementia   . Fall at nursing home   . Humeral fracture 09/03/2014  . Hypertension   . Renal insufficiency   . Shingles   . Syncope   . TBI (traumatic brain injury) ([place])   . Thyroid activity decreased   . Thyroid disease     Patient Active Problem List   Diagnosis Date Noted  . Fracture closed, humerus 09/04/2014  . Proximal humeral fracture   . Hygroma   . Humeral surgical neck fracture 09/03/2014  . V tach ([place]) 09/03/2014  . Subdural hygroma 09/03/2014  . Laceration of scalp without complication   . Syncope and collapse 08/31/2014  . Depression 07/01/2014  . Hypothyroidism due to acquired atrophy of thyroid 07/01/2014  . Essential hypertension 07/01/2014  . Vascular dementia without behavioral disturbance 06/19/2014  . Hypertension   . Hypothyroid   . Fall 05/23/2014  . TBI (traumatic brain injury) ([place]) 05/20/2014    Past Surgical History:  Procedure Laterality Date  . ABDOMINAL HYSTERECTOMY    . CATARACT EXTRACTION      OB History    No data available       Home Medications    Prior to  Admission medications   Medication Sig Start Date End Date Taking? Authorizing Provider  acetaminophen (TYLENOL) 500 MG tablet Take 500 mg by mouth 2 (two) times daily as needed for mild pain.    Historical Provider, MD  amiodarone (PACERONE) 200 MG tablet Take 100 mg by mouth daily.     Historical Provider, MD  amLODipine (NORVASC) 5 MG tablet Take 0.5 tablets (2.5 mg total) by mouth daily. Patient not taking: Reported on 12/27/2014 09/05/14   [name], MD  cephALEXin (KEFLEX) 500 MG capsule Take 1 capsule (500 mg total) by mouth 4 (four) times daily. 01/23/16   Dorie Rank, MD  dextromethorphan-guaiFENesin (ROBITUSSIN-DM) 10-100 MG/5ML liquid Take 15 mLs by mouth 2 (two) times daily as needed for cough.    Historical Provider, MD  divalproex (DEPAKOTE SPRINKLE) 125 MG capsule Take 125 mg by mouth at bedtime.     Historical Provider, MD  HYDROcodone-acetaminophen (NORCO/VICODIN) 5-325 MG tablet Take 0.5 tablets by mouth 2 (two) times daily as needed for moderate pain or severe pain.    Historical Provider, MD  LORazepam (ATIVAN) 0.5 MG tablet Take 0.25 mg by mouth daily as needed for anxiety (agitation).     Historical Provider, MD  Polyethyl Glycol-Propyl Glycol (SYSTANE) 0.4-0.3 % SOLN Apply 1 drop to eye daily.    Historical Provider, MD  polyethylene glycol (MIRALAX / GLYCOLAX) packet  Take 17 g by mouth daily.    Historical Provider, MD  sertraline (ZOLOFT) 25 MG tablet Take 25 mg by mouth daily.    Historical Provider, MD  sertraline (ZOLOFT) 50 MG tablet Take 50 mg by mouth daily.    Historical Provider, MD  sulfamethoxazole-trimethoprim (BACTRIM DS,SEPTRA DS) 800-160 MG tablet Take 1 tablet by mouth 2 (two) times daily. 01/15/16   Historical Provider, MD    Family History No family history on file.  Social History Social History  Substance Use Topics  . Smoking status: Never Smoker  . Smokeless tobacco: Never Used  . Alcohol use No     Allergies   Other; Ace inhibitors; and  Eggs or egg-derived products   Review of Systems Review of Systems  Unable to perform ROS: Dementia     Physical Exam Updated Vital Signs BP 122/74   Pulse 65   Temp 98.1 F (36.7 C) (Oral)   Resp 18   Ht 5' (1.524 m)   Wt 115 lb (52.2 kg)   SpO2 96%   BMI 22.46 kg/m   Physical Exam  Constitutional: She appears well-developed and well-nourished.  HENT:  Head: Normocephalic and atraumatic.  Right Ear: External ear normal.  Left Ear: External ear normal.  Nose: Nose normal.  Eyes: Right eye exhibits no discharge. Left eye exhibits no discharge.  Cardiovascular: Normal rate, regular rhythm and normal heart sounds.   Pulmonary/Chest: Effort normal and breath sounds normal.  Abdominal: Soft. There is no tenderness.  Neurological: She is alert. She is disoriented.  Awake, alert, confused and words don't seem to make sense concerning questions. Occasionally just says "A, B, C" or "1, 2, 3". Equal strength in all 4 extremities  Skin: Skin is warm and dry.  Nursing note and vitals reviewed.    ED Treatments / Results  Labs (all labs ordered are listed, but only abnormal results are displayed) Labs Reviewed  COMPREHENSIVE METABOLIC PANEL - Abnormal; Notable for the following:       Result Value   Glucose, Bld 154 (*)    BUN 21 (*)    Creatinine, Ser 1.30 (*)    GFR calc non Af Amer 35 (*)    GFR calc Af Amer 40 (*)    All other components within normal limits  I-STAT CHEM 8, ED - Abnormal; Notable for the following:    Chloride 100 (*)    BUN 27 (*)    Creatinine, Ser 1.20 (*)    Glucose, Bld 150 (*)    Calcium, Ion 1.11 (*)    All other components within normal limits  CBG MONITORING, ED - Abnormal; Notable for the following:    Glucose-Capillary 108 (*)    All other components within normal limits  ETHANOL  PROTIME-INR  APTT  CBC  DIFFERENTIAL  RAPID URINE DRUG SCREEN, HOSP PERFORMED  URINALYSIS, ROUTINE W REFLEX MICROSCOPIC  I-STAT TROPOININ, ED     EKG  EKG Interpretation  Date/Time:  Wednesday February 26 2016 14:12:43 EST Ventricular Rate:  133 PR Interval:    QRS Duration: 144 QT Interval:  400 QTC Calculation: 554 R Axis:   -2 Text Interpretation:  likely sinus rhythm Nonspecific intraventricular conduction delay Artifact in lead(s) I II III aVR aVL aVF V1 V2 V3 V4 V5 V6 and baseline wander in lead(s) V2 artifact limits interpretation Confirmed by Amed Datta MD, Riane Rung [phone]) on 02/26/2016 2:57:57 PM       Radiology Ct Angio Head W Or Wo Contrast  Result Date: 02/26/2016 CLINICAL DATA:  82 year old female code stroke. Language deficit. Initial encounter. EXAM: CT ANGIOGRAPHY HEAD AND NECK TECHNIQUE: Multidetector CT imaging of the head and neck was performed using the standard protocol during bolus administration of intravenous contrast. Multiplanar CT image reconstructions and MIPs were obtained to evaluate the vascular anatomy. Carotid stenosis measurements (when applicable) are obtained utilizing NASCET criteria, using the distal internal carotid diameter as the denominator. CONTRAST:  50 mL Isovue 370. COMPARISON:  Head CT without contrast 1343 hours today. FINDINGS: CTA NECK Skeleton: Degenerative changes in the spine. Up to severe cervical facet arthropathy greater on the right. Exaggerated upper thoracic kyphosis. No acute osseous abnormality identified. Visualized paranasal sinuses and mastoids are stable and well pneumatized. Upper chest: Negative lung apices. Negative visualized superior mediastinum. Other neck: Thyromegaly. Subcentimeter bilateral thyroid nodules. No significant mass effect on the airway. Negative larynx, pharynx, parapharyngeal spaces, retropharyngeal space, sublingual space, submandibular glands and parotid glands. No cervical lymphadenopathy. No acute orbit or scalp soft tissue findings. Aortic arch: 3 vessel arch configuration, the entire arch is not included. No great vessel origin stenosis suspected.  Right carotid system: Negative right cervical carotids aside from tortuosity and mild soft and calcified plaque at the right carotid bifurcation. Left carotid system: Tortuous left CCA without stenosis proximal to the bifurcation. Mild to moderate soft and calcified plaque at the left carotid bifurcation. Stenosis is less than 50 % with respect to the distal vessel. Tortuous left ICA distal to the bulb. No stenosis. Vertebral arteries:New line no proximal right subclavian or right vertebral artery stenosis. Tortuous but otherwise negative cervical right vertebral artery. No proximal left subclavian artery stenosis despite soft plaque. Normal left vertebral artery origin. Tortuous left V1 segment. No left vertebral artery stenosis in the neck. CTA HEAD Posterior circulation: Patent distal vertebral arteries without stenosis. Mild left V4 segment calcified plaque. Patent PICA origins. No basilar stenosis. Normal SCA and left PCA origins. Fetal type right PCA origin. Left posterior communicating artery diminutive or absent. Bilateral PCA branches are within normal limits. Anterior circulation: Both ICA siphons are patent. Mild to moderate calcified siphon atherosclerosis. Only mild subsequent supraclinoid segment stenosis bilaterally. Ophthalmic and right posterior communicating artery origins are normal. Patent carotid termini. Normal MCA and ACA origins aside from mild calcified plaque. Diminutive or absent anterior communicating artery. Bilateral ACA branches are within normal limits. Right MCA branches are within normal limits. Left MCA M1 segment, left MCA bifurcation, and left MCA branches are within normal limits. Venous sinuses: Not well evaluated due to early contrast timing on these images. Anatomic variants: Fetal type right PCA origin. Review of the MIP images confirms the above findings IMPRESSION: 1. Negative for emergent large vessel occlusion. Preliminary report of this was relayed via text pager to Dr.  Roland Rack on 02/26/2016 at 1407 hours. 2. Mild for age mostly calcified atherosclerosis in the head and neck. No hemodynamically significant arterial stenosis identified. 3. Thyromegaly compatible with benign goiter. Electronically Signed   By: Genevie Ann M.D.   On: 02/26/2016 14:17   Ct Angio Neck W Or Wo Contrast  Result Date: 02/26/2016 CLINICAL DATA:  82 year old female code stroke. Language deficit. Initial encounter. EXAM: CT ANGIOGRAPHY HEAD AND NECK TECHNIQUE: Multidetector CT imaging of the head and neck was performed using the standard protocol during bolus administration of intravenous contrast. Multiplanar CT image reconstructions and MIPs were obtained to evaluate the vascular anatomy. Carotid stenosis measurements (when applicable) are obtained utilizing NASCET criteria, using the distal  internal carotid diameter as the denominator. CONTRAST:  50 mL Isovue 370. COMPARISON:  Head CT without contrast 1343 hours today. FINDINGS: CTA NECK Skeleton: Degenerative changes in the spine. Up to severe cervical facet arthropathy greater on the right. Exaggerated upper thoracic kyphosis. No acute osseous abnormality identified. Visualized paranasal sinuses and mastoids are stable and well pneumatized. Upper chest: Negative lung apices. Negative visualized superior mediastinum. Other neck: Thyromegaly. Subcentimeter bilateral thyroid nodules. No significant mass effect on the airway. Negative larynx, pharynx, parapharyngeal spaces, retropharyngeal space, sublingual space, submandibular glands and parotid glands. No cervical lymphadenopathy. No acute orbit or scalp soft tissue findings. Aortic arch: 3 vessel arch configuration, the entire arch is not included. No great vessel origin stenosis suspected. Right carotid system: Negative right cervical carotids aside from tortuosity and mild soft and calcified plaque at the right carotid bifurcation. Left carotid system: Tortuous left CCA without stenosis  proximal to the bifurcation. Mild to moderate soft and calcified plaque at the left carotid bifurcation. Stenosis is less than 50 % with respect to the distal vessel. Tortuous left ICA distal to the bulb. No stenosis. Vertebral arteries:New line no proximal right subclavian or right vertebral artery stenosis. Tortuous but otherwise negative cervical right vertebral artery. No proximal left subclavian artery stenosis despite soft plaque. Normal left vertebral artery origin. Tortuous left V1 segment. No left vertebral artery stenosis in the neck. CTA HEAD Posterior circulation: Patent distal vertebral arteries without stenosis. Mild left V4 segment calcified plaque. Patent PICA origins. No basilar stenosis. Normal SCA and left PCA origins. Fetal type right PCA origin. Left posterior communicating artery diminutive or absent. Bilateral PCA branches are within normal limits. Anterior circulation: Both ICA siphons are patent. Mild to moderate calcified siphon atherosclerosis. Only mild subsequent supraclinoid segment stenosis bilaterally. Ophthalmic and right posterior communicating artery origins are normal. Patent carotid termini. Normal MCA and ACA origins aside from mild calcified plaque. Diminutive or absent anterior communicating artery. Bilateral ACA branches are within normal limits. Right MCA branches are within normal limits. Left MCA M1 segment, left MCA bifurcation, and left MCA branches are within normal limits. Venous sinuses: Not well evaluated due to early contrast timing on these images. Anatomic variants: Fetal type right PCA origin. Review of the MIP images confirms the above findings IMPRESSION: 1. Negative for emergent large vessel occlusion. Preliminary report of this was relayed via text pager to Dr. Roland Rack on 02/26/2016 at 1407 hours. 2. Mild for age mostly calcified atherosclerosis in the head and neck. No hemodynamically significant arterial stenosis identified. 3. Thyromegaly  compatible with benign goiter. Electronically Signed   By: Genevie Ann M.D.   On: 02/26/2016 14:17   Dg Chest Portable 1 View  Result Date: 02/26/2016 CLINICAL DATA:  Hypoxia. EXAM: PORTABLE CHEST 1 VIEW COMPARISON:  One-view chest x-ray 11/07/2014. FINDINGS: The heart is enlarged. Atherosclerotic calcifications are present at the aortic arch. There is no edema or effusion to suggest failure. No focal airspace disease is present. Emphysematous changes are again noted. A remote left humerus fracture is noted with probable pseudoarticulation. Advanced degenerative changes are noted at the right shoulder. The visualized soft tissues and bony thorax are otherwise unremarkable. IMPRESSION: 1. No acute cardiopulmonary disease. 2. Cardiomegaly without failure. 3. Aortic atherosclerosis. 4. Emphysema. 5. Remote left humerus fracture with probable pseudoarticulation. Electronically Signed   By: San Morelle M.D.   On: 02/26/2016 14:39   Ct Head Code Stroke W/o Cm  Result Date: 02/26/2016 CLINICAL DATA:  Code stroke. 82 year old female  with aphasia. Last seen normal 1245 hours. Initial encounter. EXAM: CT HEAD WITHOUT CONTRAST TECHNIQUE: Contiguous axial images were obtained from the base of the skull through the vertex without intravenous contrast. COMPARISON:  Head CT without contrast 01/23/2016 and earlier. FINDINGS: Brain: Stable cerebral volume. No midline shift, mass effect, or evidence of intracranial mass lesion. Stable ventricle size and configuration. Confluent cerebral white matter hypodensity is stable. Small areas of cortical encephalomalacia along the lateral left temporal lobe and lateral left occipital lobe appear stable. More pronounced encephalomalacia along the anterior right temporal lobe appears stable. No acute intracranial hemorrhage identified. No cortically based acute infarct identified. Vascular: No suspicious intracranial vascular hyperdensity. Skull: No acute osseous abnormality  identified. Sinuses/Orbits: Visualized paranasal sinuses and mastoids are stable and well pneumatized. Other: No acute orbit or scalp soft tissue finding. ASPECTS [facility] Stroke Program Early CT Score) - Ganglionic level infarction (caudate, lentiform nuclei, internal capsule, insula, M1-M3 cortex): 7 - Supraganglionic infarction (M4-M6 cortex): 3 Total score (0-10 with 10 being normal): 10 IMPRESSION: 1. No acute cortically based infarct or acute intracranial hemorrhage. 2. ASPECTS is 10. 3. Stable encephalomalacia along the anterior right temporal lobe and lateral left temporal and occipital lobes. 4. The above was relayed via text pager to Dr. Roland Rack on 02/26/2016 at 13:52 . Electronically Signed   By: Genevie Ann M.D.   On: 02/26/2016 13:52    Procedures Procedures (including critical care time)  Medications Ordered in ED Medications - No data to display   Initial Impression / Assessment and Plan / ED Course  I have reviewed the triage vital signs and the nursing notes.  Pertinent labs & imaging results that were available during my care of the patient were reviewed by me and considered in my medical decision making (see chart for details).  Clinical Course     No specific findings on workup so far. Daughter is at the bedside and feels like her speech is similar to baseline. Patient seems to be improving but the daughter feels like she still "off". Will be admitted for seizure versus syncope workup. Dr. Leonel Ramsay feels like she still needs an MRI and EEG but if MRI is negative. Workup does not need to be obtained. Admit to the hospitalist.  Final Clinical Impressions(s) / ED Diagnoses   Final diagnoses:  Altered mental status, unspecified altered mental status type    New Prescriptions New Prescriptions   No medications on file     Sherwood Gambler, MD 02/26/16 1617

## 2016-02-26 NOTE — Code Documentation (Signed)
82 y.o. Female arrives from [facility] via [facility] as a Code Stroke. Staff noted the patient to be limp all over, not following commands and vomited once. Upon arrival to the ED, the patient was aphasic and following intermittent commands. Labs drawn, Pt taken to CT for scan. CTA performed. NIHSS 5. See EMR for NIHSS and code stroke times. Patient appears to have a right sided hemianopia, aphasia, and a RLE drift. The pt's daughter is at the bedside and reports that she has some word salad at baseline, alezthimers and denies any vision loss prior to today. Throughout the pts course in the ED, the pt has had slight improvement in symptoms. She is following commands more consistently and aphasia seems to be improving. tPA not given d/t mild and improving symptoms. Bedside handoff with ED RN Whitney.

## 2016-02-27 ENCOUNTER — Observation Stay (HOSPITAL_BASED_OUTPATIENT_CLINIC_OR_DEPARTMENT_OTHER)
Admit: 2016-02-27 | Discharge: 2016-02-27 | Disposition: A | Discharge status: Home / Self Care | Payer: Medicare Other | Attending: Internal Medicine | Admitting: Internal Medicine

## 2016-02-27 ENCOUNTER — Observation Stay (HOSPITAL_COMMUNITY): Payer: Medicare Other

## 2016-02-27 DIAGNOSIS — R4701 Aphasia: Secondary | ICD-10-CM | POA: Diagnosis not present

## 2016-02-27 DIAGNOSIS — F323 Major depressive disorder, single episode, severe with psychotic features: Secondary | ICD-10-CM | POA: Diagnosis not present

## 2016-02-27 DIAGNOSIS — R400 Somnolence: Secondary | ICD-10-CM

## 2016-02-27 DIAGNOSIS — F320 Major depressive disorder, single episode, mild: Secondary | ICD-10-CM | POA: Diagnosis not present

## 2016-02-27 DIAGNOSIS — R4182 Altered mental status, unspecified: Secondary | ICD-10-CM | POA: Diagnosis not present

## 2016-02-27 DIAGNOSIS — I10 Essential (primary) hypertension: Secondary | ICD-10-CM | POA: Diagnosis not present

## 2016-02-27 LAB — COMPREHENSIVE METABOLIC PANEL
ALT: 15 U/L (ref 14–54)
AST: 23 U/L (ref 15–41)
Albumin: 3.3 g/dL — ABNORMAL LOW (ref 3.5–5.0)
Alkaline Phosphatase: 51 U/L (ref 38–126)
Anion gap: 10 (ref 5–15)
BUN: 19 mg/dL (ref 6–20)
CHLORIDE: 101 mmol/L (ref 101–111)
CO2: 29 mmol/L (ref 22–32)
Calcium: 8.7 mg/dL — ABNORMAL LOW (ref 8.9–10.3)
Creatinine, Ser: 0.99 mg/dL (ref 0.44–1.00)
GFR, EST AFRICAN AMERICAN: 56 mL/min — AB (ref >60)
GFR, EST NON AFRICAN AMERICAN: 48 mL/min — AB (ref >60)
Glucose, Bld: 109 mg/dL — ABNORMAL HIGH (ref 65–99)
POTASSIUM: 3.7 mmol/L (ref 3.5–5.1)
Sodium: 140 mmol/L (ref 135–145)
Total Bilirubin: 0.6 mg/dL (ref 0.3–1.2)
Total Protein: 6.6 g/dL (ref 6.5–8.1)

## 2016-02-27 LAB — CBC
HEMATOCRIT: 40.1 % (ref 36.0–46.0)
Hemoglobin: 12.6 g/dL (ref 12.0–15.0)
MCH: 28.8 pg (ref 26.0–34.0)
MCHC: 31.4 g/dL (ref 30.0–36.0)
MCV: 91.8 fL (ref 78.0–100.0)
PLATELETS: 214 10*3/uL (ref 150–400)
RBC: 4.37 MIL/uL (ref 3.87–5.11)
RDW: 15.4 % (ref 11.5–15.5)
WBC: 8.4 10*3/uL (ref 4.0–10.5)

## 2016-02-27 LAB — VALPROIC ACID LEVEL: Valproic Acid Lvl: 12 ug/mL — ABNORMAL LOW (ref 50.0–100.0)

## 2016-02-27 MED ORDER — STARCH (THICKENING) PO POWD
ORAL | Status: DC | PRN
  Filled 2016-02-27: qty 227

## 2016-02-27 MED ORDER — SODIUM CHLORIDE 0.9 % IV SOLN
1000.0000 mg | Freq: Once | INTRAVENOUS | Status: AC
  Administered 2016-02-27: 1000 mg via INTRAVENOUS
  Filled 2016-02-27: qty 10

## 2016-02-27 NOTE — Evaluation (Signed)
Occupational Therapy Evaluation Patient Details Name: Tracy Reeves MRN: [id number] DOB: [birth year] Today's Date: 02/27/2016    History of Present Illness  82 y.o. female a with advanced dementia, and speech difficulties at baseline, presenting with acute on chronic altered mental status. MRI on 12/7 negative for acute infarct.    Clinical Impression   Per RN; staff at [facility] report pt at w/c level and dependent with ADL PTA. Currently pt total assist for ADL at bed level but able to perform sit to stand from EOB and few side steps to [facility] with min assist +2 and cues for sequencing. Pt intermittently following one step commands this session. Recommending return to [facility] upon d/c. Pt would benefit from continued skilled OT to address established goals.    Follow Up Recommendations  Supervision/Assistance - 24 hour (Return to [facility])    Equipment Recommendations  None recommended by OT    Recommendations for Other Services PT consult     Precautions / Restrictions Precautions Precautions: Fall Restrictions Weight Bearing Restrictions: No      Mobility Bed Mobility Overal bed mobility: Needs Assistance Bed Mobility: Rolling;Supine to Sit;Sit to Supine Rolling: Max assist   Supine to sit: Max assist Sit to supine: Max assist   General bed mobility comments: Assist for LEs. Pt initiating pulling up into sitting position but required max assist for full trunk elevation.  Transfers Overall transfer level: Needs assistance Equipment used: Rolling walker (2 wheeled) Transfers: Sit to/from Stand Sit to Stand: Min assist;+2 physical assistance         General transfer comment: Min assist for sit to stand from EOB x1. Pt able to take 5 small side steps toward [facility] with cues for sequencing.    Balance Overall balance assessment: Needs assistance Sitting-balance support: Feet supported;Bilateral upper extremity  supported Sitting balance-Leahy Scale: Fair Sitting balance - Comments: Able to maintain sitting balance without physical assist.   Standing balance support: Bilateral upper extremity supported Standing balance-Leahy Scale: Poor Standing balance comment: Bil UE supported by RW in standing.                            ADL Overall ADL's : Needs assistance/impaired                                       General ADL Comments: Pt currenlty total assist for ADL. Pt found incontinent of bowel and bladder upon arrival; max assist for rolling and total assist for peri care at bed level.     Vision Additional Comments: Difficult to assess due to impaired cognition. Does track in all quads.   Perception     Praxis      Pertinent Vitals/Pain Pain Assessment: Faces Faces Pain Scale: No hurt     Hand Dominance     Extremity/Trunk Assessment Upper Extremity Assessment Upper Extremity Assessment: RUE deficits/detail;LUE deficits/detail;Difficult to assess due to impaired cognition RUE Deficits / Details: Increased tone noted throughout, R<L. LUE Deficits / Details: Increased tone noted throughout, R<L.   Lower Extremity Assessment Lower Extremity Assessment: Defer to PT evaluation   Cervical / Trunk Assessment Cervical / Trunk Assessment: Kyphotic   Communication Communication Communication: Expressive difficulties   Cognition Arousal/Alertness: Awake/alert Behavior During Therapy: Flat affect Overall Cognitive Status: No family/caregiver present to determine baseline cognitive functioning  General Comments: Known hx of advanced dementia; no caregiver present to confirm baseline. Pt inconsistently following one step commands.   General Comments       Exercises       Shoulder Instructions      Home Living Family/patient expects to be discharged to:: Skilled nursing facility                                  Additional Comments: Pt from [facility].      Prior Functioning/Environment Level of Independence: Needs assistance  Gait / Transfers Assistance Needed: Per RN; staff from [facility] reports pt was w/c bound PTA. ADL's / Homemaking Assistance Needed: Dependent with ADL PTA.            OT Problem List: Decreased strength;Decreased range of motion;Decreased activity tolerance;Impaired balance (sitting and/or standing);Decreased cognition;Decreased knowledge of use of DME or AE;Decreased knowledge of precautions;Impaired UE functional use   OT Treatment/Interventions: Self-care/ADL training;Therapeutic exercise;DME and/or AE instruction;Therapeutic activities;Cognitive remediation/compensation;Patient/family education;Balance training    OT Goals(Current goals can be found in the care plan section) Acute Rehab OT Goals Patient Stated Goal: pt unable to state OT Goal Formulation: Patient unable to participate in goal setting Time For Goal Achievement: 03/12/16 Potential to Achieve Goals: Fair ADL Goals Pt Will Perform Grooming: with supervision;sitting Pt Will Transfer to Toilet: with min assist;stand pivot transfer;bedside commode Additional ADL Goal #1: Pt will follow one step command 50% of the time in a minimally distracting environment.  OT Frequency: Min 2X/week   Barriers to D/C:            Co-evaluation              End of Session Equipment Utilized During Treatment: Surveyor, mining Communication: Mobility status  Activity Tolerance: Patient tolerated treatment well Patient left: in bed;with call bell/phone within reach;with bed alarm set   Time: [id number] OT Time Calculation (min): 17 min Charges:  OT General Charges $OT Visit: 1 Procedure OT Evaluation $OT Eval Moderate Complexity: 1 Procedure G-Codes: OT G-codes **NOT FOR INPATIENT CLASS** Functional Assessment Tool Used: Clinical judgement Functional Limitation: Self  care Self Care Current Status [id number]): At least 80 percent but less than 100 percent impaired, limited or restricted Self Care Goal Status [id number]): At least 60 percent but less than 80 percent impaired, limited or restricted   Binnie Kand M.S., OTR/L Pager: [phone]  02/27/2016, 3:31 PM

## 2016-02-27 NOTE — Care Management Note (Signed)
Case Management Note  Patient Details  Name: Susi Brande MRN: [id number] Date of Birth: 16-Sep-1922  Subjective/Objective:            Patient presented with new onset confusion.  Resides at [facility].  CM will follow for discharge needs pending PT/OT evals and physician orders.         Action/Plan:   Expected Discharge Date:                  Expected Discharge Plan:     In-House Referral:     Discharge planning Services     Post Acute Care Choice:    Choice offered to:     DME Arranged:    DME Agency:     HH Arranged:    HH Agency:     Status of Service:     If discussed at [employer] of Stay Meetings, dates discussed:    Additional Comments:  Rolm Baptise, RN 02/27/2016, 12:11 PM

## 2016-02-27 NOTE — Progress Notes (Signed)
[facility] PROGRESS NOTE  Tracy Reeves [id number] DOB: 06/21/1922 DOA: 02/26/2016   PCP: [name], MD     Assessment/Plan: Active Problems:   Hypertension   Hypothyroid   Depression   Hypothyroidism due to acquired atrophy of thyroid   Essential hypertension   Altered mental status   Aphasia   82 y.o. female a with advanced dementia, and speech difficulties at baseline, presenting with acute on chronic altered mental status.She apparently had emesis after saying that she did not feel well at lunchtime and following this became essentially unresponsive. Now admitted for stroke workup  Assessment and plan Altered mental status/near syncope Etiology unclear. Concern for seizures versus CVA vs sepsis vs UTI.  EkG NAD  No hypoxemia   CT of the head without acute abnormality. MRI is pending . Afebrile WBC 9.7  Neuro unconvinced this is stroke related  EKG within normal limits Ammonia levels 25 UA negative Will await EEG and MRI. Echocardiogram SLP  Dysphagia 1 (Puree);Nectar-thick liquid  Repeat chest x-ray due to low-grade fever this morning and rule out aspiration  Hypertension   Controlled Continue home anti-hypertensive medications when able to swallow   Hypothyroidism: -Continue home Synthroid  Atrial Fibrillation    Rate controlled -Continue meds  Histroy of Seizures on Depakote, - Depakote level 12 - Seizure precautions  Depression Continue home ZOloft    DVT prophylaxsis Lovenox  Code Status:  DNR     Family Communication: Discussed in detail with the patient, all imaging results, lab results explained to the patient   Disposition Plan:  In am      Consultants:   Neurology    Procedures:  None  Antibiotics: Anti-infectives    None         HPI/Subjective: Patient remains encephalopathic today. She is alert but does not follow commands  Objective: Vitals:   02/27/16 0500 02/27/16 0556 02/27/16  0800 02/27/16 0941  BP:  (!) 149/90 (!) 141/59 (!) 142/51  Pulse:  77 68 69  Resp:  18 16 17   Temp:  98.2 F (36.8 C)  99.9 F (37.7 C)  TempSrc:  Oral  Axillary  SpO2:  94% 97% 95%  Weight: 50 kg (110 lb 3.7 oz)     Height:        Intake/Output Summary (Last 24 hours) at 02/27/16 1023 Last data filed at 02/26/16 2000  Gross per 24 hour  Intake                0 ml  Output              300 ml  Net             -300 ml    Exam:  Examination:  General exam: Appears calm and comfortable  Respiratory system: Clear to auscultation. Respiratory effort normal. Cardiovascular system: S1 & S2 heard, RRR. No JVD, murmurs, rubs, gallops or clicks. No pedal edema. Gastrointestinal system: Abdomen is nondistended, soft and nontender. No organomegaly or masses felt. Normal bowel sounds heard. Central nervous system: Alert and oriented. No focal neurological deficits. Extremities: Symmetric 5 x 5 power. Skin: No rashes, lesions or ulcers Psychiatry: Judgement and insight appear normal. Mood & affect appropriate.     Data Reviewed: I have personally reviewed following labs and imaging studies  Micro Results No results found for this or any previous visit (from the past 240 hour(s)).  Radiology Reports Ct Angio Head W Or Wo  Contrast  Result Date: 02/26/2016 CLINICAL DATA:  82 year old female code stroke. Language deficit. Initial encounter. EXAM: CT ANGIOGRAPHY HEAD AND NECK TECHNIQUE: Multidetector CT imaging of the head and neck was performed using the standard protocol during bolus administration of intravenous contrast. Multiplanar CT image reconstructions and MIPs were obtained to evaluate the vascular anatomy. Carotid stenosis measurements (when applicable) are obtained utilizing NASCET criteria, using the distal internal carotid diameter as the denominator. CONTRAST:  50 mL Isovue 370. COMPARISON:  Head CT without contrast 1343 hours today. FINDINGS: CTA NECK Skeleton: Degenerative  changes in the spine. Up to severe cervical facet arthropathy greater on the right. Exaggerated upper thoracic kyphosis. No acute osseous abnormality identified. Visualized paranasal sinuses and mastoids are stable and well pneumatized. Upper chest: Negative lung apices. Negative visualized superior mediastinum. Other neck: Thyromegaly. Subcentimeter bilateral thyroid nodules. No significant mass effect on the airway. Negative larynx, pharynx, parapharyngeal spaces, retropharyngeal space, sublingual space, submandibular glands and parotid glands. No cervical lymphadenopathy. No acute orbit or scalp soft tissue findings. Aortic arch: 3 vessel arch configuration, the entire arch is not included. No great vessel origin stenosis suspected. Right carotid system: Negative right cervical carotids aside from tortuosity and mild soft and calcified plaque at the right carotid bifurcation. Left carotid system: Tortuous left CCA without stenosis proximal to the bifurcation. Mild to moderate soft and calcified plaque at the left carotid bifurcation. Stenosis is less than 50 % with respect to the distal vessel. Tortuous left ICA distal to the bulb. No stenosis. Vertebral arteries:New line no proximal right subclavian or right vertebral artery stenosis. Tortuous but otherwise negative cervical right vertebral artery. No proximal left subclavian artery stenosis despite soft plaque. Normal left vertebral artery origin. Tortuous left V1 segment. No left vertebral artery stenosis in the neck. CTA HEAD Posterior circulation: Patent distal vertebral arteries without stenosis. Mild left V4 segment calcified plaque. Patent PICA origins. No basilar stenosis. Normal SCA and left PCA origins. Fetal type right PCA origin. Left posterior communicating artery diminutive or absent. Bilateral PCA branches are within normal limits. Anterior circulation: Both ICA siphons are patent. Mild to moderate calcified siphon atherosclerosis. Only mild  subsequent supraclinoid segment stenosis bilaterally. Ophthalmic and right posterior communicating artery origins are normal. Patent carotid termini. Normal MCA and ACA origins aside from mild calcified plaque. Diminutive or absent anterior communicating artery. Bilateral ACA branches are within normal limits. Right MCA branches are within normal limits. Left MCA M1 segment, left MCA bifurcation, and left MCA branches are within normal limits. Venous sinuses: Not well evaluated due to early contrast timing on these images. Anatomic variants: Fetal type right PCA origin. Review of the MIP images confirms the above findings IMPRESSION: 1. Negative for emergent large vessel occlusion. Preliminary report of this was relayed via text pager to Dr. Roland Rack on 02/26/2016 at 1407 hours. 2. Mild for age mostly calcified atherosclerosis in the head and neck. No hemodynamically significant arterial stenosis identified. 3. Thyromegaly compatible with benign goiter. Electronically Signed   By: Genevie Ann M.D.   On: 02/26/2016 14:17   Ct Angio Neck W Or Wo Contrast  Result Date: 02/26/2016 CLINICAL DATA:  82 year old female code stroke. Language deficit. Initial encounter. EXAM: CT ANGIOGRAPHY HEAD AND NECK TECHNIQUE: Multidetector CT imaging of the head and neck was performed using the standard protocol during bolus administration of intravenous contrast. Multiplanar CT image reconstructions and MIPs were obtained to evaluate the vascular anatomy. Carotid stenosis measurements (when applicable) are obtained utilizing NASCET criteria, using  the distal internal carotid diameter as the denominator. CONTRAST:  50 mL Isovue 370. COMPARISON:  Head CT without contrast 1343 hours today. FINDINGS: CTA NECK Skeleton: Degenerative changes in the spine. Up to severe cervical facet arthropathy greater on the right. Exaggerated upper thoracic kyphosis. No acute osseous abnormality identified. Visualized paranasal sinuses and  mastoids are stable and well pneumatized. Upper chest: Negative lung apices. Negative visualized superior mediastinum. Other neck: Thyromegaly. Subcentimeter bilateral thyroid nodules. No significant mass effect on the airway. Negative larynx, pharynx, parapharyngeal spaces, retropharyngeal space, sublingual space, submandibular glands and parotid glands. No cervical lymphadenopathy. No acute orbit or scalp soft tissue findings. Aortic arch: 3 vessel arch configuration, the entire arch is not included. No great vessel origin stenosis suspected. Right carotid system: Negative right cervical carotids aside from tortuosity and mild soft and calcified plaque at the right carotid bifurcation. Left carotid system: Tortuous left CCA without stenosis proximal to the bifurcation. Mild to moderate soft and calcified plaque at the left carotid bifurcation. Stenosis is less than 50 % with respect to the distal vessel. Tortuous left ICA distal to the bulb. No stenosis. Vertebral arteries:New line no proximal right subclavian or right vertebral artery stenosis. Tortuous but otherwise negative cervical right vertebral artery. No proximal left subclavian artery stenosis despite soft plaque. Normal left vertebral artery origin. Tortuous left V1 segment. No left vertebral artery stenosis in the neck. CTA HEAD Posterior circulation: Patent distal vertebral arteries without stenosis. Mild left V4 segment calcified plaque. Patent PICA origins. No basilar stenosis. Normal SCA and left PCA origins. Fetal type right PCA origin. Left posterior communicating artery diminutive or absent. Bilateral PCA branches are within normal limits. Anterior circulation: Both ICA siphons are patent. Mild to moderate calcified siphon atherosclerosis. Only mild subsequent supraclinoid segment stenosis bilaterally. Ophthalmic and right posterior communicating artery origins are normal. Patent carotid termini. Normal MCA and ACA origins aside from mild  calcified plaque. Diminutive or absent anterior communicating artery. Bilateral ACA branches are within normal limits. Right MCA branches are within normal limits. Left MCA M1 segment, left MCA bifurcation, and left MCA branches are within normal limits. Venous sinuses: Not well evaluated due to early contrast timing on these images. Anatomic variants: Fetal type right PCA origin. Review of the MIP images confirms the above findings IMPRESSION: 1. Negative for emergent large vessel occlusion. Preliminary report of this was relayed via text pager to Dr. Roland Rack on 02/26/2016 at 1407 hours. 2. Mild for age mostly calcified atherosclerosis in the head and neck. No hemodynamically significant arterial stenosis identified. 3. Thyromegaly compatible with benign goiter. Electronically Signed   By: Genevie Ann M.D.   On: 02/26/2016 14:17   Dg Chest Portable 1 View  Result Date: 02/26/2016 CLINICAL DATA:  Hypoxia. EXAM: PORTABLE CHEST 1 VIEW COMPARISON:  One-view chest x-ray 11/07/2014. FINDINGS: The heart is enlarged. Atherosclerotic calcifications are present at the aortic arch. There is no edema or effusion to suggest failure. No focal airspace disease is present. Emphysematous changes are again noted. A remote left humerus fracture is noted with probable pseudoarticulation. Advanced degenerative changes are noted at the right shoulder. The visualized soft tissues and bony thorax are otherwise unremarkable. IMPRESSION: 1. No acute cardiopulmonary disease. 2. Cardiomegaly without failure. 3. Aortic atherosclerosis. 4. Emphysema. 5. Remote left humerus fracture with probable pseudoarticulation. Electronically Signed   By: San Morelle M.D.   On: 02/26/2016 14:39   Ct Head Code Stroke W/o Cm  Result Date: 02/26/2016 CLINICAL DATA:  Code stroke.  82 year old female with aphasia. Last seen normal 1245 hours. Initial encounter. EXAM: CT HEAD WITHOUT CONTRAST TECHNIQUE: Contiguous axial images were obtained  from the base of the skull through the vertex without intravenous contrast. COMPARISON:  Head CT without contrast 01/23/2016 and earlier. FINDINGS: Brain: Stable cerebral volume. No midline shift, mass effect, or evidence of intracranial mass lesion. Stable ventricle size and configuration. Confluent cerebral white matter hypodensity is stable. Small areas of cortical encephalomalacia along the lateral left temporal lobe and lateral left occipital lobe appear stable. More pronounced encephalomalacia along the anterior right temporal lobe appears stable. No acute intracranial hemorrhage identified. No cortically based acute infarct identified. Vascular: No suspicious intracranial vascular hyperdensity. Skull: No acute osseous abnormality identified. Sinuses/Orbits: Visualized paranasal sinuses and mastoids are stable and well pneumatized. Other: No acute orbit or scalp soft tissue finding. ASPECTS [facility] Stroke Program Early CT Score) - Ganglionic level infarction (caudate, lentiform nuclei, internal capsule, insula, M1-M3 cortex): 7 - Supraganglionic infarction (M4-M6 cortex): 3 Total score (0-10 with 10 being normal): 10 IMPRESSION: 1. No acute cortically based infarct or acute intracranial hemorrhage. 2. ASPECTS is 10. 3. Stable encephalomalacia along the anterior right temporal lobe and lateral left temporal and occipital lobes. 4. The above was relayed via text pager to Dr. Roland Rack on 02/26/2016 at 13:52 . Electronically Signed   By: Genevie Ann M.D.   On: 02/26/2016 13:52     CBC  Recent Labs Lab 02/26/16 1345 02/26/16 1351 02/27/16 0012  WBC 9.7  --  8.4  HGB 13.5 14.6 12.6  HCT 42.9 43.0 40.1  PLT 234  --  214  MCV 92.7  --  91.8  MCH 29.2  --  28.8  MCHC 31.5  --  31.4  RDW 15.4  --  15.4  LYMPHSABS 2.2  --   --   MONOABS 1.0  --   --   EOSABS 0.0  --   --   BASOSABS 0.0  --   --     Chemistries   Recent Labs Lab 02/26/16 1345 02/26/16 1351 02/27/16 0012  NA 140 140  140  K 4.6 4.5 3.7  CL 101 100* 101  CO2 32  --  29  GLUCOSE 154* 150* 109*  BUN 21* 27* 19  CREATININE 1.30* 1.20* 0.99  CALCIUM 8.9  --  8.7*  AST 26  --  23  ALT 16  --  15  ALKPHOS 60  --  51  BILITOT 0.8  --  0.6   ------------------------------------------------------------------------------------------------------------------ estimated creatinine clearance is 26.6 mL/min (by C-G formula based on SCr of 0.99 mg/dL). ------------------------------------------------------------------------------------------------------------------ No results for input(s): HGBA1C in the last 72 hours. ------------------------------------------------------------------------------------------------------------------ No results for input(s): CHOL, HDL, LDLCALC, TRIG, CHOLHDL, LDLDIRECT in the last 72 hours. ------------------------------------------------------------------------------------------------------------------ No results for input(s): TSH, T4TOTAL, T3FREE, THYROIDAB in the last 72 hours.  Invalid input(s): FREET3 ------------------------------------------------------------------------------------------------------------------ No results for input(s): VITAMINB12, FOLATE, FERRITIN, TIBC, IRON, RETICCTPCT in the last 72 hours.  Coagulation profile  Recent Labs Lab 02/26/16 1345  INR 1.06    No results for input(s): DDIMER in the last 72 hours.  Cardiac Enzymes No results for input(s): CKMB, TROPONINI, MYOGLOBIN in the last 168 hours.  Invalid input(s): CK ------------------------------------------------------------------------------------------------------------------ Invalid input(s): POCBNP   CBG:  Recent Labs Lab 02/26/16 1340  GLUCAP 108*       Studies: Ct Angio Head W Or Wo Contrast  Result Date: 02/26/2016 CLINICAL DATA:  82 year old female code stroke. Language deficit. Initial encounter. EXAM:  CT ANGIOGRAPHY HEAD AND NECK TECHNIQUE: Multidetector CT imaging of  the head and neck was performed using the standard protocol during bolus administration of intravenous contrast. Multiplanar CT image reconstructions and MIPs were obtained to evaluate the vascular anatomy. Carotid stenosis measurements (when applicable) are obtained utilizing NASCET criteria, using the distal internal carotid diameter as the denominator. CONTRAST:  50 mL Isovue 370. COMPARISON:  Head CT without contrast 1343 hours today. FINDINGS: CTA NECK Skeleton: Degenerative changes in the spine. Up to severe cervical facet arthropathy greater on the right. Exaggerated upper thoracic kyphosis. No acute osseous abnormality identified. Visualized paranasal sinuses and mastoids are stable and well pneumatized. Upper chest: Negative lung apices. Negative visualized superior mediastinum. Other neck: Thyromegaly. Subcentimeter bilateral thyroid nodules. No significant mass effect on the airway. Negative larynx, pharynx, parapharyngeal spaces, retropharyngeal space, sublingual space, submandibular glands and parotid glands. No cervical lymphadenopathy. No acute orbit or scalp soft tissue findings. Aortic arch: 3 vessel arch configuration, the entire arch is not included. No great vessel origin stenosis suspected. Right carotid system: Negative right cervical carotids aside from tortuosity and mild soft and calcified plaque at the right carotid bifurcation. Left carotid system: Tortuous left CCA without stenosis proximal to the bifurcation. Mild to moderate soft and calcified plaque at the left carotid bifurcation. Stenosis is less than 50 % with respect to the distal vessel. Tortuous left ICA distal to the bulb. No stenosis. Vertebral arteries:New line no proximal right subclavian or right vertebral artery stenosis. Tortuous but otherwise negative cervical right vertebral artery. No proximal left subclavian artery stenosis despite soft plaque. Normal left vertebral artery origin. Tortuous left V1 segment. No left  vertebral artery stenosis in the neck. CTA HEAD Posterior circulation: Patent distal vertebral arteries without stenosis. Mild left V4 segment calcified plaque. Patent PICA origins. No basilar stenosis. Normal SCA and left PCA origins. Fetal type right PCA origin. Left posterior communicating artery diminutive or absent. Bilateral PCA branches are within normal limits. Anterior circulation: Both ICA siphons are patent. Mild to moderate calcified siphon atherosclerosis. Only mild subsequent supraclinoid segment stenosis bilaterally. Ophthalmic and right posterior communicating artery origins are normal. Patent carotid termini. Normal MCA and ACA origins aside from mild calcified plaque. Diminutive or absent anterior communicating artery. Bilateral ACA branches are within normal limits. Right MCA branches are within normal limits. Left MCA M1 segment, left MCA bifurcation, and left MCA branches are within normal limits. Venous sinuses: Not well evaluated due to early contrast timing on these images. Anatomic variants: Fetal type right PCA origin. Review of the MIP images confirms the above findings IMPRESSION: 1. Negative for emergent large vessel occlusion. Preliminary report of this was relayed via text pager to Dr. Roland Rack on 02/26/2016 at 1407 hours. 2. Mild for age mostly calcified atherosclerosis in the head and neck. No hemodynamically significant arterial stenosis identified. 3. Thyromegaly compatible with benign goiter. Electronically Signed   By: Genevie Ann M.D.   On: 02/26/2016 14:17   Ct Angio Neck W Or Wo Contrast  Result Date: 02/26/2016 CLINICAL DATA:  82 year old female code stroke. Language deficit. Initial encounter. EXAM: CT ANGIOGRAPHY HEAD AND NECK TECHNIQUE: Multidetector CT imaging of the head and neck was performed using the standard protocol during bolus administration of intravenous contrast. Multiplanar CT image reconstructions and MIPs were obtained to evaluate the vascular  anatomy. Carotid stenosis measurements (when applicable) are obtained utilizing NASCET criteria, using the distal internal carotid diameter as the denominator. CONTRAST:  50 mL Isovue 370. COMPARISON:  Head CT without contrast 1343 hours today. FINDINGS: CTA NECK Skeleton: Degenerative changes in the spine. Up to severe cervical facet arthropathy greater on the right. Exaggerated upper thoracic kyphosis. No acute osseous abnormality identified. Visualized paranasal sinuses and mastoids are stable and well pneumatized. Upper chest: Negative lung apices. Negative visualized superior mediastinum. Other neck: Thyromegaly. Subcentimeter bilateral thyroid nodules. No significant mass effect on the airway. Negative larynx, pharynx, parapharyngeal spaces, retropharyngeal space, sublingual space, submandibular glands and parotid glands. No cervical lymphadenopathy. No acute orbit or scalp soft tissue findings. Aortic arch: 3 vessel arch configuration, the entire arch is not included. No great vessel origin stenosis suspected. Right carotid system: Negative right cervical carotids aside from tortuosity and mild soft and calcified plaque at the right carotid bifurcation. Left carotid system: Tortuous left CCA without stenosis proximal to the bifurcation. Mild to moderate soft and calcified plaque at the left carotid bifurcation. Stenosis is less than 50 % with respect to the distal vessel. Tortuous left ICA distal to the bulb. No stenosis. Vertebral arteries:New line no proximal right subclavian or right vertebral artery stenosis. Tortuous but otherwise negative cervical right vertebral artery. No proximal left subclavian artery stenosis despite soft plaque. Normal left vertebral artery origin. Tortuous left V1 segment. No left vertebral artery stenosis in the neck. CTA HEAD Posterior circulation: Patent distal vertebral arteries without stenosis. Mild left V4 segment calcified plaque. Patent PICA origins. No basilar stenosis.  Normal SCA and left PCA origins. Fetal type right PCA origin. Left posterior communicating artery diminutive or absent. Bilateral PCA branches are within normal limits. Anterior circulation: Both ICA siphons are patent. Mild to moderate calcified siphon atherosclerosis. Only mild subsequent supraclinoid segment stenosis bilaterally. Ophthalmic and right posterior communicating artery origins are normal. Patent carotid termini. Normal MCA and ACA origins aside from mild calcified plaque. Diminutive or absent anterior communicating artery. Bilateral ACA branches are within normal limits. Right MCA branches are within normal limits. Left MCA M1 segment, left MCA bifurcation, and left MCA branches are within normal limits. Venous sinuses: Not well evaluated due to early contrast timing on these images. Anatomic variants: Fetal type right PCA origin. Review of the MIP images confirms the above findings IMPRESSION: 1. Negative for emergent large vessel occlusion. Preliminary report of this was relayed via text pager to Dr. Roland Rack on 02/26/2016 at 1407 hours. 2. Mild for age mostly calcified atherosclerosis in the head and neck. No hemodynamically significant arterial stenosis identified. 3. Thyromegaly compatible with benign goiter. Electronically Signed   By: Genevie Ann M.D.   On: 02/26/2016 14:17   Dg Chest Portable 1 View  Result Date: 02/26/2016 CLINICAL DATA:  Hypoxia. EXAM: PORTABLE CHEST 1 VIEW COMPARISON:  One-view chest x-ray 11/07/2014. FINDINGS: The heart is enlarged. Atherosclerotic calcifications are present at the aortic arch. There is no edema or effusion to suggest failure. No focal airspace disease is present. Emphysematous changes are again noted. A remote left humerus fracture is noted with probable pseudoarticulation. Advanced degenerative changes are noted at the right shoulder. The visualized soft tissues and bony thorax are otherwise unremarkable. IMPRESSION: 1. No acute cardiopulmonary  disease. 2. Cardiomegaly without failure. 3. Aortic atherosclerosis. 4. Emphysema. 5. Remote left humerus fracture with probable pseudoarticulation. Electronically Signed   By: San Morelle M.D.   On: 02/26/2016 14:39   Ct Head Code Stroke W/o Cm  Result Date: 02/26/2016 CLINICAL DATA:  Code stroke. 82 year old female with aphasia. Last seen normal 1245 hours. Initial encounter. EXAM: CT HEAD WITHOUT CONTRAST  TECHNIQUE: Contiguous axial images were obtained from the base of the skull through the vertex without intravenous contrast. COMPARISON:  Head CT without contrast 01/23/2016 and earlier. FINDINGS: Brain: Stable cerebral volume. No midline shift, mass effect, or evidence of intracranial mass lesion. Stable ventricle size and configuration. Confluent cerebral white matter hypodensity is stable. Small areas of cortical encephalomalacia along the lateral left temporal lobe and lateral left occipital lobe appear stable. More pronounced encephalomalacia along the anterior right temporal lobe appears stable. No acute intracranial hemorrhage identified. No cortically based acute infarct identified. Vascular: No suspicious intracranial vascular hyperdensity. Skull: No acute osseous abnormality identified. Sinuses/Orbits: Visualized paranasal sinuses and mastoids are stable and well pneumatized. Other: No acute orbit or scalp soft tissue finding. ASPECTS [facility] Stroke Program Early CT Score) - Ganglionic level infarction (caudate, lentiform nuclei, internal capsule, insula, M1-M3 cortex): 7 - Supraganglionic infarction (M4-M6 cortex): 3 Total score (0-10 with 10 being normal): 10 IMPRESSION: 1. No acute cortically based infarct or acute intracranial hemorrhage. 2. ASPECTS is 10. 3. Stable encephalomalacia along the anterior right temporal lobe and lateral left temporal and occipital lobes. 4. The above was relayed via text pager to Dr. Roland Rack on 02/26/2016 at 13:52 . Electronically Signed   By:  Genevie Ann M.D.   On: 02/26/2016 13:52      No results found for: HGBA1C Lab Results  Component Value Date   CREATININE 0.99 02/27/2016       Scheduled Meds: . amiodarone  100 mg Oral Daily  . divalproex  125 mg Oral Q12H  . pantoprazole  40 mg Oral Daily  . sertraline  75 mg Oral Daily  . sodium chloride flush  3 mL Intravenous Q12H   Continuous Infusions: . sodium chloride 75 mL/hr at 02/27/16 0551     LOS: 0 days    Time spent: >30 MINS    [facility]  [facility] Pager [phone]. If 7PM-7AM, please contact night-coverage at [email or web address], password [facility] 02/27/2016, 10:23 AM  LOS: 0 days

## 2016-02-27 NOTE — Procedures (Signed)
ELECTROENCEPHALOGRAM REPORT  Date of Study: 02/27/2016  Patient's Name: Dayelin Fagley MRN: [id number] Date of Birth: July 12, 1922  Referring Provider: Etta Quill, PA-C  Clinical History: This is a 82 year old woman with a change in mental status.  Medications: divalproex (DEPAKOTE SPRINKLE) capsule 125 mg  acetaminophen (TYLENOL) tablet 650 mg  amiodarone (PACERONE) tablet 100 mg  bisacodyl (DULCOLAX) suppository 10 mg  food thickener (THICK IT) powder  hydrALAZINE (APRESOLINE) injection 5-10 mg  HYDROcodone-acetaminophen (NORCO/VICODIN) 5-325 MG per tablet 1-2 tablet  LORazepam (ATIVAN) tablet 0.25 mg  pantoprazole (PROTONIX) EC tablet 40 mg  senna-docusate (Senokot-S) tablet 1 tablet  sertraline (ZOLOFT) tablet 75 mg   Technical Summary: A multichannel digital EEG recording measured by the international 10-20 system with electrodes applied with paste and impedances below 5000 ohms performed as portable with EKG monitoring in an awake and drowsy patient.  Hyperventilation and photic stimulation were not performed.  The digital EEG was referentially recorded, reformatted, and digitally filtered in a variety of bipolar and referential montages for optimal display.   Description: The patient is awake and drowsy during the recording. She does not follow commands and mumbles during the study. There is no clear posterior dominant rhythm. The background is disorganized with a moderate amount of diffuse 4-5 Hz theta and 2-3 Hz delta slowing of the background. There are occasional triphasic waves seen. There are frequent right temporal sharp waves, some with triphasic-like appearance. There are occasional independent left occipital sharp waves seen. During drowsiness and sleep, there is an increase in theta and delta slowing of the background. Normal sleep architecture is not seen. Hyperventilation and photic stimulation were not performed.  There were no electrographic seizures seen.    EKG  lead was unremarkable.  Impression: This awake and drowsy EEG is abnormal due to the presence of:  1. Moderate diffuse slowing of the background with triphasic waves seen 2. Frequent right temporal sharp waves 3. Occasional left occipital sharp waves  Clinical Correlation of the above findings indicates diffuse cerebral dysfunction that is non-specific in etiology and can be seen with hypoxic/ischemic injury, toxic/metabolic encephalopathies, neurodegenerative disorders, or medication effect. Triphasic waves are typically seen with hepatic encephalopathy but can be seen with other metabolic encephalopathies as well. There is a possible tendency for seizures to arise from the right temporal and left occipital regions. There were no electrographic seizures seen in this study. Clinical correlation is advised.   Ellouise Newer, M.D.

## 2016-02-27 NOTE — Progress Notes (Signed)
EEG completed, results pending. 

## 2016-02-27 NOTE — Evaluation (Addendum)
Clinical/Bedside Swallow Evaluation Patient Details  Name: Tracy Reeves MRN: [id number] Date of Birth: 12-18-1922  Today's Date: 02/27/2016 Time: SLP Start Time (ACUTE ONLY): 0808 SLP Stop Time (ACUTE ONLY): 0830 SLP Time Calculation (min) (ACUTE ONLY): 22 min  Past Medical History:  Past Medical History:  Diagnosis Date  . Dementia   . Fall at nursing home   . Humeral fracture 09/03/2014  . Hypertension   . Renal insufficiency   . Seizure ([place])   . Shingles   . Syncope   . TBI (traumatic brain injury) ([place])   . Thyroid activity decreased   . Thyroid disease    Past Surgical History:  Past Surgical History:  Procedure Laterality Date  . ABDOMINAL HYSTERECTOMY    . CATARACT EXTRACTION     HPI:  82 y.o.femalea with advanced dementia, and speech difficulties at baseline, TBI, frequent UTI's, presenting with acute on chronic altered mental status. Per chart decreased intake. CXR No acute cardiopulmonary disease and emphysema. CT No acute cortically based infarct or acute intracranial hemorrhage. Swallow assessment 05/2014 followed for several days, initial po recommendation Dys 3, thin upgraded to regular thin the following day.    Assessment / Plan / Recommendation Clinical Impression  82 yr old pt with dementia, easily distracted requiring mod-max cues to attend to SLP intermittently following directions. Decreased oral manipulation/mastication and mildl-moderately delayed transit. Highly suspect  intermittent penetration or aspiration of thin due to immediate cough suspected delayed initiation. Multiple and audible swallows indicative of possible pharyngeal residue and discoordination. No family present. If family/MD wishes to objectively determine presence of aspiration, MBS can be done but not recommended at present time. Family would benefit from discussion of swallow function in pt's with advanced dementia and suspected course of progression. Recommend Dys 1, nectar thick  liquids, crush pills, no straws and full supervision/assist.      Aspiration Risk  Severe aspiration risk    Diet Recommendation Dysphagia 1 (Puree);Nectar-thick liquid   Liquid Administration via: Cup;No straw Medication Administration: Crushed with puree Supervision: Staff to assist with self feeding;Full supervision/cueing for compensatory strategies Compensations: Slow rate;Minimize environmental distractions;Small sips/bites;Lingual sweep for clearance of pocketing Postural Changes: Seated upright at 90 degrees    Other  Recommendations Oral Care Recommendations: Oral care BID Other Recommendations: Order thickener from pharmacy   Follow up Recommendations Skilled Nursing facility      Frequency and Duration min 2x/week  2 weeks       Prognosis Prognosis for Safe Diet Advancement: Fair Barriers to Reach Goals: Cognitive deficits      Swallow Study   General HPI: 82 y.o.femalea with advanced dementia, and speech difficulties at baseline, TBI, frequent UTI's, presenting with acute on chronic altered mental status. Per chart decreased intake. CXR No acute cardiopulmonary disease and emphysema. CT No acute cortically based infarct or acute intracranial hemorrhage. Swallow assessment 05/2014 followed for several days, initial po recommendation Dys 3, thin upgraded to regular thin the following day.  Type of Study: Bedside Swallow Evaluation Previous Swallow Assessment: see HPI Diet Prior to this Study: NPO Temperature Spikes Noted: No Respiratory Status: Room air History of Recent Intubation: No Behavior/Cognition: Alert;Pleasant mood;Cooperative;Confused;Distractible;Requires cueing Oral Cavity Assessment: Dry Oral Care Completed by SLP: Yes Oral Cavity - Dentition: Adequate natural dentition Vision: Functional for self-feeding Self-Feeding Abilities: Needs assist;Able to feed self;Needs set up Patient Positioning: Upright in bed Baseline Vocal Quality: Low vocal  intensity Volitional Cough: Cognitively unable to elicit Volitional Swallow: Unable to elicit    Oral/Motor/Sensory  Function Overall Oral Motor/Sensory Function:  (no focal weakness)   Ice Chips Ice chips: Impaired Presentation: Spoon Oral Phase Impairments: Reduced lingual movement/coordination Oral Phase Functional Implications: Prolonged oral transit Pharyngeal Phase Impairments: Suspected delayed Swallow   Thin Liquid Thin Liquid: Impaired Presentation: Cup Pharyngeal  Phase Impairments: Suspected delayed Swallow;Multiple swallows;Cough - Immediate (audible swallow)    Nectar Thick Nectar Thick Liquid: Impaired Presentation: Cup Pharyngeal Phase Impairments: Suspected delayed Swallow;Multiple swallows (audible swallow)   Honey Thick Honey Thick Liquid: Not tested   Puree Puree: Impaired Oral Phase Impairments: Reduced lingual movement/coordination Pharyngeal Phase Impairments: Suspected delayed Swallow;Multiple swallows   Solid   GO   Solid: Impaired Presentation: Spoon Oral Phase Impairments: Impaired mastication;Reduced lingual movement/coordination Oral Phase Functional Implications: Prolonged oral transit;Impaired mastication Pharyngeal Phase Impairments: Suspected delayed Swallow;Multiple swallows    Functional Assessment Tool Used: skilled clinical judgement Functional Limitations: Swallowing Swallow Current Status [id number]): At least 60 percent but less than 80 percent impaired, limited or restricted Swallow Goal Status [id number]): At least 40 percent but less than 60 percent impaired, limited or restricted   Houston Siren 02/27/2016,8:54 AM Orbie Pyo Colvin Caroli[employer] [phone]

## 2016-02-27 NOTE — Care Management Obs Status (Signed)
[place] NOTIFICATION   Patient Details  Name: Tracy Reeves MRN: [id number] Date of Birth: [birth year]   Medicare Observation Status Notification Given:  Yes    Pollie Friar, RN 02/27/2016, 4:08 PM

## 2016-02-27 NOTE — Progress Notes (Signed)
OT Cancellation Note  Patient Details Name: Sabre Corman MRN: [id number] DOB: 10/17/1922   Cancelled Treatment:    Reason Eval/Treat Not Completed: Patient at procedure or test/ unavailable. Will reattempt as able.  Tyrone Schimke OTR/L Pager: [phone]   02/27/2016, 11:54 AM

## 2016-02-27 NOTE — Progress Notes (Signed)
Subjective: Patient remains encephalopathic today. She is alert but does not follow commands. She tracks me in the room. When arms are held outstretched she does show asterixis. She is moving all extremities but only to noxious stimuli. She attempts to speak but is very hypophonic and I am not able to understand what she is saying.  Exam: Vitals:   02/27/16 0800 02/27/16 0941  BP: (!) 141/59 (!) 142/51  Pulse: 68 69  Resp: 16 17  Temp:  99.9 F (37.7 C)        Gen: In bed, NAD MS: Patient is alert however she does not follow commands. She does speak however, unable to understand what she is saying she speaks in a very low tone voice. She is able to hold her arms out stretched when asked and aided. She tracks me as a walk across the room. CN: Sensation is intact, pupils equal round reactive, eyes move horizontally with no difficulty. Blinks to threat bilaterally. Tongue is midline Motor: Moving all extremities antigravity. When arms are held outstretched I do notice that she has a mild tremor along with intermittent asterixis. Sensory: Withdraws from pain bilaterally   Pertinent Labs/Diagnostics: Calcium 8.7 GFR 48 Glucose 109 Albumin 3.3  CTA of head and neck: IMPRESSION: 1. Negative for emergent large vessel occlusion. Preliminary report of this was relayed via text pager to Dr. Aurora Mask on 02/26/2016 at 1407 hours. 2. Mild for age mostly calcified atherosclerosis in the head and neck. No hemodynamically significant arterial stenosis identified. 3. Thyromegaly compatible with benign goiter.   Impression: 82 year old female with altered mental status after vomiting. Possibilities include syncope due to vagal event, hypoxia (patient was hypoxic on EMS arrival), seizure, stroke. I do not have reason to suspect stroke over the other events, and would not perform a stroke workup unless the MRI was positive.  Recommendations:  1) MRI brain 2) EEG 3) neurology will  follow  Etta Quill PA-C [facility] [phone]  02/27/2016, 10:24 AM   I have seen the patient and agree with the above. She is interactive, but her speech does not make a lot of sense., Suspect this is baseline. She has sharp waves on EEG, which  suggests that the event of yesterday was a seizure. I will start [place].   Roland Rack, MD [facility] [phone]  If 7pm- 7am, please page neurology on call as listed in [place].

## 2016-02-28 ENCOUNTER — Observation Stay (HOSPITAL_COMMUNITY): Payer: Medicare Other

## 2016-02-28 ENCOUNTER — Encounter (HOSPITAL_COMMUNITY): Payer: Self-pay | Admitting: General Practice

## 2016-02-28 DIAGNOSIS — R1310 Dysphagia, unspecified: Secondary | ICD-10-CM | POA: Diagnosis present

## 2016-02-28 DIAGNOSIS — I4891 Unspecified atrial fibrillation: Secondary | ICD-10-CM | POA: Diagnosis present

## 2016-02-28 DIAGNOSIS — R9401 Abnormal electroencephalogram [EEG]: Secondary | ICD-10-CM | POA: Diagnosis present

## 2016-02-28 DIAGNOSIS — Z66 Do not resuscitate: Secondary | ICD-10-CM | POA: Diagnosis present

## 2016-02-28 DIAGNOSIS — R4182 Altered mental status, unspecified: Secondary | ICD-10-CM | POA: Diagnosis not present

## 2016-02-28 DIAGNOSIS — E039 Hypothyroidism, unspecified: Secondary | ICD-10-CM | POA: Diagnosis present

## 2016-02-28 DIAGNOSIS — Z79899 Other long term (current) drug therapy: Secondary | ICD-10-CM | POA: Diagnosis not present

## 2016-02-28 DIAGNOSIS — R4701 Aphasia: Secondary | ICD-10-CM | POA: Diagnosis present

## 2016-02-28 DIAGNOSIS — F320 Major depressive disorder, single episode, mild: Secondary | ICD-10-CM | POA: Diagnosis not present

## 2016-02-28 DIAGNOSIS — Z9849 Cataract extraction status, unspecified eye: Secondary | ICD-10-CM | POA: Diagnosis not present

## 2016-02-28 DIAGNOSIS — J209 Acute bronchitis, unspecified: Secondary | ICD-10-CM | POA: Diagnosis present

## 2016-02-28 DIAGNOSIS — K7290 Hepatic failure, unspecified without coma: Secondary | ICD-10-CM | POA: Diagnosis present

## 2016-02-28 DIAGNOSIS — Z888 Allergy status to other drugs, medicaments and biological substances status: Secondary | ICD-10-CM | POA: Diagnosis not present

## 2016-02-28 DIAGNOSIS — I10 Essential (primary) hypertension: Secondary | ICD-10-CM | POA: Diagnosis not present

## 2016-02-28 DIAGNOSIS — R0902 Hypoxemia: Secondary | ICD-10-CM | POA: Diagnosis present

## 2016-02-28 DIAGNOSIS — Z87440 Personal history of urinary (tract) infections: Secondary | ICD-10-CM | POA: Diagnosis not present

## 2016-02-28 DIAGNOSIS — Z91012 Allergy to eggs: Secondary | ICD-10-CM | POA: Diagnosis not present

## 2016-02-28 DIAGNOSIS — R278 Other lack of coordination: Secondary | ICD-10-CM | POA: Diagnosis not present

## 2016-02-28 DIAGNOSIS — N289 Disorder of kidney and ureter, unspecified: Secondary | ICD-10-CM | POA: Diagnosis present

## 2016-02-28 DIAGNOSIS — E876 Hypokalemia: Secondary | ICD-10-CM | POA: Diagnosis present

## 2016-02-28 DIAGNOSIS — F323 Major depressive disorder, single episode, severe with psychotic features: Secondary | ICD-10-CM | POA: Diagnosis not present

## 2016-02-28 DIAGNOSIS — G40909 Epilepsy, unspecified, not intractable, without status epilepticus: Secondary | ICD-10-CM | POA: Diagnosis present

## 2016-02-28 DIAGNOSIS — F0390 Unspecified dementia without behavioral disturbance: Secondary | ICD-10-CM | POA: Diagnosis present

## 2016-02-28 DIAGNOSIS — Z90710 Acquired absence of both cervix and uterus: Secondary | ICD-10-CM | POA: Diagnosis not present

## 2016-02-28 DIAGNOSIS — Z87820 Personal history of traumatic brain injury: Secondary | ICD-10-CM | POA: Diagnosis not present

## 2016-02-28 DIAGNOSIS — G459 Transient cerebral ischemic attack, unspecified: Secondary | ICD-10-CM | POA: Diagnosis present

## 2016-02-28 DIAGNOSIS — F329 Major depressive disorder, single episode, unspecified: Secondary | ICD-10-CM | POA: Diagnosis present

## 2016-02-28 LAB — COMPREHENSIVE METABOLIC PANEL
ALT: 13 U/L — AB (ref 14–54)
AST: 18 U/L (ref 15–41)
Albumin: 2.9 g/dL — ABNORMAL LOW (ref 3.5–5.0)
Alkaline Phosphatase: 44 U/L (ref 38–126)
Anion gap: 10 (ref 5–15)
BUN: 17 mg/dL (ref 6–20)
CHLORIDE: 102 mmol/L (ref 101–111)
CO2: 27 mmol/L (ref 22–32)
Calcium: 8.4 mg/dL — ABNORMAL LOW (ref 8.9–10.3)
Creatinine, Ser: 0.85 mg/dL (ref 0.44–1.00)
GFR, EST NON AFRICAN AMERICAN: 58 mL/min — AB (ref >60)
Glucose, Bld: 81 mg/dL (ref 65–99)
POTASSIUM: 3.1 mmol/L — AB (ref 3.5–5.1)
SODIUM: 139 mmol/L (ref 135–145)
Total Bilirubin: 0.8 mg/dL (ref 0.3–1.2)
Total Protein: 5.7 g/dL — ABNORMAL LOW (ref 6.5–8.1)

## 2016-02-28 LAB — BLOOD CULTURE ID PANEL (REFLEXED)
ACINETOBACTER BAUMANNII: NOT DETECTED
CANDIDA ALBICANS: NOT DETECTED
CANDIDA KRUSEI: NOT DETECTED
CANDIDA PARAPSILOSIS: NOT DETECTED
CANDIDA TROPICALIS: NOT DETECTED
Candida glabrata: NOT DETECTED
ENTEROBACTERIACEAE SPECIES: NOT DETECTED
ENTEROCOCCUS SPECIES: NOT DETECTED
ESCHERICHIA COLI: NOT DETECTED
Enterobacter cloacae complex: NOT DETECTED
Haemophilus influenzae: NOT DETECTED
KLEBSIELLA OXYTOCA: NOT DETECTED
KLEBSIELLA PNEUMONIAE: NOT DETECTED
LISTERIA MONOCYTOGENES: NOT DETECTED
Neisseria meningitidis: NOT DETECTED
Proteus species: NOT DETECTED
Pseudomonas aeruginosa: NOT DETECTED
STREPTOCOCCUS PYOGENES: NOT DETECTED
Serratia marcescens: NOT DETECTED
Staphylococcus aureus (BCID): NOT DETECTED
Staphylococcus species: NOT DETECTED
Streptococcus agalactiae: NOT DETECTED
Streptococcus pneumoniae: NOT DETECTED
Streptococcus species: NOT DETECTED

## 2016-02-28 LAB — CBC
HCT: 38.4 % (ref 36.0–46.0)
Hemoglobin: 12 g/dL (ref 12.0–15.0)
MCH: 28.9 pg (ref 26.0–34.0)
MCHC: 31.3 g/dL (ref 30.0–36.0)
MCV: 92.5 fL (ref 78.0–100.0)
PLATELETS: 174 10*3/uL (ref 150–400)
RBC: 4.15 MIL/uL (ref 3.87–5.11)
RDW: 15.6 % — AB (ref 11.5–15.5)
WBC: 7.8 10*3/uL (ref 4.0–10.5)

## 2016-02-28 MED ORDER — POTASSIUM CHLORIDE CRYS ER 20 MEQ PO TBCR: 60.0000 meq | EXTENDED_RELEASE_TABLET | Freq: Once | ORAL | Status: DC

## 2016-02-28 MED ORDER — AZITHROMYCIN 250 MG PO TABS
250.0000 mg | ORAL_TABLET | Freq: Every day | ORAL | Status: DC
  Administered 2016-02-29: 250 mg via ORAL
  Filled 2016-02-28: qty 1

## 2016-02-28 MED ORDER — AZITHROMYCIN 500 MG PO TABS
500.0000 mg | ORAL_TABLET | Freq: Once | ORAL | Status: AC
  Administered 2016-02-28: 500 mg via ORAL
  Filled 2016-02-28: qty 1

## 2016-02-28 MED ORDER — LEVETIRACETAM 500 MG PO TABS
500.0000 mg | ORAL_TABLET | Freq: Two times a day (BID) | ORAL | Status: DC
  Administered 2016-02-28 (×2): 500 mg via ORAL
  Filled 2016-02-28 (×2): qty 1

## 2016-02-28 MED ORDER — DEXTROSE 5 % IV SOLN
1.0000 g | INTRAVENOUS | Status: DC
  Administered 2016-02-28 – 2016-02-29 (×2): 1 g via INTRAVENOUS
  Filled 2016-02-28 (×2): qty 10

## 2016-02-28 MED ORDER — VANCOMYCIN HCL 500 MG IV SOLR
500.0000 mg | INTRAVENOUS | Status: DC
  Filled 2016-02-28: qty 500

## 2016-02-28 MED ORDER — ENSURE ENLIVE PO LIQD
237.0000 mL | Freq: Two times a day (BID) | ORAL | Status: DC
  Administered 2016-02-29: 237 mL via ORAL

## 2016-02-28 MED ORDER — POTASSIUM CHLORIDE 20 MEQ/15ML (10%) PO SOLN
60.0000 meq | Freq: Once | ORAL | Status: DC
  Filled 2016-02-28: qty 45

## 2016-02-28 NOTE — Progress Notes (Signed)
Speech Language Pathology Treatment: Dysphagia  Patient Details Name: Tracy Reeves MRN: [id number] DOB: October 21, 1922 Today's Date: 02/28/2016 Time: [id number] SLP Time Calculation (min) (ACUTE ONLY): 12 min  Assessment / Plan / Recommendation Clinical Impression  SLP intervention following swallow assessment yesterday; no family present. She consumed thin water and solid texture with functional pharyngeal swallow via observation. Masticated small piece solid texture without difficulty however she is at risk for pocketing due to level of dementia. Continue to recommend Dys 1 (puree) and nectar thick liquids and family education re: relationship between dementia and swallow function.     HPI HPI: 82 y.o.femalea with advanced dementia, and speech difficulties at baseline, TBI, frequent UTI's, presenting with acute on chronic altered mental status. Per chart decreased intake. CXR No acute cardiopulmonary disease and emphysema. CT No acute cortically based infarct or acute intracranial hemorrhage. MRI No acute or reversible intracranial process identified. Swallow assessment 05/2014 followed for several days, initial po recommendation Dys 3, thin upgraded to regular thin the following day.       SLP Plan  Continue with current plan of care     Recommendations  Diet recommendations: Dysphagia 1 (puree);Nectar-thick liquid Liquids provided via: Cup;No straw Medication Administration: Crushed with puree Supervision: Staff to assist with self feeding;Full supervision/cueing for compensatory strategies Compensations: Slow rate;Minimize environmental distractions;Small sips/bites;Lingual sweep for clearance of pocketing Postural Changes and/or Swallow Maneuvers: Seated upright 90 degrees                Oral Care Recommendations: Oral care BID Follow up Recommendations: Skilled Nursing facility Plan: Continue with current plan of care       [place], Ellissa Ayo  [place] 02/28/2016, 12:22 PM  Orbie Pyo Colvin Caroli[employer] [phone]

## 2016-02-28 NOTE — Progress Notes (Signed)
Physical Therapy Evaluation Patient Details Name: Tracy Reeves MRN: [id number] DOB: 1922-05-29 Today's Date: 02/28/2016   History of Present Illness   82 y.o. female a with advanced dementia, and speech difficulties at baseline, presenting with acute on chronic altered mental status. MRI on 12/7 negative for acute infarct.   Clinical Impression  PTA, pt was w/c bound and required assist with all transfers and ADLs. Pt lives at [facility] memory care facility where she is with a team that cares for other pts with dementia. PT called pt's daughter, who confirmed that the team there was capable of transferring her and caring for all of her needs. Pt's daughter also expressed some concern with how she will transport her mother back to the facility. Please see general comments for more detail. Pt with max assist +2 to transfer from supine to sit, but required min assist +2 to perform stand pivot from bed to chair. Cognitive deficits likely play a role in difficulty with bed mobility. Pt would benefit from HHPT after d/c from hospital to address generalized weakness and education on transfers to increase level of independence. PT will continue to follow acutely in the hospital for functional mobility.      Follow Up Recommendations Home health PT;Supervision for mobility/OOB    Equipment Recommendations  None recommended by PT    Recommendations for Other Services       Precautions / Restrictions Precautions Precautions: Fall Restrictions Weight Bearing Restrictions: No      Mobility  Bed Mobility Overal bed mobility: Needs Assistance Bed Mobility: Rolling;Supine to Sit Rolling: Max assist   Supine to sit: Max assist;+2 for physical assistance     General bed mobility comments: Max assist +2 to move LEs and trunk upright. Pt able to pull on bed rail with UE to initiate sit to stand but needs cues for hand placement  Transfers Overall transfer level: Needs assistance Equipment  used: 2 person hand held assist Transfers: Sit to/from [facility] Sit to Stand: Min assist;+2 physical assistance Stand pivot transfers: Min assist;+2 physical assistance       General transfer comment: Pt able to assist with power up to stand and required min assist +2 to transfer to chair at bedside.   Ambulation/Gait                Stairs            Wheelchair Mobility    Modified Rankin (Stroke Patients Only)       Balance Overall balance assessment: Needs assistance Sitting-balance support: Feet supported;Bilateral upper extremity supported Sitting balance-Leahy Scale: Fair Sitting balance - Comments: Able to maintain sitting balance without physical assist. Postural control: Right lateral lean (occasional) Standing balance support: Bilateral upper extremity supported Standing balance-Leahy Scale: Poor Standing balance comment: B UE supported by +2 therapists in standing; pt with forward lean and requires support to maintain balance in static standing                             Pertinent Vitals/Pain Pain Assessment: Faces Faces Pain Scale: Hurts a little bit Pain Intervention(s): Monitored during session;Repositioned    Home Living Family/patient expects to be discharged to:: Skilled nursing facility                 Additional Comments: Pt from [facility] memory care unit.    Prior Function Level of Independence: Needs assistance   Gait / Transfers Assistance  Needed: Per daughter (on phone); staff from [facility] reports pt was w/c bound PTA.  ADL's / Homemaking Assistance Needed: Dependent with ADL PTA.  Comments: Daughter on phone reports that PTA, pt was w/c bound and required assist with transfer to all surfaces. Bed and chair alarms provided at facility. [facility] is provided at [facility] for pts with dementia     Hand Dominance        Extremity/Trunk Assessment   Upper Extremity  Assessment: Defer to OT evaluation           Lower Extremity Assessment: Generalized weakness      Cervical / Trunk Assessment: Kyphotic  Communication   Communication: Expressive difficulties  Cognition Arousal/Alertness: Awake/alert Behavior During Therapy: Flat affect Overall Cognitive Status: No family/caregiver present to determine baseline cognitive functioning                 General Comments: Known hx of advanced dementia; no caregiver present to confirm baseline. Pt inconsistently following one step commands.    General Comments General comments (skin integrity, edema, etc.): Daughter was called at end of session to confirm care provided at [facility] memory care unit. Pt daughter confirms that all needs will be provided. Daughter expressed concerns for how she will pick up her mother from the hospital as she is coming down with a cold and is concerned for the changing weather. RN notified and said she would call back later. Pt given update and notified that RN will be calling.    Exercises     Assessment/Plan    PT Assessment Patient needs continued PT services  PT Problem List Decreased strength;Decreased activity tolerance;Decreased balance;Decreased mobility;Decreased knowledge of use of DME;Decreased safety awareness          PT Treatment Interventions DME instruction;Therapeutic exercise;Therapeutic activities;Functional mobility training;Balance training    PT Goals (Current goals can be found in the Care Plan section)  Acute Rehab PT Goals Patient Stated Goal: pt unable to state PT Goal Formulation: Patient unable to participate in goal setting Time For Goal Achievement: 03/13/16 Potential to Achieve Goals: Fair    Frequency Min 3X/week   Barriers to discharge        Co-evaluation               End of Session Equipment Utilized During Treatment: Gait belt Activity Tolerance: Patient tolerated treatment well Patient left: in  chair;with call bell/phone within reach Nurse Communication: Mobility status    Functional Assessment Tool Used: clinical judgment Functional Limitation: Changing and maintaining body position Changing and Maintaining Body Position Current Status [id number]): At least 40 percent but less than 60 percent impaired, limited or restricted Changing and Maintaining Body Position Goal Status [id number]): At least 1 percent but less than 20 percent impaired, limited or restricted    Time: [id number] PT Time Calculation (min) (ACUTE ONLY): 29 min   Charges:   PT Evaluation $PT Eval Moderate Complexity: 1 Procedure PT Treatments $Therapeutic Activity: 8-22 mins   PT G Codes:   PT G-Codes **NOT FOR INPATIENT CLASS** Functional Assessment Tool Used: clinical judgment Functional Limitation: Changing and maintaining body position Changing and Maintaining Body Position Current Status [id number]): At least 40 percent but less than 60 percent impaired, limited or restricted Changing and Maintaining Body Position Goal Status [id number]): At least 1 percent but less than 20 percent impaired, limited or restricted    Tonia Brooms 02/28/2016, 1:04 PM Tonia Brooms, SPT [phone]

## 2016-02-28 NOTE — Progress Notes (Signed)
[facility] PROGRESS NOTE  Tracy Reeves [id number] DOB: 19-Jan-1923 DOA: 02/26/2016   PCP: [name], MD     Assessment/Plan: Active Problems:   Hypertension   Hypothyroid   Depression   Hypothyroidism due to acquired atrophy of thyroid   Essential hypertension   Altered mental status   Aphasia   82 y.o. female a with advanced dementia, and speech difficulties at baseline, presenting with acute on chronic altered mental status.She apparently had emesis after saying that she did not feel well at lunchtime and following this became essentially unresponsive. Now admitted for stroke workup   Assessment and plan Altered mental status/near syncope Etiology unclear. Concern for seizures versus CVA vs sepsis vs UTI. Also suspect viral URI  EkG NAD  No hypoxemia   CT of the head without acute abnormality. MRI No acute or reversible intracranial process identified . Afebrile WBC 9.7  Neuro unconvinced this is stroke related  EKG within normal limits Ammonia levels 25 UA negative Will await EEG and MRI. Echocardiogram SLP  Dysphagia 1 (Puree);Nectar-thick liquid  Repeat chest x-ray no PNA,   Acute bronchitis Patient is coughing,has low grade fever Start empiric abx for acute bronchitis  resp panel  Hypertension   Controlled Continue home anti-hypertensive medications when able to swallow   Hypothyroidism: -Continue home Synthroid  Atrial Fibrillation    Rate controlled -Continue meds  Histroy of Seizures on Depakote, - Depakote level 12 - Seizure precautions  Depression Continue home ZOloft    DVT prophylaxsis Lovenox  Code Status:  DNR     Family Communication: Discussed in detail with the patient, all imaging results, lab results explained to the patient   Disposition Plan: home health, dc 1-2 days     Consultants:   Neurology    Procedures:  None  Antibiotics: Anti-infectives    Start     Dose/Rate Route  Frequency Ordered Stop   02/28/16 0645  vancomycin (VANCOCIN) 500 mg in sodium chloride 0.9 % 100 mL IVPB     500 mg 100 mL/hr over 60 Minutes Intravenous Every 24 hours 02/28/16 0639           HPI/Subjective: Patient confused ,coughing   Objective: Vitals:   02/27/16 2124 02/28/16 0200 02/28/16 0500 02/28/16 0608  BP: (!) 163/68 (!) 156/62  (!) 164/62  Pulse: 64 61  62  Resp: 16 16  16   Temp: 98.1 F (36.7 C) 98.6 F (37 C)  98.7 F (37.1 C)  TempSrc: Axillary Axillary  Axillary  SpO2: 92% 91%  94%  Weight:   54 kg (119 lb 0.8 oz)   Height:        Intake/Output Summary (Last 24 hours) at 02/28/16 0925 Last data filed at 02/28/16 0057  Gross per 24 hour  Intake           1435.5 ml  Output                0 ml  Net           1435.5 ml    Exam:  Examination:  General exam: Appears calm and comfortable  Respiratory system: Clear to auscultation. Respiratory effort normal. Cardiovascular system: S1 & S2 heard, RRR. No JVD, murmurs, rubs, gallops or clicks. No pedal edema. Gastrointestinal system: Abdomen is nondistended, soft and nontender. No organomegaly or masses felt. Normal bowel sounds heard. Central nervous system: Alert and oriented. No focal neurological deficits. Extremities: Symmetric 5 x 5 power.  Skin: No rashes, lesions or ulcers Psychiatry: Judgement and insight appear normal. Mood & affect appropriate.     Data Reviewed: I have personally reviewed following labs and imaging studies  Micro Results Recent Results (from the past 240 hour(s))  Culture, blood (Routine X 2) w Reflex to ID Panel     Status: None (Preliminary result)   Collection Time: 02/26/16  5:20 PM  Result Value Ref Range Status   Specimen Description BLOOD LEFT ANTECUBITAL  Final   Special Requests BOTTLES DRAWN AEROBIC AND ANAEROBIC 5CC  Final   Culture  Setup Time   Final    GRAM POSITIVE COCCI IN CLUSTERS AEROBIC BOTTLE ONLY Organism ID to follow CRITICAL RESULT CALLED TO,  READ BACK BY AND VERIFIED WITH: JAMES LEDFORD, PHARMD @0258  02/28/16 MKELLY,MLT    Culture NO GROWTH < 24 HOURS  Final   Report Status PENDING  Incomplete  Blood Culture ID Panel (Reflexed)     Status: None   Collection Time: 02/26/16  5:20 PM  Result Value Ref Range Status   Enterococcus species NOT DETECTED NOT DETECTED Final   Listeria monocytogenes NOT DETECTED NOT DETECTED Final   Staphylococcus species NOT DETECTED NOT DETECTED Final   Staphylococcus aureus NOT DETECTED NOT DETECTED Final   Streptococcus species NOT DETECTED NOT DETECTED Final   Streptococcus agalactiae NOT DETECTED NOT DETECTED Final   Streptococcus pneumoniae NOT DETECTED NOT DETECTED Final   Streptococcus pyogenes NOT DETECTED NOT DETECTED Final   Acinetobacter baumannii NOT DETECTED NOT DETECTED Final   Enterobacteriaceae species NOT DETECTED NOT DETECTED Final   Enterobacter cloacae complex NOT DETECTED NOT DETECTED Final   Escherichia coli NOT DETECTED NOT DETECTED Final   Klebsiella oxytoca NOT DETECTED NOT DETECTED Final   Klebsiella pneumoniae NOT DETECTED NOT DETECTED Final   Proteus species NOT DETECTED NOT DETECTED Final   Serratia marcescens NOT DETECTED NOT DETECTED Final   Haemophilus influenzae NOT DETECTED NOT DETECTED Final   Neisseria meningitidis NOT DETECTED NOT DETECTED Final   Pseudomonas aeruginosa NOT DETECTED NOT DETECTED Final   Candida albicans NOT DETECTED NOT DETECTED Final   Candida glabrata NOT DETECTED NOT DETECTED Final   Candida krusei NOT DETECTED NOT DETECTED Final   Candida parapsilosis NOT DETECTED NOT DETECTED Final   Candida tropicalis NOT DETECTED NOT DETECTED Final  Culture, blood (Routine X 2) w Reflex to ID Panel     Status: None (Preliminary result)   Collection Time: 02/26/16  5:25 PM  Result Value Ref Range Status   Specimen Description BLOOD LEFT WRIST  Final   Special Requests BOTTLES DRAWN AEROBIC AND ANAEROBIC 5CC  Final   Culture NO GROWTH < 24 HOURS   Final   Report Status PENDING  Incomplete    Radiology Reports Ct Angio Head W Or Wo Contrast  Result Date: 02/26/2016 CLINICAL DATA:  82 year old female code stroke. Language deficit. Initial encounter. EXAM: CT ANGIOGRAPHY HEAD AND NECK TECHNIQUE: Multidetector CT imaging of the head and neck was performed using the standard protocol during bolus administration of intravenous contrast. Multiplanar CT image reconstructions and MIPs were obtained to evaluate the vascular anatomy. Carotid stenosis measurements (when applicable) are obtained utilizing NASCET criteria, using the distal internal carotid diameter as the denominator. CONTRAST:  50 mL Isovue 370. COMPARISON:  Head CT without contrast 1343 hours today. FINDINGS: CTA NECK Skeleton: Degenerative changes in the spine. Up to severe cervical facet arthropathy greater on the right. Exaggerated upper thoracic kyphosis. No acute osseous abnormality identified. Visualized paranasal  sinuses and mastoids are stable and well pneumatized. Upper chest: Negative lung apices. Negative visualized superior mediastinum. Other neck: Thyromegaly. Subcentimeter bilateral thyroid nodules. No significant mass effect on the airway. Negative larynx, pharynx, parapharyngeal spaces, retropharyngeal space, sublingual space, submandibular glands and parotid glands. No cervical lymphadenopathy. No acute orbit or scalp soft tissue findings. Aortic arch: 3 vessel arch configuration, the entire arch is not included. No great vessel origin stenosis suspected. Right carotid system: Negative right cervical carotids aside from tortuosity and mild soft and calcified plaque at the right carotid bifurcation. Left carotid system: Tortuous left CCA without stenosis proximal to the bifurcation. Mild to moderate soft and calcified plaque at the left carotid bifurcation. Stenosis is less than 50 % with respect to the distal vessel. Tortuous left ICA distal to the bulb. No stenosis. Vertebral  arteries:New line no proximal right subclavian or right vertebral artery stenosis. Tortuous but otherwise negative cervical right vertebral artery. No proximal left subclavian artery stenosis despite soft plaque. Normal left vertebral artery origin. Tortuous left V1 segment. No left vertebral artery stenosis in the neck. CTA HEAD Posterior circulation: Patent distal vertebral arteries without stenosis. Mild left V4 segment calcified plaque. Patent PICA origins. No basilar stenosis. Normal SCA and left PCA origins. Fetal type right PCA origin. Left posterior communicating artery diminutive or absent. Bilateral PCA branches are within normal limits. Anterior circulation: Both ICA siphons are patent. Mild to moderate calcified siphon atherosclerosis. Only mild subsequent supraclinoid segment stenosis bilaterally. Ophthalmic and right posterior communicating artery origins are normal. Patent carotid termini. Normal MCA and ACA origins aside from mild calcified plaque. Diminutive or absent anterior communicating artery. Bilateral ACA branches are within normal limits. Right MCA branches are within normal limits. Left MCA M1 segment, left MCA bifurcation, and left MCA branches are within normal limits. Venous sinuses: Not well evaluated due to early contrast timing on these images. Anatomic variants: Fetal type right PCA origin. Review of the MIP images confirms the above findings IMPRESSION: 1. Negative for emergent large vessel occlusion. Preliminary report of this was relayed via text pager to Dr. Roland Rack on 02/26/2016 at 1407 hours. 2. Mild for age mostly calcified atherosclerosis in the head and neck. No hemodynamically significant arterial stenosis identified. 3. Thyromegaly compatible with benign goiter. Electronically Signed   By: Genevie Ann M.D.   On: 02/26/2016 14:17   Ct Angio Neck W Or Wo Contrast  Result Date: 02/26/2016 CLINICAL DATA:  82 year old female code stroke. Language deficit. Initial  encounter. EXAM: CT ANGIOGRAPHY HEAD AND NECK TECHNIQUE: Multidetector CT imaging of the head and neck was performed using the standard protocol during bolus administration of intravenous contrast. Multiplanar CT image reconstructions and MIPs were obtained to evaluate the vascular anatomy. Carotid stenosis measurements (when applicable) are obtained utilizing NASCET criteria, using the distal internal carotid diameter as the denominator. CONTRAST:  50 mL Isovue 370. COMPARISON:  Head CT without contrast 1343 hours today. FINDINGS: CTA NECK Skeleton: Degenerative changes in the spine. Up to severe cervical facet arthropathy greater on the right. Exaggerated upper thoracic kyphosis. No acute osseous abnormality identified. Visualized paranasal sinuses and mastoids are stable and well pneumatized. Upper chest: Negative lung apices. Negative visualized superior mediastinum. Other neck: Thyromegaly. Subcentimeter bilateral thyroid nodules. No significant mass effect on the airway. Negative larynx, pharynx, parapharyngeal spaces, retropharyngeal space, sublingual space, submandibular glands and parotid glands. No cervical lymphadenopathy. No acute orbit or scalp soft tissue findings. Aortic arch: 3 vessel arch configuration, the entire arch is not  included. No great vessel origin stenosis suspected. Right carotid system: Negative right cervical carotids aside from tortuosity and mild soft and calcified plaque at the right carotid bifurcation. Left carotid system: Tortuous left CCA without stenosis proximal to the bifurcation. Mild to moderate soft and calcified plaque at the left carotid bifurcation. Stenosis is less than 50 % with respect to the distal vessel. Tortuous left ICA distal to the bulb. No stenosis. Vertebral arteries:New line no proximal right subclavian or right vertebral artery stenosis. Tortuous but otherwise negative cervical right vertebral artery. No proximal left subclavian artery stenosis despite  soft plaque. Normal left vertebral artery origin. Tortuous left V1 segment. No left vertebral artery stenosis in the neck. CTA HEAD Posterior circulation: Patent distal vertebral arteries without stenosis. Mild left V4 segment calcified plaque. Patent PICA origins. No basilar stenosis. Normal SCA and left PCA origins. Fetal type right PCA origin. Left posterior communicating artery diminutive or absent. Bilateral PCA branches are within normal limits. Anterior circulation: Both ICA siphons are patent. Mild to moderate calcified siphon atherosclerosis. Only mild subsequent supraclinoid segment stenosis bilaterally. Ophthalmic and right posterior communicating artery origins are normal. Patent carotid termini. Normal MCA and ACA origins aside from mild calcified plaque. Diminutive or absent anterior communicating artery. Bilateral ACA branches are within normal limits. Right MCA branches are within normal limits. Left MCA M1 segment, left MCA bifurcation, and left MCA branches are within normal limits. Venous sinuses: Not well evaluated due to early contrast timing on these images. Anatomic variants: Fetal type right PCA origin. Review of the MIP images confirms the above findings IMPRESSION: 1. Negative for emergent large vessel occlusion. Preliminary report of this was relayed via text pager to Dr. Roland Rack on 02/26/2016 at 1407 hours. 2. Mild for age mostly calcified atherosclerosis in the head and neck. No hemodynamically significant arterial stenosis identified. 3. Thyromegaly compatible with benign goiter. Electronically Signed   By: Genevie Ann M.D.   On: 02/26/2016 14:17   Mr Brain Wo Contrast  Result Date: 02/27/2016 CLINICAL DATA:  Initial evaluation for acute on chronic altered mental status. Vomiting. EXAM: MRI HEAD WITHOUT CONTRAST TECHNIQUE: Multiplanar, multiecho pulse sequences of the brain and surrounding structures were obtained without intravenous contrast. COMPARISON:  Prior CTs from  02/26/2016. FINDINGS: Brain: Study moderately degraded by motion artifact. Diffuse prominence of the CSF containing spaces is compatible with generalized age-related cerebral atrophy. Atrophy most predominantly in the temporal lobes bilaterally with associated ex vacuo dilatation of the temporal horns of both lateral ventricles. Mild cerebellar atrophy noted as well. Patchy T2/FLAIR hyperintensity within the periventricular and deep white matter both cerebral hemispheres most like related to chronic microvascular ischemic disease, mild for age. No abnormal foci of restricted diffusion to suggest acute or subacute ischemia. Gray-white matter differentiation maintained. No evidence for acute or chronic intracranial hemorrhage. No encephalomalacia to suggest chronic infarction. No mass lesion, midline shift or mass effect. Mild ventricular prominence most like related to underlying cerebral atrophy. No hydrocephalus. No extra-axial fluid collection. Major dural sinuses are grossly patent. Vascular: Major intracranial vascular flow voids are well preserved. Skull and upper cervical spine: Craniocervical junction within normal limits. Visualized upper cervical spine grossly unremarkable. Bone marrow signal intensity grossly normal. No scalp soft tissue abnormality. Sinuses/Orbits: Globes and orbital soft tissues grossly unremarkable on this motion degraded study. Mild scattered mucosal thickening within the paranasal sinuses. Small amount of fluid noted layering within the posterior nasopharynx. Trace opacity left mastoid air cells. IMPRESSION: 1. No acute or reversible  intracranial process identified. 2. Temporal lobe predominant cerebral atrophy. 3. Mild for age chronic microvascular ischemic disease. Electronically Signed   By: Jeannine Boga M.D.   On: 02/27/2016 12:34   Dg Chest Port 1 View  Result Date: 02/27/2016 CLINICAL DATA:  Weakness.  Shortness of breath. EXAM: PORTABLE CHEST 1 VIEW COMPARISON:   February 26, 2016 FINDINGS: Prominent skin fold over the lateral left chest with lung markings on both sides. No pneumothorax. Mild atelectasis in the left base. No other change. IMPRESSION: No active disease. Electronically Signed   By: Dorise Bullion III M.D   On: 02/27/2016 12:47   Dg Chest Portable 1 View  Result Date: 02/26/2016 CLINICAL DATA:  Hypoxia. EXAM: PORTABLE CHEST 1 VIEW COMPARISON:  One-view chest x-ray 11/07/2014. FINDINGS: The heart is enlarged. Atherosclerotic calcifications are present at the aortic arch. There is no edema or effusion to suggest failure. No focal airspace disease is present. Emphysematous changes are again noted. A remote left humerus fracture is noted with probable pseudoarticulation. Advanced degenerative changes are noted at the right shoulder. The visualized soft tissues and bony thorax are otherwise unremarkable. IMPRESSION: 1. No acute cardiopulmonary disease. 2. Cardiomegaly without failure. 3. Aortic atherosclerosis. 4. Emphysema. 5. Remote left humerus fracture with probable pseudoarticulation. Electronically Signed   By: San Morelle M.D.   On: 02/26/2016 14:39   Ct Head Code Stroke W/o Cm  Result Date: 02/26/2016 CLINICAL DATA:  Code stroke. 82 year old female with aphasia. Last seen normal 1245 hours. Initial encounter. EXAM: CT HEAD WITHOUT CONTRAST TECHNIQUE: Contiguous axial images were obtained from the base of the skull through the vertex without intravenous contrast. COMPARISON:  Head CT without contrast 01/23/2016 and earlier. FINDINGS: Brain: Stable cerebral volume. No midline shift, mass effect, or evidence of intracranial mass lesion. Stable ventricle size and configuration. Confluent cerebral white matter hypodensity is stable. Small areas of cortical encephalomalacia along the lateral left temporal lobe and lateral left occipital lobe appear stable. More pronounced encephalomalacia along the anterior right temporal lobe appears stable. No  acute intracranial hemorrhage identified. No cortically based acute infarct identified. Vascular: No suspicious intracranial vascular hyperdensity. Skull: No acute osseous abnormality identified. Sinuses/Orbits: Visualized paranasal sinuses and mastoids are stable and well pneumatized. Other: No acute orbit or scalp soft tissue finding. ASPECTS [facility] Stroke Program Early CT Score) - Ganglionic level infarction (caudate, lentiform nuclei, internal capsule, insula, M1-M3 cortex): 7 - Supraganglionic infarction (M4-M6 cortex): 3 Total score (0-10 with 10 being normal): 10 IMPRESSION: 1. No acute cortically based infarct or acute intracranial hemorrhage. 2. ASPECTS is 10. 3. Stable encephalomalacia along the anterior right temporal lobe and lateral left temporal and occipital lobes. 4. The above was relayed via text pager to Dr. Roland Rack on 02/26/2016 at 13:52 . Electronically Signed   By: Genevie Ann M.D.   On: 02/26/2016 13:52     CBC  Recent Labs Lab 02/26/16 1345 02/26/16 1351 02/27/16 0012 02/28/16 0553  WBC 9.7  --  8.4 7.8  HGB 13.5 14.6 12.6 12.0  HCT 42.9 43.0 40.1 38.4  PLT 234  --  214 174  MCV 92.7  --  91.8 92.5  MCH 29.2  --  28.8 28.9  MCHC 31.5  --  31.4 31.3  RDW 15.4  --  15.4 15.6*  LYMPHSABS 2.2  --   --   --   MONOABS 1.0  --   --   --   EOSABS 0.0  --   --   --  BASOSABS 0.0  --   --   --     Chemistries   Recent Labs Lab 02/26/16 1345 02/26/16 1351 02/27/16 0012 02/28/16 0553  NA 140 140 140 139  K 4.6 4.5 3.7 3.1*  CL 101 100* 101 102  CO2 32  --  29 27  GLUCOSE 154* 150* 109* 81  BUN 21* 27* 19 17  CREATININE 1.30* 1.20* 0.99 0.85  CALCIUM 8.9  --  8.7* 8.4*  AST 26  --  23 18  ALT 16  --  15 13*  ALKPHOS 60  --  51 44  BILITOT 0.8  --  0.6 0.8   ------------------------------------------------------------------------------------------------------------------ estimated creatinine clearance is 31 mL/min (by C-G formula based on SCr of 0.85  mg/dL). ------------------------------------------------------------------------------------------------------------------ No results for input(s): HGBA1C in the last 72 hours. ------------------------------------------------------------------------------------------------------------------ No results for input(s): CHOL, HDL, LDLCALC, TRIG, CHOLHDL, LDLDIRECT in the last 72 hours. ------------------------------------------------------------------------------------------------------------------ No results for input(s): TSH, T4TOTAL, T3FREE, THYROIDAB in the last 72 hours.  Invalid input(s): FREET3 ------------------------------------------------------------------------------------------------------------------ No results for input(s): VITAMINB12, FOLATE, FERRITIN, TIBC, IRON, RETICCTPCT in the last 72 hours.  Coagulation profile  Recent Labs Lab 02/26/16 1345  INR 1.06    No results for input(s): DDIMER in the last 72 hours.  Cardiac Enzymes No results for input(s): CKMB, TROPONINI, MYOGLOBIN in the last 168 hours.  Invalid input(s): CK ------------------------------------------------------------------------------------------------------------------ Invalid input(s): POCBNP   CBG:  Recent Labs Lab 02/26/16 1340  GLUCAP 108*       Studies: Ct Angio Head W Or Wo Contrast  Result Date: 02/26/2016 CLINICAL DATA:  82 year old female code stroke. Language deficit. Initial encounter. EXAM: CT ANGIOGRAPHY HEAD AND NECK TECHNIQUE: Multidetector CT imaging of the head and neck was performed using the standard protocol during bolus administration of intravenous contrast. Multiplanar CT image reconstructions and MIPs were obtained to evaluate the vascular anatomy. Carotid stenosis measurements (when applicable) are obtained utilizing NASCET criteria, using the distal internal carotid diameter as the denominator. CONTRAST:  50 mL Isovue 370. COMPARISON:  Head CT without contrast 1343  hours today. FINDINGS: CTA NECK Skeleton: Degenerative changes in the spine. Up to severe cervical facet arthropathy greater on the right. Exaggerated upper thoracic kyphosis. No acute osseous abnormality identified. Visualized paranasal sinuses and mastoids are stable and well pneumatized. Upper chest: Negative lung apices. Negative visualized superior mediastinum. Other neck: Thyromegaly. Subcentimeter bilateral thyroid nodules. No significant mass effect on the airway. Negative larynx, pharynx, parapharyngeal spaces, retropharyngeal space, sublingual space, submandibular glands and parotid glands. No cervical lymphadenopathy. No acute orbit or scalp soft tissue findings. Aortic arch: 3 vessel arch configuration, the entire arch is not included. No great vessel origin stenosis suspected. Right carotid system: Negative right cervical carotids aside from tortuosity and mild soft and calcified plaque at the right carotid bifurcation. Left carotid system: Tortuous left CCA without stenosis proximal to the bifurcation. Mild to moderate soft and calcified plaque at the left carotid bifurcation. Stenosis is less than 50 % with respect to the distal vessel. Tortuous left ICA distal to the bulb. No stenosis. Vertebral arteries:New line no proximal right subclavian or right vertebral artery stenosis. Tortuous but otherwise negative cervical right vertebral artery. No proximal left subclavian artery stenosis despite soft plaque. Normal left vertebral artery origin. Tortuous left V1 segment. No left vertebral artery stenosis in the neck. CTA HEAD Posterior circulation: Patent distal vertebral arteries without stenosis. Mild left V4 segment calcified plaque. Patent PICA origins. No basilar stenosis. Normal SCA and left PCA origins.  Fetal type right PCA origin. Left posterior communicating artery diminutive or absent. Bilateral PCA branches are within normal limits. Anterior circulation: Both ICA siphons are patent. Mild to  moderate calcified siphon atherosclerosis. Only mild subsequent supraclinoid segment stenosis bilaterally. Ophthalmic and right posterior communicating artery origins are normal. Patent carotid termini. Normal MCA and ACA origins aside from mild calcified plaque. Diminutive or absent anterior communicating artery. Bilateral ACA branches are within normal limits. Right MCA branches are within normal limits. Left MCA M1 segment, left MCA bifurcation, and left MCA branches are within normal limits. Venous sinuses: Not well evaluated due to early contrast timing on these images. Anatomic variants: Fetal type right PCA origin. Review of the MIP images confirms the above findings IMPRESSION: 1. Negative for emergent large vessel occlusion. Preliminary report of this was relayed via text pager to Dr. Roland Rack on 02/26/2016 at 1407 hours. 2. Mild for age mostly calcified atherosclerosis in the head and neck. No hemodynamically significant arterial stenosis identified. 3. Thyromegaly compatible with benign goiter. Electronically Signed   By: Genevie Ann M.D.   On: 02/26/2016 14:17   Ct Angio Neck W Or Wo Contrast  Result Date: 02/26/2016 CLINICAL DATA:  82 year old female code stroke. Language deficit. Initial encounter. EXAM: CT ANGIOGRAPHY HEAD AND NECK TECHNIQUE: Multidetector CT imaging of the head and neck was performed using the standard protocol during bolus administration of intravenous contrast. Multiplanar CT image reconstructions and MIPs were obtained to evaluate the vascular anatomy. Carotid stenosis measurements (when applicable) are obtained utilizing NASCET criteria, using the distal internal carotid diameter as the denominator. CONTRAST:  50 mL Isovue 370. COMPARISON:  Head CT without contrast 1343 hours today. FINDINGS: CTA NECK Skeleton: Degenerative changes in the spine. Up to severe cervical facet arthropathy greater on the right. Exaggerated upper thoracic kyphosis. No acute osseous  abnormality identified. Visualized paranasal sinuses and mastoids are stable and well pneumatized. Upper chest: Negative lung apices. Negative visualized superior mediastinum. Other neck: Thyromegaly. Subcentimeter bilateral thyroid nodules. No significant mass effect on the airway. Negative larynx, pharynx, parapharyngeal spaces, retropharyngeal space, sublingual space, submandibular glands and parotid glands. No cervical lymphadenopathy. No acute orbit or scalp soft tissue findings. Aortic arch: 3 vessel arch configuration, the entire arch is not included. No great vessel origin stenosis suspected. Right carotid system: Negative right cervical carotids aside from tortuosity and mild soft and calcified plaque at the right carotid bifurcation. Left carotid system: Tortuous left CCA without stenosis proximal to the bifurcation. Mild to moderate soft and calcified plaque at the left carotid bifurcation. Stenosis is less than 50 % with respect to the distal vessel. Tortuous left ICA distal to the bulb. No stenosis. Vertebral arteries:New line no proximal right subclavian or right vertebral artery stenosis. Tortuous but otherwise negative cervical right vertebral artery. No proximal left subclavian artery stenosis despite soft plaque. Normal left vertebral artery origin. Tortuous left V1 segment. No left vertebral artery stenosis in the neck. CTA HEAD Posterior circulation: Patent distal vertebral arteries without stenosis. Mild left V4 segment calcified plaque. Patent PICA origins. No basilar stenosis. Normal SCA and left PCA origins. Fetal type right PCA origin. Left posterior communicating artery diminutive or absent. Bilateral PCA branches are within normal limits. Anterior circulation: Both ICA siphons are patent. Mild to moderate calcified siphon atherosclerosis. Only mild subsequent supraclinoid segment stenosis bilaterally. Ophthalmic and right posterior communicating artery origins are normal. Patent carotid  termini. Normal MCA and ACA origins aside from mild calcified plaque. Diminutive or absent anterior communicating  artery. Bilateral ACA branches are within normal limits. Right MCA branches are within normal limits. Left MCA M1 segment, left MCA bifurcation, and left MCA branches are within normal limits. Venous sinuses: Not well evaluated due to early contrast timing on these images. Anatomic variants: Fetal type right PCA origin. Review of the MIP images confirms the above findings IMPRESSION: 1. Negative for emergent large vessel occlusion. Preliminary report of this was relayed via text pager to Dr. Roland Rack on 02/26/2016 at 1407 hours. 2. Mild for age mostly calcified atherosclerosis in the head and neck. No hemodynamically significant arterial stenosis identified. 3. Thyromegaly compatible with benign goiter. Electronically Signed   By: Genevie Ann M.D.   On: 02/26/2016 14:17   Mr Brain Wo Contrast  Result Date: 02/27/2016 CLINICAL DATA:  Initial evaluation for acute on chronic altered mental status. Vomiting. EXAM: MRI HEAD WITHOUT CONTRAST TECHNIQUE: Multiplanar, multiecho pulse sequences of the brain and surrounding structures were obtained without intravenous contrast. COMPARISON:  Prior CTs from 02/26/2016. FINDINGS: Brain: Study moderately degraded by motion artifact. Diffuse prominence of the CSF containing spaces is compatible with generalized age-related cerebral atrophy. Atrophy most predominantly in the temporal lobes bilaterally with associated ex vacuo dilatation of the temporal horns of both lateral ventricles. Mild cerebellar atrophy noted as well. Patchy T2/FLAIR hyperintensity within the periventricular and deep white matter both cerebral hemispheres most like related to chronic microvascular ischemic disease, mild for age. No abnormal foci of restricted diffusion to suggest acute or subacute ischemia. Gray-white matter differentiation maintained. No evidence for acute or chronic  intracranial hemorrhage. No encephalomalacia to suggest chronic infarction. No mass lesion, midline shift or mass effect. Mild ventricular prominence most like related to underlying cerebral atrophy. No hydrocephalus. No extra-axial fluid collection. Major dural sinuses are grossly patent. Vascular: Major intracranial vascular flow voids are well preserved. Skull and upper cervical spine: Craniocervical junction within normal limits. Visualized upper cervical spine grossly unremarkable. Bone marrow signal intensity grossly normal. No scalp soft tissue abnormality. Sinuses/Orbits: Globes and orbital soft tissues grossly unremarkable on this motion degraded study. Mild scattered mucosal thickening within the paranasal sinuses. Small amount of fluid noted layering within the posterior nasopharynx. Trace opacity left mastoid air cells. IMPRESSION: 1. No acute or reversible intracranial process identified. 2. Temporal lobe predominant cerebral atrophy. 3. Mild for age chronic microvascular ischemic disease. Electronically Signed   By: Jeannine Boga M.D.   On: 02/27/2016 12:34   Dg Chest Port 1 View  Result Date: 02/27/2016 CLINICAL DATA:  Weakness.  Shortness of breath. EXAM: PORTABLE CHEST 1 VIEW COMPARISON:  February 26, 2016 FINDINGS: Prominent skin fold over the lateral left chest with lung markings on both sides. No pneumothorax. Mild atelectasis in the left base. No other change. IMPRESSION: No active disease. Electronically Signed   By: Dorise Bullion III M.D   On: 02/27/2016 12:47   Dg Chest Portable 1 View  Result Date: 02/26/2016 CLINICAL DATA:  Hypoxia. EXAM: PORTABLE CHEST 1 VIEW COMPARISON:  One-view chest x-ray 11/07/2014. FINDINGS: The heart is enlarged. Atherosclerotic calcifications are present at the aortic arch. There is no edema or effusion to suggest failure. No focal airspace disease is present. Emphysematous changes are again noted. A remote left humerus fracture is noted with  probable pseudoarticulation. Advanced degenerative changes are noted at the right shoulder. The visualized soft tissues and bony thorax are otherwise unremarkable. IMPRESSION: 1. No acute cardiopulmonary disease. 2. Cardiomegaly without failure. 3. Aortic atherosclerosis. 4. Emphysema. 5. Remote left humerus fracture  with probable pseudoarticulation. Electronically Signed   By: San Morelle M.D.   On: 02/26/2016 14:39   Ct Head Code Stroke W/o Cm  Result Date: 02/26/2016 CLINICAL DATA:  Code stroke. 82 year old female with aphasia. Last seen normal 1245 hours. Initial encounter. EXAM: CT HEAD WITHOUT CONTRAST TECHNIQUE: Contiguous axial images were obtained from the base of the skull through the vertex without intravenous contrast. COMPARISON:  Head CT without contrast 01/23/2016 and earlier. FINDINGS: Brain: Stable cerebral volume. No midline shift, mass effect, or evidence of intracranial mass lesion. Stable ventricle size and configuration. Confluent cerebral white matter hypodensity is stable. Small areas of cortical encephalomalacia along the lateral left temporal lobe and lateral left occipital lobe appear stable. More pronounced encephalomalacia along the anterior right temporal lobe appears stable. No acute intracranial hemorrhage identified. No cortically based acute infarct identified. Vascular: No suspicious intracranial vascular hyperdensity. Skull: No acute osseous abnormality identified. Sinuses/Orbits: Visualized paranasal sinuses and mastoids are stable and well pneumatized. Other: No acute orbit or scalp soft tissue finding. ASPECTS [facility] Stroke Program Early CT Score) - Ganglionic level infarction (caudate, lentiform nuclei, internal capsule, insula, M1-M3 cortex): 7 - Supraganglionic infarction (M4-M6 cortex): 3 Total score (0-10 with 10 being normal): 10 IMPRESSION: 1. No acute cortically based infarct or acute intracranial hemorrhage. 2. ASPECTS is 10. 3. Stable encephalomalacia  along the anterior right temporal lobe and lateral left temporal and occipital lobes. 4. The above was relayed via text pager to Dr. Roland Rack on 02/26/2016 at 13:52 . Electronically Signed   By: Genevie Ann M.D.   On: 02/26/2016 13:52      No results found for: HGBA1C Lab Results  Component Value Date   CREATININE 0.85 02/28/2016       Scheduled Meds: . amiodarone  100 mg Oral Daily  . divalproex  125 mg Oral Q12H  . levETIRAcetam  500 mg Oral BID  . pantoprazole  40 mg Oral Daily  . sertraline  75 mg Oral Daily  . sodium chloride flush  3 mL Intravenous Q12H  . vancomycin  500 mg Intravenous Q24H   Continuous Infusions: . sodium chloride 75 mL/hr at 02/28/16 0057     LOS: 0 days    Time spent: >30 MINS    [facility]  [facility] Pager [phone]. If 7PM-7AM, please contact night-coverage at [email or web address], password [facility] 02/28/2016, 9:25 AM  LOS: 0 days

## 2016-02-28 NOTE — Progress Notes (Signed)
Initial Nutrition Assessment   INTERVENTION:  Provide Ensure Enlive po BID, each supplement provides 350 kcal and 20 grams of protein   NUTRITION DIAGNOSIS:   Predicted suboptimal nutrient intake related to lethargy/confusion as evidenced by meal completion < 25%.   GOAL:   Patient will meet greater than or equal to 90% of their needs   MONITOR:   Supplement acceptance, PO intake, Skin, I & O's, Labs, Weight trends  REASON FOR ASSESSMENT:   Low Braden    ASSESSMENT:   82 y.o. female a with advanced dementia, and speech difficulties at baseline, presenting with acute on chronic altered mental status. Patient was eating breakfast this morning, when she began to be less responsive, and have an episode of emesis. She was transferred to the hospital by EMS, at which time code stroke was initiated. Since that time, the patient's overall mental status improved.  Pt difficulty to assess due to mental status and no family at bedside. She states that her appetite is good however, RD noted lunch and dinner tray at bedside untouched. RD asked if patient was ready to eat dinner and she replied "not yet". RD asked if patient drinks Ensure or Boost or a nutrition drink at home and she replied Ensure. Weight history is difficult to assess due to fluctuations. She has some mild muscle wasting in temples and acromion region, but otherwise appears well-nourished.   Labs: low potassium, low calcium, low albumin  Diet Order:  DIET - DYS 1 Room service appropriate? Yes; Fluid consistency: Nectar Thick  Skin:  Reviewed, no issues  Last BM:  12/8  Height:   Ht Readings from Last 1 Encounters:  02/26/16 5' (1.524 m)    Weight:   Wt Readings from Last 1 Encounters:  02/28/16 119 lb 0.8 oz (54 kg)    Ideal Body Weight:  45.5 kg  BMI:  Body mass index is 23.25 kg/m.  Estimated Nutritional Needs:   Kcal:  1300-1500  Protein:  65-75 grams  Fluid:  1.5 L/day  EDUCATION NEEDS:   No  education needs identified at this time  [place], CSP, LDN Inpatient Clinical Dietitian Pager: [phone] After Hours Pager: [phone]

## 2016-02-28 NOTE — Progress Notes (Signed)
Pharmacy Antibiotic Note  Tracy Reeves is a 82 y.o. female admitted on 02/26/2016 with AMS. Blood cultures were positive in 1 of 2 for GPC in clusters, but BCID undetectable. MD wanted to initiate vancomycin, then discontinued before any doses were given due to high likelihood for contamination. Pharmacy has now been consulted for azithromycin/ceftriaxone dosing for possible acute bronchitis.   Plan: -Ceftriaxone 1gm IV q24h -Azithromycin 500mg  PO today, then 250mg  PO daily starting tomorrow -Follow-up LOT, cultures, S/Sx infection  Height: 5' (152.4 cm) Weight: 119 lb 0.8 oz (54 kg) IBW/kg (Calculated) : 45.5  Temp (24hrs), Avg:98.8 F (37.1 C), Min:97.6 F (36.4 C), Max:100.1 F (37.8 C)   Recent Labs Lab 02/26/16 1345 02/26/16 1351 02/27/16 0012 02/28/16 0553  WBC 9.7  --  8.4 7.8  CREATININE 1.30* 1.20* 0.99 0.85    Estimated Creatinine Clearance: 31 mL/min (by C-G formula based on SCr of 0.85 mg/dL).    Allergies  Allergen Reactions  . Ace Inhibitors Other (See Comments)  . Other Other (See Comments)    Uncoded Allergy. Allergen: eggs, Other Reaction: GI Upset  . Eggs Or Egg-Derived Products Other (See Comments)    Unknown reaction    Antimicrobials this admission:  12/8 Ceftriaxone >>  12/8 Azithromycin >>  12/8 Vancomycin >> 12/8 (dose ordered, never given)  Dose adjustments this admission:  none  Microbiology results:  12/6 BCx: GPC in clusters in aerobic bottle only, BCID undetectable  Thank you for allowing pharmacy to be a part of this patient's care.  Arrie Senate, PharmD PGY-1 Pharmacy Resident Pager: [phone] 02/28/2016

## 2016-02-28 NOTE — Progress Notes (Signed)
Subjective: Patient is fairly nonverbal. She is sitting at the edge the bed with physical therapy. She follows no commands. When a pen is held in front of her face didn't asked what this is she tells me it is a star  Exam: Vitals:   02/28/16 0200 02/28/16 0608  BP: (!) 156/62 (!) 164/62  Pulse: 61 62  Resp: 16 16  Temp: 98.6 F (37 C) 98.7 F (37.1 C)        Gen: In bed, NAD MS: Able to sit at the edge the bed. Follows no commands. Continues to be nonverbal. Does not track my movements and tends to look down at the floor. She does state out chin withdraws to pain CN: Sensation is intact pupils are equal round reactive, eyes move horizontally however this is difficult as she is looking down at the floor. She does blink to threat. Motor: no tremor noted today patient is sitting slumped over at the edge of the bed. Physical therapy without work with her. Withdraws briskly in all extremities from noxious stimuli   Pertinent Labs/Diagnostics:  EEG: Impression: This awake and drowsy EEG is abnormal due to the presence of:  1. Moderate diffuse slowing of the background with triphasic waves seen 2. Frequent right temporal sharp waves 3. Occasional left occipital sharp waves  Clinical Correlation of the above findings indicates diffuse cerebral dysfunction that is non-specific in etiology and can be seen with hypoxic/ischemic injury, toxic/metabolic encephalopathies, neurodegenerative disorders, or medication effect. Triphasic waves are typically seen with hepatic encephalopathy but can be seen with other metabolic encephalopathies as well. There is a possible tendency for seizures to arise from the right temporal and left occipital regions. There were no electrographic seizures seen in this study. Clinical correlation is advised.  MRI brain  IMPRESSION: 1. No acute or reversible intracranial process identified. 2. Temporal lobe predominant cerebral atrophy. 3. Mild for age chronic  microvascular ischemic disease  VPA level --12 (but only on 125 mg BID)    Etta Quill PA-C [facility] [phone]  Impression: 82 year old female with altered mental status after vomiting. Possibilities include syncope due to vagal event, hypoxia (patient was hypoxic on EMS arrival), seizure, stroke. MRI did not reveal any stroke. EEG did reveal sharps over the region of encephalomalacia but no seizure. Keppra 500 mg BID has been started. Will need to monitor for SE such as drowsiness or agitation. Primary team to address hypokalemia.    Recommend: 1-continue physical therapy 2- primary team to address metabolic abnormalities and electrolyte abnormalities 3- continue Keppra 500 mg twice a day and as stated above will need to evaluate and watch for agitation or significant sedation.  [place] to addend this note   02/28/2016, 8:58 AM

## 2016-02-28 NOTE — Progress Notes (Signed)
PHARMACY - PHYSICIAN COMMUNICATION CRITICAL VALUE ALERT - BLOOD CULTURE IDENTIFICATION (BCID)  ## Gram + Cocci in clusters in aerobic bottle, BCID with no detection ##  Results for orders placed or performed during the hospital encounter of 02/26/16  Blood Culture ID Panel (Reflexed) (Collected: 02/26/2016  5:20 PM)  Result Value Ref Range   Enterococcus species NOT DETECTED NOT DETECTED   Listeria monocytogenes NOT DETECTED NOT DETECTED   Staphylococcus species NOT DETECTED NOT DETECTED   Staphylococcus aureus NOT DETECTED NOT DETECTED   Streptococcus species NOT DETECTED NOT DETECTED   Streptococcus agalactiae NOT DETECTED NOT DETECTED   Streptococcus pneumoniae NOT DETECTED NOT DETECTED   Streptococcus pyogenes NOT DETECTED NOT DETECTED   Acinetobacter baumannii NOT DETECTED NOT DETECTED   Enterobacteriaceae species NOT DETECTED NOT DETECTED   Enterobacter cloacae complex NOT DETECTED NOT DETECTED   Escherichia coli NOT DETECTED NOT DETECTED   Klebsiella oxytoca NOT DETECTED NOT DETECTED   Klebsiella pneumoniae NOT DETECTED NOT DETECTED   Proteus species NOT DETECTED NOT DETECTED   Serratia marcescens NOT DETECTED NOT DETECTED   Haemophilus influenzae NOT DETECTED NOT DETECTED   Neisseria meningitidis NOT DETECTED NOT DETECTED   Pseudomonas aeruginosa NOT DETECTED NOT DETECTED   Candida albicans NOT DETECTED NOT DETECTED   Candida glabrata NOT DETECTED NOT DETECTED   Candida krusei NOT DETECTED NOT DETECTED   Candida parapsilosis NOT DETECTED NOT DETECTED   Candida tropicalis NOT DETECTED NOT DETECTED    Name of physician (or Provider) Contacted: Tylene Fantasia ([facility])  Changes to prescribed antibiotics required: Add vancomycin 500 mg IV q24h  Narda Bonds 02/28/2016  6:38 AM

## 2016-02-28 NOTE — Progress Notes (Signed)
Patient removed IV twice today.  Second IV inserted by RN. No Mitts available to discourage patient from pulling IV at this time.  On order at this time.  IV consult order put in. Patient refused ordered potassium po despite mixing in applesauce or nectar thick liquids. Multiple tries of encouragement.

## 2016-02-29 ENCOUNTER — Inpatient Hospital Stay (HOSPITAL_COMMUNITY): Payer: Medicare Other

## 2016-02-29 DIAGNOSIS — F320 Major depressive disorder, single episode, mild: Secondary | ICD-10-CM

## 2016-02-29 LAB — RESPIRATORY PANEL BY PCR
Adenovirus: NOT DETECTED
BORDETELLA PERTUSSIS-RVPCR: NOT DETECTED
CHLAMYDOPHILA PNEUMONIAE-RVPPCR: NOT DETECTED
Coronavirus 229E: NOT DETECTED
Coronavirus HKU1: NOT DETECTED
Coronavirus NL63: NOT DETECTED
Coronavirus OC43: NOT DETECTED
INFLUENZA A-RVPPCR: NOT DETECTED
INFLUENZA B-RVPPCR: NOT DETECTED
MYCOPLASMA PNEUMONIAE-RVPPCR: NOT DETECTED
Metapneumovirus: DETECTED — AB
PARAINFLUENZA VIRUS 4-RVPPCR: NOT DETECTED
Parainfluenza Virus 1: NOT DETECTED
Parainfluenza Virus 2: NOT DETECTED
Parainfluenza Virus 3: NOT DETECTED
RESPIRATORY SYNCYTIAL VIRUS-RVPPCR: NOT DETECTED
Rhinovirus / Enterovirus: NOT DETECTED

## 2016-02-29 LAB — BASIC METABOLIC PANEL
ANION GAP: 7 (ref 5–15)
BUN: 12 mg/dL (ref 6–20)
CALCIUM: 8.4 mg/dL — AB (ref 8.9–10.3)
CO2: 30 mmol/L (ref 22–32)
CREATININE: 0.79 mg/dL (ref 0.44–1.00)
Chloride: 99 mmol/L — ABNORMAL LOW (ref 101–111)
Glucose, Bld: 89 mg/dL (ref 65–99)
Potassium: 3.1 mmol/L — ABNORMAL LOW (ref 3.5–5.1)
Sodium: 136 mmol/L (ref 135–145)

## 2016-02-29 MED ORDER — LORAZEPAM 0.5 MG PO TABS
0.2500 mg | ORAL_TABLET | Freq: Every day | ORAL | 0 refills | Status: AC | PRN
Start: 2016-02-29 — End: ?

## 2016-02-29 MED ORDER — PANTOPRAZOLE SODIUM 40 MG PO PACK
40.0000 mg | PACK | Freq: Every day | ORAL | Status: DC
  Administered 2016-02-29: 40 mg via ORAL
  Filled 2016-02-29: qty 20

## 2016-02-29 MED ORDER — LEVETIRACETAM 100 MG/ML PO SOLN: 500.0000 mg | Freq: Two times a day (BID) | ORAL | 12 refills | Status: AC

## 2016-02-29 MED ORDER — AMLODIPINE BESYLATE 10 MG PO TABS: 10.0000 mg | ORAL_TABLET | Freq: Every day | ORAL | 1 refills | Status: AC

## 2016-02-29 MED ORDER — POTASSIUM CHLORIDE 20 MEQ/15ML (10%) PO SOLN: 20.0000 meq | Freq: Every day | ORAL | 0 refills | Status: AC

## 2016-02-29 MED ORDER — ENSURE ENLIVE PO LIQD: 237.0000 mL | Freq: Two times a day (BID) | ORAL | 12 refills | Status: AC

## 2016-02-29 MED ORDER — LEVETIRACETAM 100 MG/ML PO SOLN
500.0000 mg | Freq: Two times a day (BID) | ORAL | Status: DC
  Administered 2016-02-29: 500 mg via ORAL
  Filled 2016-02-29: qty 5

## 2016-02-29 MED ORDER — SENNOSIDES-DOCUSATE SODIUM 8.6-50 MG PO TABS: 1.0000 | ORAL_TABLET | Freq: Every evening | ORAL | 1 refills | Status: AC | PRN

## 2016-02-29 MED ORDER — CEFDINIR 300 MG PO CAPS: 300.0000 mg | ORAL_CAPSULE | Freq: Two times a day (BID) | ORAL | 0 refills | Status: AC

## 2016-02-29 MED ORDER — AZITHROMYCIN 500 MG PO TABS: 500.0000 mg | ORAL_TABLET | Freq: Every day | ORAL | 0 refills | Status: AC

## 2016-02-29 MED ORDER — HYDROCODONE-ACETAMINOPHEN 5-325 MG PO TABS: 1.0000 | ORAL_TABLET | Freq: Two times a day (BID) | ORAL | 0 refills | Status: AC | PRN

## 2016-02-29 NOTE — Discharge Summary (Signed)
Physician Discharge Summary  Tracy Reeves MRN: [id number] DOB/AGE: [birth year] 82 y.o.  PCP: [name], MD   Admit date: 02/26/2016 Discharge date: 02/29/2016  Discharge Diagnoses:    Active Problems:   Hypertension   Hypothyroid   Depression   Hypothyroidism due to acquired atrophy of thyroid   Essential hypertension   Altered mental status   Aphasia    Follow-up recommendations Follow-up with PCP in 3-5 days , including all  additional recommended appointments as below Follow-up CBC, CMP in 3-5 days Patient is not allowed to drive per neurology due to seizures       Current Discharge Medication List    START taking these medications   Details  azithromycin (ZITHROMAX) 500 MG tablet Take 1 tablet (500 mg total) by mouth daily. Qty: 4 tablet, Refills: 0    cefdinir (OMNICEF) 300 MG capsule Take 1 capsule (300 mg total) by mouth 2 (two) times daily. Qty: 8 capsule, Refills: 0    feeding supplement, ENSURE ENLIVE, (ENSURE ENLIVE) LIQD Take 237 mLs by mouth 2 (two) times daily between meals. Qty: 237 mL, Refills: 12    levETIRAcetam (KEPPRA) 100 MG/ML solution Take 5 mLs (500 mg total) by mouth 2 (two) times daily. Qty: 473 mL, Refills: 12    potassium chloride 20 MEQ/15ML (10%) SOLN Take 15 mLs (20 mEq total) by mouth daily. Qty: 450 mL, Refills: 0    senna-docusate (SENOKOT-S) 8.6-50 MG tablet Take 1 tablet by mouth at bedtime as needed for mild constipation. Qty: 30 tablet, Refills: 1      CONTINUE these medications which have CHANGED   Details  HYDROcodone-acetaminophen (NORCO/VICODIN) 5-325 MG tablet Take 1 tablet by mouth every 12 (twelve) hours as needed for moderate pain or severe pain. Qty: 10 tablet, Refills: 0    LORazepam (ATIVAN) 0.5 MG tablet Take 0.5 tablets (0.25 mg total) by mouth daily as needed for anxiety (agitation). Qty: 10 tablet, Refills: 0      CONTINUE these medications which have NOT CHANGED   Details  acetaminophen  (TYLENOL) 500 MG tablet Take 500 mg by mouth 2 (two) times daily.     amiodarone (PACERONE) 200 MG tablet Take 100 mg by mouth daily.     dextromethorphan-guaiFENesin (TUSSIN DM) 10-100 MG/5ML liquid Take 15 mLs by mouth 2 (two) times daily as needed for cough.    divalproex (DEPAKOTE SPRINKLE) 125 MG capsule Take 125 mg by mouth 2 (two) times daily.     omeprazole (PRILOSEC) 40 MG capsule Take 40 mg by mouth daily.    Polyethyl Glycol-Propyl Glycol (SYSTANE) 0.4-0.3 % GEL ophthalmic gel Place 1 application into both eyes daily.    polyethylene glycol (MIRALAX / GLYCOLAX) packet Take 17 g by mouth daily as needed for moderate constipation.     sertraline (ZOLOFT) 25 MG tablet Take 25 mg by mouth daily.    Norvasc 10 mg daily      Discharge Condition: Stable Discharge Instructions Get Medicines reviewed and adjusted: Please take all your medications with you for your next visit with your Primary MD  Please request your Primary MD to go over all hospital tests and procedure/radiological results at the follow up, please ask your Primary MD to get all Hospital records sent to his/her office.  If you experience worsening of your admission symptoms, develop shortness of breath, life threatening emergency, suicidal or homicidal thoughts you must seek medical attention immediately by calling 911 or calling your MD immediately if symptoms less severe.  You must read complete instructions/literature along with all the possible adverse reactions/side effects for all the Medicines you take and that have been prescribed to you. Take any new Medicines after you have completely understood and accpet all the possible adverse reactions/side effects.   Do not drive when taking Pain medications.   Do not take more than prescribed Pain, Sleep and Anxiety Medications  Special Instructions: If you have smoked or chewed Tobacco in the last 2 yrs please stop smoking, stop any regular Alcohol and or any  Recreational drug use.  Wear Seat belts while driving.  Please note  You were cared for by a hospitalist during your hospital stay. Once you are discharged, your primary care physician will handle any further medical issues. Please note that NO REFILLS for any discharge medications will be authorized once you are discharged, as it is imperative that you return to your primary care physician (or establish a relationship with a primary care physician if you do not have one) for your aftercare needs so that they can reassess your need for medications and monitor your lab values.  Discharge Instructions    Diet - low sodium heart healthy    Complete by:  As directed    Increase activity slowly    Complete by:  As directed        Allergies  Allergen Reactions  . Ace Inhibitors Other (See Comments)  . Other Other (See Comments)    Uncoded Allergy. Allergen: eggs, Other Reaction: GI Upset  . Eggs Or Egg-Derived Products Other (See Comments)    Unknown reaction      Disposition: 01-Home or Self Care   Consults: Neurology *    Significant Diagnostic Studies:  Ct Angio Head W Or Wo Contrast  Result Date: 02/26/2016 CLINICAL DATA:  82 year old female code stroke. Language deficit. Initial encounter. EXAM: CT ANGIOGRAPHY HEAD AND NECK TECHNIQUE: Multidetector CT imaging of the head and neck was performed using the standard protocol during bolus administration of intravenous contrast. Multiplanar CT image reconstructions and MIPs were obtained to evaluate the vascular anatomy. Carotid stenosis measurements (when applicable) are obtained utilizing NASCET criteria, using the distal internal carotid diameter as the denominator. CONTRAST:  50 mL Isovue 370. COMPARISON:  Head CT without contrast 1343 hours today. FINDINGS: CTA NECK Skeleton: Degenerative changes in the spine. Up to severe cervical facet arthropathy greater on the right. Exaggerated upper thoracic kyphosis. No acute osseous  abnormality identified. Visualized paranasal sinuses and mastoids are stable and well pneumatized. Upper chest: Negative lung apices. Negative visualized superior mediastinum. Other neck: Thyromegaly. Subcentimeter bilateral thyroid nodules. No significant mass effect on the airway. Negative larynx, pharynx, parapharyngeal spaces, retropharyngeal space, sublingual space, submandibular glands and parotid glands. No cervical lymphadenopathy. No acute orbit or scalp soft tissue findings. Aortic arch: 3 vessel arch configuration, the entire arch is not included. No great vessel origin stenosis suspected. Right carotid system: Negative right cervical carotids aside from tortuosity and mild soft and calcified plaque at the right carotid bifurcation. Left carotid system: Tortuous left CCA without stenosis proximal to the bifurcation. Mild to moderate soft and calcified plaque at the left carotid bifurcation. Stenosis is less than 50 % with respect to the distal vessel. Tortuous left ICA distal to the bulb. No stenosis. Vertebral arteries:New line no proximal right subclavian or right vertebral artery stenosis. Tortuous but otherwise negative cervical right vertebral artery. No proximal left subclavian artery stenosis despite soft plaque. Normal left vertebral artery origin. Tortuous left V1 segment.  No left vertebral artery stenosis in the neck. CTA HEAD Posterior circulation: Patent distal vertebral arteries without stenosis. Mild left V4 segment calcified plaque. Patent PICA origins. No basilar stenosis. Normal SCA and left PCA origins. Fetal type right PCA origin. Left posterior communicating artery diminutive or absent. Bilateral PCA branches are within normal limits. Anterior circulation: Both ICA siphons are patent. Mild to moderate calcified siphon atherosclerosis. Only mild subsequent supraclinoid segment stenosis bilaterally. Ophthalmic and right posterior communicating artery origins are normal. Patent carotid  termini. Normal MCA and ACA origins aside from mild calcified plaque. Diminutive or absent anterior communicating artery. Bilateral ACA branches are within normal limits. Right MCA branches are within normal limits. Left MCA M1 segment, left MCA bifurcation, and left MCA branches are within normal limits. Venous sinuses: Not well evaluated due to early contrast timing on these images. Anatomic variants: Fetal type right PCA origin. Review of the MIP images confirms the above findings IMPRESSION: 1. Negative for emergent large vessel occlusion. Preliminary report of this was relayed via text pager to Dr. Roland Rack on 02/26/2016 at 1407 hours. 2. Mild for age mostly calcified atherosclerosis in the head and neck. No hemodynamically significant arterial stenosis identified. 3. Thyromegaly compatible with benign goiter. Electronically Signed   By: Genevie Ann M.D.   On: 02/26/2016 14:17   Ct Angio Neck W Or Wo Contrast  Result Date: 02/26/2016 CLINICAL DATA:  82 year old female code stroke. Language deficit. Initial encounter. EXAM: CT ANGIOGRAPHY HEAD AND NECK TECHNIQUE: Multidetector CT imaging of the head and neck was performed using the standard protocol during bolus administration of intravenous contrast. Multiplanar CT image reconstructions and MIPs were obtained to evaluate the vascular anatomy. Carotid stenosis measurements (when applicable) are obtained utilizing NASCET criteria, using the distal internal carotid diameter as the denominator. CONTRAST:  50 mL Isovue 370. COMPARISON:  Head CT without contrast 1343 hours today. FINDINGS: CTA NECK Skeleton: Degenerative changes in the spine. Up to severe cervical facet arthropathy greater on the right. Exaggerated upper thoracic kyphosis. No acute osseous abnormality identified. Visualized paranasal sinuses and mastoids are stable and well pneumatized. Upper chest: Negative lung apices. Negative visualized superior mediastinum. Other neck: Thyromegaly.  Subcentimeter bilateral thyroid nodules. No significant mass effect on the airway. Negative larynx, pharynx, parapharyngeal spaces, retropharyngeal space, sublingual space, submandibular glands and parotid glands. No cervical lymphadenopathy. No acute orbit or scalp soft tissue findings. Aortic arch: 3 vessel arch configuration, the entire arch is not included. No great vessel origin stenosis suspected. Right carotid system: Negative right cervical carotids aside from tortuosity and mild soft and calcified plaque at the right carotid bifurcation. Left carotid system: Tortuous left CCA without stenosis proximal to the bifurcation. Mild to moderate soft and calcified plaque at the left carotid bifurcation. Stenosis is less than 50 % with respect to the distal vessel. Tortuous left ICA distal to the bulb. No stenosis. Vertebral arteries:New line no proximal right subclavian or right vertebral artery stenosis. Tortuous but otherwise negative cervical right vertebral artery. No proximal left subclavian artery stenosis despite soft plaque. Normal left vertebral artery origin. Tortuous left V1 segment. No left vertebral artery stenosis in the neck. CTA HEAD Posterior circulation: Patent distal vertebral arteries without stenosis. Mild left V4 segment calcified plaque. Patent PICA origins. No basilar stenosis. Normal SCA and left PCA origins. Fetal type right PCA origin. Left posterior communicating artery diminutive or absent. Bilateral PCA branches are within normal limits. Anterior circulation: Both ICA siphons are patent. Mild to moderate calcified siphon  atherosclerosis. Only mild subsequent supraclinoid segment stenosis bilaterally. Ophthalmic and right posterior communicating artery origins are normal. Patent carotid termini. Normal MCA and ACA origins aside from mild calcified plaque. Diminutive or absent anterior communicating artery. Bilateral ACA branches are within normal limits. Right MCA branches are within  normal limits. Left MCA M1 segment, left MCA bifurcation, and left MCA branches are within normal limits. Venous sinuses: Not well evaluated due to early contrast timing on these images. Anatomic variants: Fetal type right PCA origin. Review of the MIP images confirms the above findings IMPRESSION: 1. Negative for emergent large vessel occlusion. Preliminary report of this was relayed via text pager to Dr. Roland Rack on 02/26/2016 at 1407 hours. 2. Mild for age mostly calcified atherosclerosis in the head and neck. No hemodynamically significant arterial stenosis identified. 3. Thyromegaly compatible with benign goiter. Electronically Signed   By: Genevie Ann M.D.   On: 02/26/2016 14:17   Mr Brain Wo Contrast  Result Date: 02/27/2016 CLINICAL DATA:  Initial evaluation for acute on chronic altered mental status. Vomiting. EXAM: MRI HEAD WITHOUT CONTRAST TECHNIQUE: Multiplanar, multiecho pulse sequences of the brain and surrounding structures were obtained without intravenous contrast. COMPARISON:  Prior CTs from 02/26/2016. FINDINGS: Brain: Study moderately degraded by motion artifact. Diffuse prominence of the CSF containing spaces is compatible with generalized age-related cerebral atrophy. Atrophy most predominantly in the temporal lobes bilaterally with associated ex vacuo dilatation of the temporal horns of both lateral ventricles. Mild cerebellar atrophy noted as well. Patchy T2/FLAIR hyperintensity within the periventricular and deep white matter both cerebral hemispheres most like related to chronic microvascular ischemic disease, mild for age. No abnormal foci of restricted diffusion to suggest acute or subacute ischemia. Gray-white matter differentiation maintained. No evidence for acute or chronic intracranial hemorrhage. No encephalomalacia to suggest chronic infarction. No mass lesion, midline shift or mass effect. Mild ventricular prominence most like related to underlying cerebral atrophy. No  hydrocephalus. No extra-axial fluid collection. Major dural sinuses are grossly patent. Vascular: Major intracranial vascular flow voids are well preserved. Skull and upper cervical spine: Craniocervical junction within normal limits. Visualized upper cervical spine grossly unremarkable. Bone marrow signal intensity grossly normal. No scalp soft tissue abnormality. Sinuses/Orbits: Globes and orbital soft tissues grossly unremarkable on this motion degraded study. Mild scattered mucosal thickening within the paranasal sinuses. Small amount of fluid noted layering within the posterior nasopharynx. Trace opacity left mastoid air cells. IMPRESSION: 1. No acute or reversible intracranial process identified. 2. Temporal lobe predominant cerebral atrophy. 3. Mild for age chronic microvascular ischemic disease. Electronically Signed   By: Jeannine Boga M.D.   On: 02/27/2016 12:34   Dg Chest Port 1 View  Result Date: 02/27/2016 CLINICAL DATA:  Weakness.  Shortness of breath. EXAM: PORTABLE CHEST 1 VIEW COMPARISON:  February 26, 2016 FINDINGS: Prominent skin fold over the lateral left chest with lung markings on both sides. No pneumothorax. Mild atelectasis in the left base. No other change. IMPRESSION: No active disease. Electronically Signed   By: Dorise Bullion III M.D   On: 02/27/2016 12:47   Dg Chest Portable 1 View  Result Date: 02/26/2016 CLINICAL DATA:  Hypoxia. EXAM: PORTABLE CHEST 1 VIEW COMPARISON:  One-view chest x-ray 11/07/2014. FINDINGS: The heart is enlarged. Atherosclerotic calcifications are present at the aortic arch. There is no edema or effusion to suggest failure. No focal airspace disease is present. Emphysematous changes are again noted. A remote left humerus fracture is noted with probable pseudoarticulation. Advanced degenerative changes are  noted at the right shoulder. The visualized soft tissues and bony thorax are otherwise unremarkable. IMPRESSION: 1. No acute cardiopulmonary  disease. 2. Cardiomegaly without failure. 3. Aortic atherosclerosis. 4. Emphysema. 5. Remote left humerus fracture with probable pseudoarticulation. Electronically Signed   By: San Morelle M.D.   On: 02/26/2016 14:39   Ct Head Code Stroke W/o Cm  Result Date: 02/26/2016 CLINICAL DATA:  Code stroke. 82 year old female with aphasia. Last seen normal 1245 hours. Initial encounter. EXAM: CT HEAD WITHOUT CONTRAST TECHNIQUE: Contiguous axial images were obtained from the base of the skull through the vertex without intravenous contrast. COMPARISON:  Head CT without contrast 01/23/2016 and earlier. FINDINGS: Brain: Stable cerebral volume. No midline shift, mass effect, or evidence of intracranial mass lesion. Stable ventricle size and configuration. Confluent cerebral white matter hypodensity is stable. Small areas of cortical encephalomalacia along the lateral left temporal lobe and lateral left occipital lobe appear stable. More pronounced encephalomalacia along the anterior right temporal lobe appears stable. No acute intracranial hemorrhage identified. No cortically based acute infarct identified. Vascular: No suspicious intracranial vascular hyperdensity. Skull: No acute osseous abnormality identified. Sinuses/Orbits: Visualized paranasal sinuses and mastoids are stable and well pneumatized. Other: No acute orbit or scalp soft tissue finding. ASPECTS [facility] Stroke Program Early CT Score) - Ganglionic level infarction (caudate, lentiform nuclei, internal capsule, insula, M1-M3 cortex): 7 - Supraganglionic infarction (M4-M6 cortex): 3 Total score (0-10 with 10 being normal): 10 IMPRESSION: 1. No acute cortically based infarct or acute intracranial hemorrhage. 2. ASPECTS is 10. 3. Stable encephalomalacia along the anterior right temporal lobe and lateral left temporal and occipital lobes. 4. The above was relayed via text pager to Dr. Roland Rack on 02/26/2016 at 13:52 . Electronically Signed   By:  Genevie Ann M.D.   On: 02/26/2016 13:52        Filed Weights   02/26/16 2000 02/27/16 0500 02/28/16 0500  Weight: 50.1 kg (110 lb 7.2 oz) 50 kg (110 lb 3.7 oz) 54 kg (119 lb 0.8 oz)     Microbiology: Recent Results (from the past 240 hour(s))  Culture, blood (Routine X 2) w Reflex to ID Panel     Status: None (Preliminary result)   Collection Time: 02/26/16  5:20 PM  Result Value Ref Range Status   Specimen Description BLOOD LEFT ANTECUBITAL  Final   Special Requests BOTTLES DRAWN AEROBIC AND ANAEROBIC 5CC  Final   Culture  Setup Time   Final    GRAM POSITIVE COCCI IN CLUSTERS AEROBIC BOTTLE ONLY Organism ID to follow CRITICAL RESULT CALLED TO, READ BACK BY AND VERIFIED WITH: JAMES LEDFORD, PHARMD '@[id number]'$  02/28/16 MKELLY,MLT    Culture TOO YOUNG TO READ  Final   Report Status PENDING  Incomplete  Blood Culture ID Panel (Reflexed)     Status: None   Collection Time: 02/26/16  5:20 PM  Result Value Ref Range Status   Enterococcus species NOT DETECTED NOT DETECTED Final   Listeria monocytogenes NOT DETECTED NOT DETECTED Final   Staphylococcus species NOT DETECTED NOT DETECTED Final   Staphylococcus aureus NOT DETECTED NOT DETECTED Final   Streptococcus species NOT DETECTED NOT DETECTED Final   Streptococcus agalactiae NOT DETECTED NOT DETECTED Final   Streptococcus pneumoniae NOT DETECTED NOT DETECTED Final   Streptococcus pyogenes NOT DETECTED NOT DETECTED Final   Acinetobacter baumannii NOT DETECTED NOT DETECTED Final   Enterobacteriaceae species NOT DETECTED NOT DETECTED Final   Enterobacter cloacae complex NOT DETECTED NOT DETECTED Final   Escherichia coli  NOT DETECTED NOT DETECTED Final   Klebsiella oxytoca NOT DETECTED NOT DETECTED Final   Klebsiella pneumoniae NOT DETECTED NOT DETECTED Final   Proteus species NOT DETECTED NOT DETECTED Final   Serratia marcescens NOT DETECTED NOT DETECTED Final   Haemophilus influenzae NOT DETECTED NOT DETECTED Final   Neisseria  meningitidis NOT DETECTED NOT DETECTED Final   Pseudomonas aeruginosa NOT DETECTED NOT DETECTED Final   Candida albicans NOT DETECTED NOT DETECTED Final   Candida glabrata NOT DETECTED NOT DETECTED Final   Candida krusei NOT DETECTED NOT DETECTED Final   Candida parapsilosis NOT DETECTED NOT DETECTED Final   Candida tropicalis NOT DETECTED NOT DETECTED Final  Culture, blood (Routine X 2) w Reflex to ID Panel     Status: None (Preliminary result)   Collection Time: 02/26/16  5:25 PM  Result Value Ref Range Status   Specimen Description BLOOD LEFT WRIST  Final   Special Requests BOTTLES DRAWN AEROBIC AND ANAEROBIC 5CC  Final   Culture NO GROWTH 2 DAYS  Final   Report Status PENDING  Incomplete       Blood Culture    Component Value Date/Time   SDES BLOOD LEFT WRIST 02/26/2016 1725   SPECREQUEST BOTTLES DRAWN AEROBIC AND ANAEROBIC 5CC 02/26/2016 1725   CULT NO GROWTH 2 DAYS 02/26/2016 1725   REPTSTATUS PENDING 02/26/2016 1725      Labs: Results for orders placed or performed during the hospital encounter of 02/26/16 (from the past 48 hour(s))  CBC     Status: Abnormal   Collection Time: 02/28/16  5:53 AM  Result Value Ref Range   WBC 7.8 4.0 - 10.5 K/uL   RBC 4.15 3.87 - 5.11 MIL/uL   Hemoglobin 12.0 12.0 - 15.0 g/dL   HCT 38.4 36.0 - 46.0 %   MCV 92.5 78.0 - 100.0 fL   MCH 28.9 26.0 - 34.0 pg   MCHC 31.3 30.0 - 36.0 g/dL   RDW 15.6 (H) 11.5 - 15.5 %   Platelets 174 150 - 400 K/uL  Comprehensive metabolic panel     Status: Abnormal   Collection Time: 02/28/16  5:53 AM  Result Value Ref Range   Sodium 139 135 - 145 mmol/L   Potassium 3.1 (L) 3.5 - 5.1 mmol/L   Chloride 102 101 - 111 mmol/L   CO2 27 22 - 32 mmol/L   Glucose, Bld 81 65 - 99 mg/dL   BUN 17 6 - 20 mg/dL   Creatinine, Ser 0.85 0.44 - 1.00 mg/dL   Calcium 8.4 (L) 8.9 - 10.3 mg/dL   Total Protein 5.7 (L) 6.5 - 8.1 g/dL   Albumin 2.9 (L) 3.5 - 5.0 g/dL   AST 18 15 - 41 U/L   ALT 13 (L) 14 - 54 U/L    Alkaline Phosphatase 44 38 - 126 U/L   Total Bilirubin 0.8 0.3 - 1.2 mg/dL   GFR calc non Af Amer 58 (L) >60 mL/min   GFR calc Af Amer >60 >60 mL/min    Comment: (NOTE) The eGFR has been calculated using the CKD EPI equation. This calculation has not been validated in all clinical situations. eGFR's persistently <60 mL/min signify possible Chronic Kidney Disease.    Anion gap 10 5 - 15  Basic metabolic panel     Status: Abnormal   Collection Time: 02/29/16  2:03 AM  Result Value Ref Range   Sodium 136 135 - 145 mmol/L   Potassium 3.1 (L) 3.5 - 5.1 mmol/L  Chloride 99 (L) 101 - 111 mmol/L   CO2 30 22 - 32 mmol/L   Glucose, Bld 89 65 - 99 mg/dL   BUN 12 6 - 20 mg/dL   Creatinine, Ser 0.79 0.44 - 1.00 mg/dL   Calcium 8.4 (L) 8.9 - 10.3 mg/dL   GFR calc non Af Amer >60 >60 mL/min   GFR calc Af Amer >60 >60 mL/min    Comment: (NOTE) The eGFR has been calculated using the CKD EPI equation. This calculation has not been validated in all clinical situations. eGFR's persistently <60 mL/min signify possible Chronic Kidney Disease.    Anion gap 7 5 - 15     Lipid Panel  No results found for: CHOL, TRIG, HDL, CHOLHDL, VLDL, LDLCALC, LDLDIRECT   No results found for: HGBA1C      HPI :  82 y.o. female a history of fairly advanced dementia as well as some difficulty with speech (daughter describes word salad) at baseline who presents after an episode of altered mental status. She apparently had emesis after saying that she did not feel well at lunchtime and following this became essentially unresponsive. She was transported by EMS reactivated code stroke and on arrival was not speaking very much. Over the course of her initial evaluation, she did begin speaking more the time of her daughter's arrival she seems pretty much back to baseline.  HOSPITAL COURSE:   Altered mental status/near syncope Etiology unclear. Concern for seizures versus TIA  EkG NAD No hypoxemia  CT of the  head without acute abnormality. MRI No acute or reversible intracranial process identified . Afebrile WBC 9.7  Neuro unconvinced this is stroke related  EKG within normal limits Ammonia levels 25 UA negative MRI negative for acute CVA EEG- Moderate diffuse slowing of the background with triphasic waves seen SLP  Dysphagia 1 (Puree);Nectar-thick liquid  Avoid medications that can make her sleepy, minimize benzodiazepines   Acute bronchitis No fever since yesterday Started on azithromycin and Rocephin, now switched to azithromycin and Omnicef for 4 more days resp panel pending  Hypertension   Uncontrolled, added Norvasc,   Continue home anti-hypertensive medications when able to swallow  Hypothyroidism: -Continue home Synthroid  Atrial Fibrillation  Rate controlled, continue amiodarone -Continue meds  Histroy of Seizures on Depakote, - Depakote level 12, now  receiving Keppra also as per neurology - Seizure precautions No driving Depression Continue home [place]      Discharge Exam:   Blood pressure (!) 181/74, pulse 60, temperature 97.7 F (36.5 C), temperature source Oral, resp. rate 18, height 5' (1.524 m), weight 54 kg (119 lb 0.8 oz), SpO2 100 %.  General exam: Appears calm and comfortable  Respiratory system: Clear to auscultation. Respiratory effort normal. Cardiovascular system: S1 & S2 heard, RRR. No JVD, murmurs, rubs, gallops or clicks. No pedal edema. Gastrointestinal system: Abdomen is nondistended, soft and nontender. No organomegaly or masses felt. Normal bowel sounds heard. Central nervous system: Alert and oriented. No focal neurological deficits. Extremities: Symmetric 5 x 5 power. Skin: No rashes, lesions or ulcers Psychiatry: Judgement and insight appear normal. Mood & affect appropriate.      Follow-up Information    Sarina Ser, Hewitt Blade, MD. Call in 2 day(s).   Specialty:  Internal Medicine Why:  Hospital follow-up, check CBC,  BMP Contact information: [place] [address] [phone]           Signed: Reyne Dumas 02/29/2016, 10:00 AM  Time spent >45 mins   

## 2016-02-29 NOTE — Progress Notes (Signed)
Patient will DC to: [facility] Anticipated DC date: 02/29/16 Family notified: Diane Transport by: Corey Harold 12:30pm   Per MD patient ready for DC to [facility]. RN, patient, patient's family, and facility notified of DC. Discharge Summary sent to facility. RN given number for report. DC packet on chart. Ambulance transport requested for patient.   CSW signing off.  Cedric Fishman, [place] Social Worker [phone]

## 2016-02-29 NOTE — NC FL2 (Signed)
[place] LEVEL OF CARE SCREENING TOOL     IDENTIFICATION  Patient Name: Tracy Reeves Birthdate: 18-Mar-1923 Sex: female Admission Date (Current Location): 02/26/2016  [facility] [place] Number:  Herbalist and Address:  [facility] [place], [place] [address] [place], [place] [address]      Provider Number: [id number]  Attending Physician Name and Address:  Reyne Dumas, MD  Relative Name and Phone Number:  Shauna Hugh, daughter, [phone]    Current Level of Care: Hospital Recommended Level of Care: Memory Care Prior Approval Number:    Date Approved/Denied:   PASRR Number:    Discharge Plan: Other (Comment) (Memory Care)    Current Diagnoses: Patient Active Problem List   Diagnosis Date Noted  . Aphasia   . Altered mental status 02/26/2016  . Seizures ([place])   . Syncope   . Fracture closed, humerus 09/04/2014  . Proximal humeral fracture   . Hygroma   . Humeral surgical neck fracture 09/03/2014  . V tach ([place]) 09/03/2014  . Subdural hygroma 09/03/2014  . Laceration of scalp without complication   . Syncope and collapse 08/31/2014  . Depression 07/01/2014  . Hypothyroidism due to acquired atrophy of thyroid 07/01/2014  . Essential hypertension 07/01/2014  . Vascular dementia without behavioral disturbance 06/19/2014  . Hypertension   . Hypothyroid   . Fall 05/23/2014  . TBI (traumatic brain injury) ([place]) 05/20/2014    Orientation RESPIRATION BLADDER Height & Weight     Self  Normal Incontinent Weight: 54 kg (119 lb 0.8 oz) Height:  5' (152.4 cm)  BEHAVIORAL SYMPTOMS/MOOD NEUROLOGICAL BOWEL NUTRITION STATUS      Incontinent  (Dysphagia 1-nectar thick)  AMBULATORY STATUS COMMUNICATION OF NEEDS Skin   Extensive Assist Verbally Normal                       Personal Care Assistance Level of Assistance  Bathing, Feeding, Dressing Bathing Assistance: Maximum assistance Feeding assistance: Limited  assistance Dressing Assistance: Limited assistance     Functional Limitations Info             SPECIAL CARE FACTORS FREQUENCY  PT (By licensed PT)     PT Frequency: min 3x/week              Contractures      Additional Factors Info  Code Status, Allergies, Isolation Precautions Code Status Info: DNR Allergies Info: Ace Inhibitors, Other, Eggs Or Egg-derived Products     Isolation Precautions Info: Droplet Precautions     Current Medications (02/29/2016):   Discharge Medications: Current Discharge Medication List       START taking these medications   Details  azithromycin (ZITHROMAX) 500 MG tablet Take 1 tablet (500 mg total) by mouth daily. Qty: 4 tablet, Refills: 0    cefdinir (OMNICEF) 300 MG capsule Take 1 capsule (300 mg total) by mouth 2 (two) times daily. Qty: 8 capsule, Refills: 0    feeding supplement, ENSURE ENLIVE, (ENSURE ENLIVE) LIQD Take 237 mLs by mouth 2 (two) times daily between meals. Qty: 237 mL, Refills: 12    levETIRAcetam (KEPPRA) 100 MG/ML solution Take 5 mLs (500 mg total) by mouth 2 (two) times daily. Qty: 473 mL, Refills: 12    potassium chloride 20 MEQ/15ML (10%) SOLN Take 15 mLs (20 mEq total) by mouth daily. Qty: 450 mL, Refills: 0    senna-docusate (SENOKOT-S) 8.6-50 MG tablet Take 1 tablet by mouth at bedtime as needed for  mild constipation. Qty: 30 tablet, Refills: 1         CONTINUE these medications which have CHANGED   Details  HYDROcodone-acetaminophen (NORCO/VICODIN) 5-325 MG tablet Take 1 tablet by mouth every 12 (twelve) hours as needed for moderate pain or severe pain. Qty: 10 tablet, Refills: 0    LORazepam (ATIVAN) 0.5 MG tablet Take 0.5 tablets (0.25 mg total) by mouth daily as needed for anxiety (agitation). Qty: 10 tablet, Refills: 0         CONTINUE these medications which have NOT CHANGED   Details  acetaminophen (TYLENOL) 500 MG tablet Take 500 mg by mouth 2 (two) times daily.      amiodarone (PACERONE) 200 MG tablet Take 100 mg by mouth daily.     dextromethorphan-guaiFENesin (TUSSIN DM) 10-100 MG/5ML liquid Take 15 mLs by mouth 2 (two) times daily as needed for cough.    divalproex (DEPAKOTE SPRINKLE) 125 MG capsule Take 125 mg by mouth 2 (two) times daily.     omeprazole (PRILOSEC) 40 MG capsule Take 40 mg by mouth daily.    Polyethyl Glycol-Propyl Glycol (SYSTANE) 0.4-0.3 % GEL ophthalmic gel Place 1 application into both eyes daily.    polyethylene glycol (MIRALAX / GLYCOLAX) packet Take 17 g by mouth daily as needed for moderate constipation.     sertraline (ZOLOFT) 25 MG tablet Take 25 mg by mouth daily.    Norvasc 10 mg daily      Relevant Imaging Results:  Relevant Lab Results:   Additional Information SSN: [place]  Aldan Spencer, [place]

## 2016-02-29 NOTE — Progress Notes (Signed)
Pt d/c to snf by ambulance. Assessment stable. Attempted report. No answer. Report given to EMS.

## 2016-02-29 NOTE — Clinical Social Work Note (Signed)
Clinical Social Work Assessment  Patient Details  Name: Tracy Reeves MRN: [id number] Date of Birth: 09/09/1922  Date of referral:  02/29/16               Reason for consult:  Discharge Planning                Permission sought to share information with:  Facility Sport and exercise psychologist, Family Supports Permission granted to share information::  No (Disoriented)  Name::     Diane  Agency::  [facility]  Relationship::  Daughter  Contact Information:  [phone]  Housing/Transportation Living arrangements for the past 2 months:  [place] of Information:  Adult Children Patient Interpreter Needed:  None Criminal Activity/Legal Involvement Pertinent to Current Situation/Hospitalization:  No - Comment as needed Significant Relationships:  Adult Children Lives with:  Facility Resident Do you feel safe going back to the place where you live?  Yes Need for family participation in patient care:  Yes (Comment)  Care giving concerns:  CSW received consult regarding discharge planning. Patient has dementia. CSW spoke with patient's daughter. Patient is from [facility]. Patient's daughter would like her to return there at discharge. CSW to continue to follow for discharge needs.    Social Worker assessment / plan:  Patient to return to [facility] at discharge.  Employment status:  Retired Nurse, adult PT Recommendations:  Home with [place] / Referral to community resources:     Patient/Family's Response to care: Patient's daughter reported concerns about all the testing that the hospital was ordering since she already knows that her mother has dementia and doesn't know who she is. Patient's daughter stated she can come pick patient up once her husband's caregiver arrives for a few hours, but may have trouble leaving him after that. CSW provided supportive listening and offered a non-emergency  ambulance as an alternative transportation method.   Patient/Family's Understanding of and Emotional Response to Diagnosis, Current Treatment, and Prognosis:  Patient/family is realistic regarding therapy needs and expressed being hopeful for discharge. Patient's daughter expressed understanding of CSW role and discharge process. No questions/concerns about plan or treatment.    Emotional Assessment Appearance:  Appears stated age Attitude/Demeanor/Rapport:  Unable to Assess Affect (typically observed):  Unable to Assess Orientation:  Oriented to Self Alcohol / Substance use:  Not Applicable Psych involvement (Current and /or in the community):  No (Comment)  Discharge Needs  Concerns to be addressed:  Care Coordination Readmission within the last 30 days:  No Current discharge risk:  None Barriers to Discharge:  Continued Medical Work up   [employer], [place] 02/29/2016, 8:17 AM

## 2016-03-01 LAB — CULTURE, BLOOD (ROUTINE X 2)

## 2016-03-02 LAB — CULTURE, BLOOD (ROUTINE X 2): CULTURE: NO GROWTH

## 2016-05-12 IMAGING — CT CT CERVICAL SPINE W/O CM
3 of 5 series · 13 of 33 positions shown, 16 images · non-contrast
Comparison: 07/30/2014, 08/31/2014, 09/03/2014, 09/28/2014,
11/07/2014, 12/17/2014, 12/27/2014

CLINICAL DATA: Dementia, fell several days ago

EXAM:
CT HEAD WITHOUT CONTRAST
CT CERVICAL SPINE WITHOUT CONTRAST
TECHNIQUE: Multidetector CT imaging of the head and cervical spine was
performed following the standard protocol without intravenous
contrast. Multiplanar CT image reconstructions of the cervical spine
were also generated.

[Series 3: c_spine 2.0 i30s 3 · axial · 0.32mm/px · z∈[-298,-178]mm · 5 of 90 slices shown, 7 images]
[im 15/90  soft-tissue]
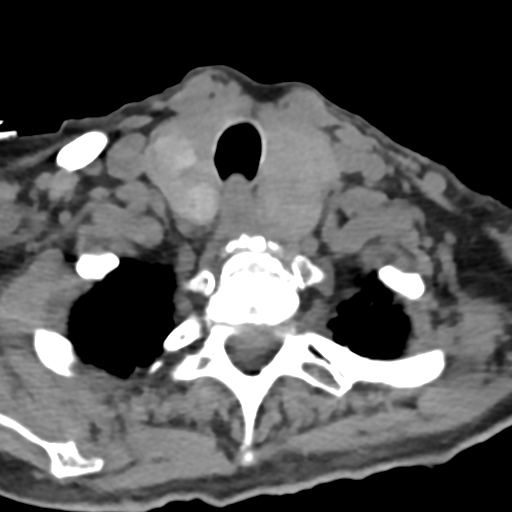
[im 15/90  bone]
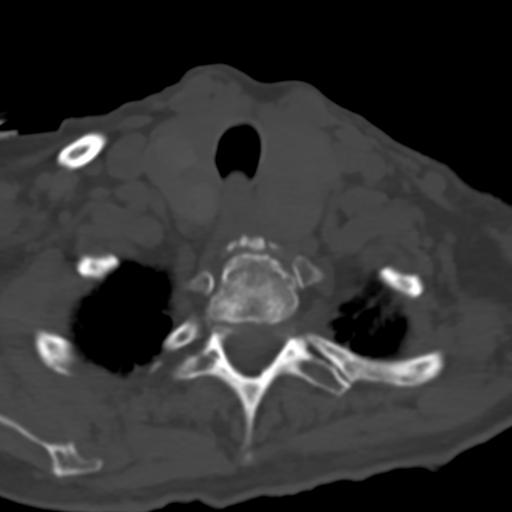
[im 30/90  bone]
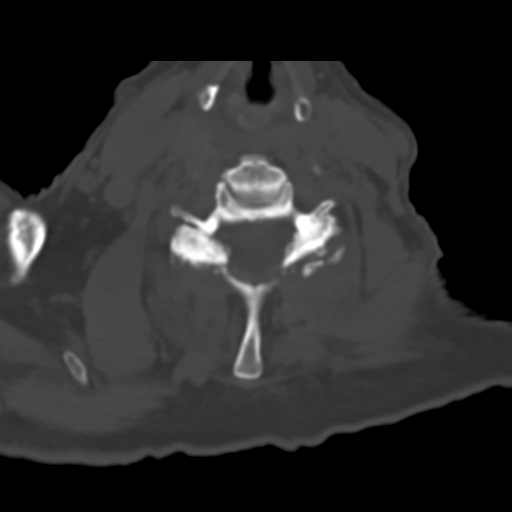
[im 45/90  bone]
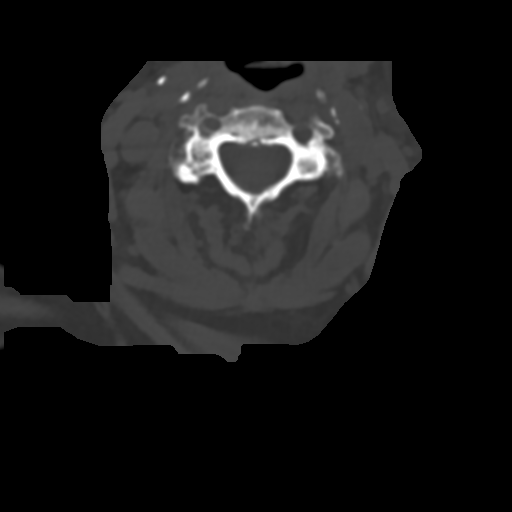
[im 60/90  bone]
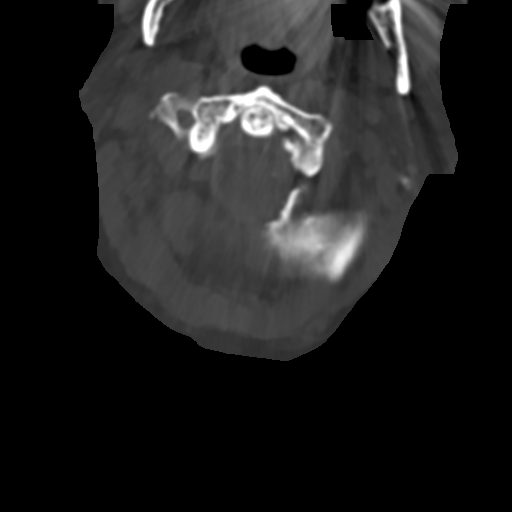
[im 75/90  soft-tissue]
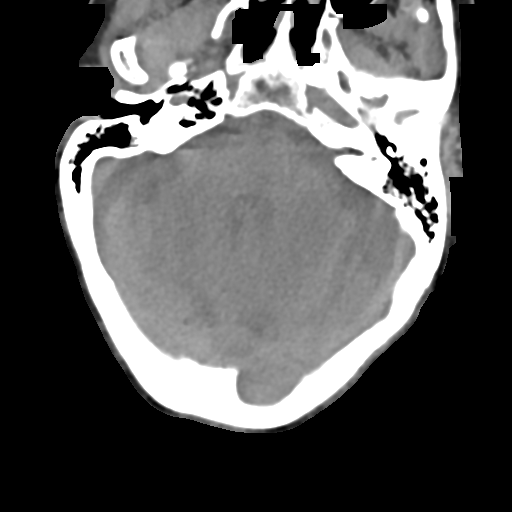
[im 75/90  bone]
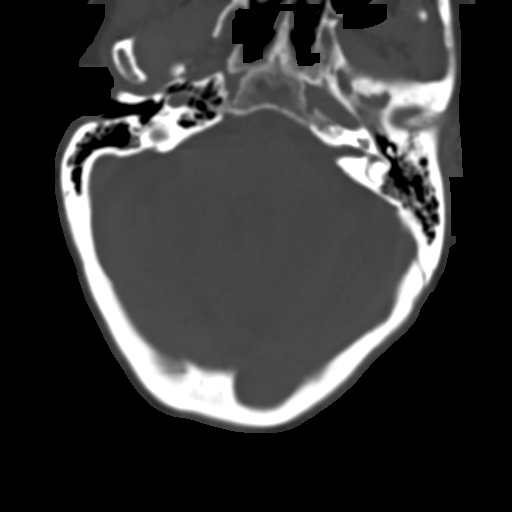

[Series 6: coronals lower · coronal · 0.26mm/px · 3 of 49 slices shown]
[im 3/49  bone]
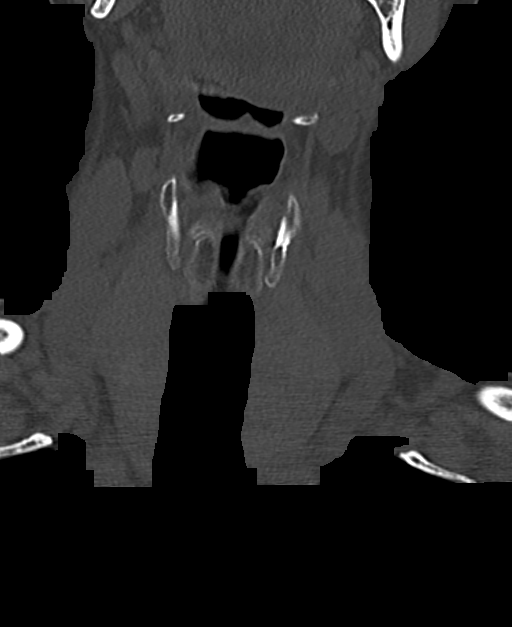
[im 25/49  bone]
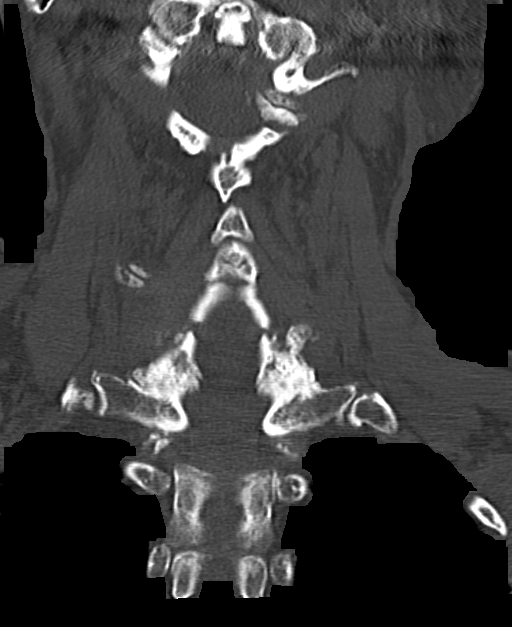
[im 47/49  bone]
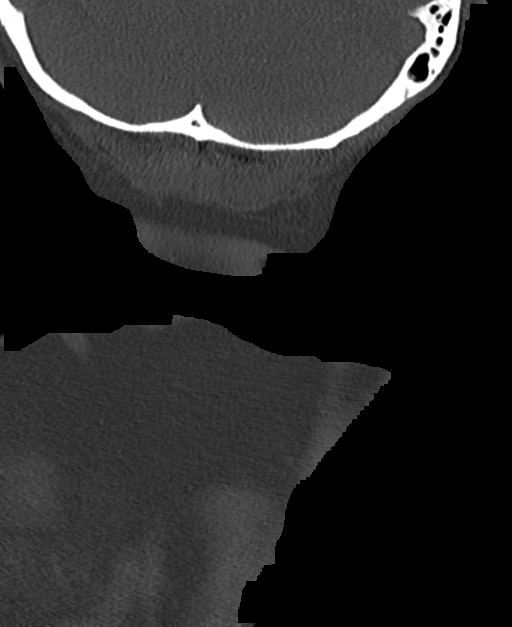

[Series 7: sagittals · sagittal · 0.36mm/px · 5 of 48 slices shown, 6 images]
[im 16/48  bone]
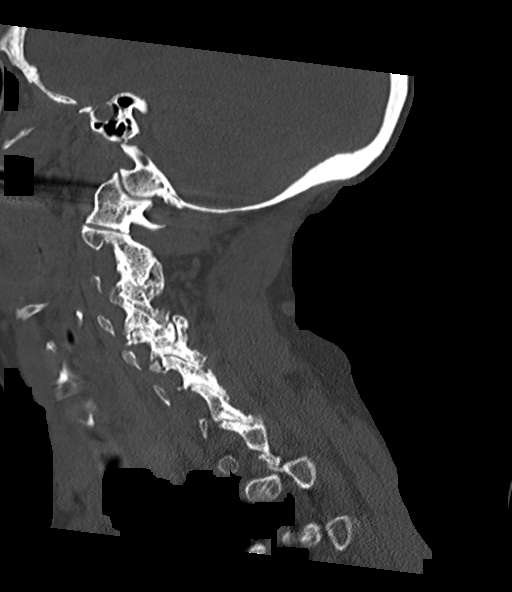
[im 20/48  bone]
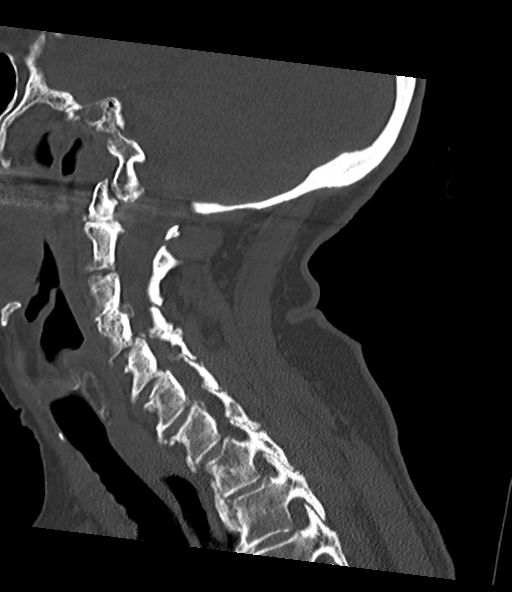
[im 24/48  soft-tissue]
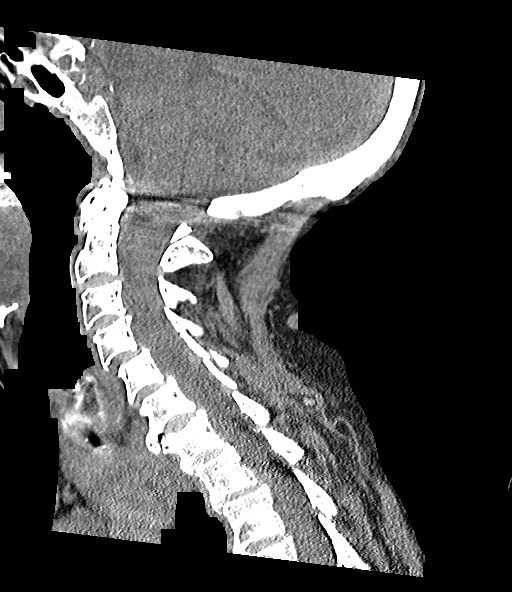
[im 24/48  bone]
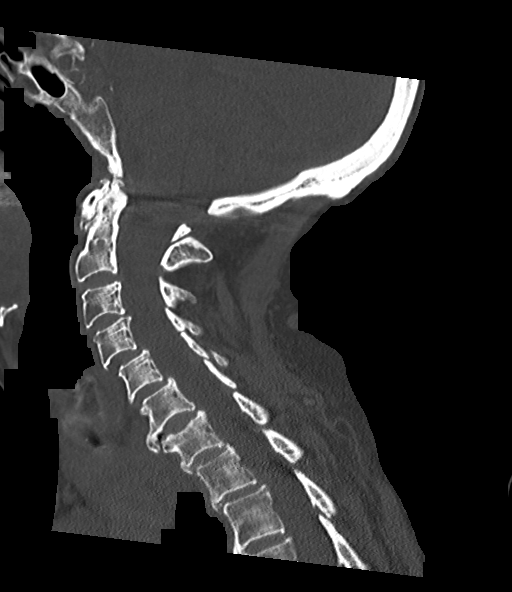
[im 28/48  bone]
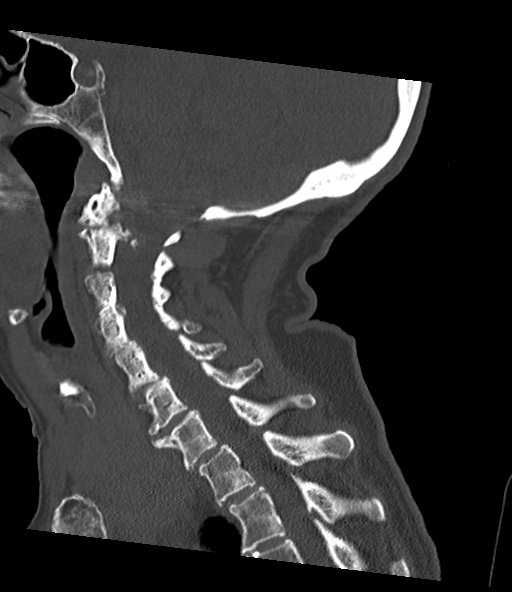
[im 32/48  bone]
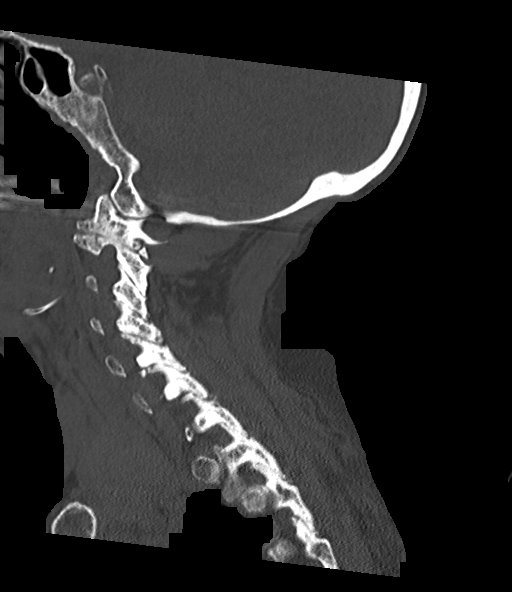

[13 of 33 positions shown; findings below may reference images not displayed]

FINDINGS: CT HEAD FINDINGS

There is no evidence of mass effect, midline shift, or extra-axial
fluid collections. There is no evidence of a space-occupying lesion
or intracranial hemorrhage. There is no evidence of a cortical-based
area of acute infarction. There is generalized cerebral atrophy.
There is periventricular white matter low attenuation likely
secondary to microangiopathy.

The ventricles and sulci are appropriate for the patient's age. The
basal cisterns are patent.

Visualized portions of the orbits are unremarkable. The visualized
portions of the paranasal sinuses and mastoid air cells are
unremarkable. Cerebrovascular atherosclerotic calcifications are
noted.

The osseous structures are unremarkable. There is a left frontal and
left parietal scalp hematoma.

CT CERVICAL SPINE FINDINGS

The alignment is anatomic. The vertebral body heights are
maintained. Again noted is a subtle nondisplaced fracture involving
the left lateral mass of C2 extending into the foramen transverse
transversarium. There is no acute fracture. There is no static
listhesis. The prevertebral soft tissues are normal. The intraspinal
soft tissues are not fully imaged on this examination due to poor
soft tissue contrast, but there is no gross soft tissue abnormality.

There is 4 mm of anterolisthesis of C4 on C5 secondary to facet
arthropathy. There is 3 mm of anterolisthesis of C5 on C6 secondary
to bilateral facet arthropathy. There is degenerative disc disease
at C6-C7. There is bilateral facet arthropathy throughout the
cervical spine. Bilateral uncovertebral degenerative changes at C3-4
resulting in right foraminal stenosis. Bilateral uncovertebral
degenerative changes at C4-5 with mild right foraminal stenosis.

The visualized portions of the lung apices demonstrate no focal
abnormality.
IMPRESSION: 1. No acute intracranial pathology.
2. No acute osseous injury of the cervical spine.
3. Cervical spine spondylosis as described above.
4. Again noted is a subtle nondisplaced fracture involving the left
lateral mass of C2 extending into the foramen transverse
transversarium.

## 2017-01-21 DEATH — deceased
